# Patient Record
Sex: Female | Born: 1935
Health system: Southern US, Community
[De-identification: ages and names within clinical notes are randomized; demographics above are authoritative.]

## PROBLEM LIST (undated history)

## (undated) DIAGNOSIS — K219 Gastro-esophageal reflux disease without esophagitis: Secondary | ICD-10-CM

## (undated) DIAGNOSIS — E785 Hyperlipidemia, unspecified: Secondary | ICD-10-CM

## (undated) DIAGNOSIS — I10 Essential (primary) hypertension: Secondary | ICD-10-CM

## (undated) DIAGNOSIS — D649 Anemia, unspecified: Secondary | ICD-10-CM

## (undated) DIAGNOSIS — H35039 Hypertensive retinopathy, unspecified eye: Secondary | ICD-10-CM

## (undated) DIAGNOSIS — I2510 Atherosclerotic heart disease of native coronary artery without angina pectoris: Secondary | ICD-10-CM

## (undated) DIAGNOSIS — I739 Peripheral vascular disease, unspecified: Secondary | ICD-10-CM

## (undated) DIAGNOSIS — E119 Type 2 diabetes mellitus without complications: Secondary | ICD-10-CM

## (undated) DIAGNOSIS — H3530 Unspecified macular degeneration: Secondary | ICD-10-CM

## (undated) DIAGNOSIS — I779 Disorder of arteries and arterioles, unspecified: Secondary | ICD-10-CM

## (undated) HISTORY — DX: Essential (primary) hypertension: I10

## (undated) HISTORY — DX: Type 2 diabetes mellitus without complications: E11.9

## (undated) HISTORY — PX: ABDOMINAL HYSTERECTOMY: SHX81

## (undated) HISTORY — PX: EYE SURGERY: SHX253

## (undated) HISTORY — DX: Gastro-esophageal reflux disease without esophagitis: K21.9

## (undated) HISTORY — PX: OTHER SURGICAL HISTORY: SHX169

## (undated) HISTORY — PX: CATARACT EXTRACTION: SUR2

## (undated) HISTORY — DX: Anemia, unspecified: D64.9

## (undated) HISTORY — DX: Hyperlipidemia, unspecified: E78.5

## (undated) HISTORY — PX: APPENDECTOMY: SHX54

## (undated) HISTORY — DX: Unspecified macular degeneration: H35.30

## (undated) HISTORY — DX: Hypertensive retinopathy, unspecified eye: H35.039

---

## 1999-09-07 ENCOUNTER — Encounter: Payer: Self-pay | Admitting: Internal Medicine

## 1999-09-07 ENCOUNTER — Encounter: Admission: RE | Admit: 1999-09-07 | Discharge: 1999-09-07 | Payer: Self-pay | Admitting: Internal Medicine

## 2001-04-06 ENCOUNTER — Encounter: Admission: RE | Admit: 2001-04-06 | Discharge: 2001-04-06 | Payer: Self-pay | Admitting: Internal Medicine

## 2001-04-06 ENCOUNTER — Encounter: Payer: Self-pay | Admitting: Internal Medicine

## 2001-05-04 ENCOUNTER — Other Ambulatory Visit: Admission: RE | Admit: 2001-05-04 | Discharge: 2001-05-04 | Payer: Self-pay | Admitting: Internal Medicine

## 2002-03-28 ENCOUNTER — Encounter: Payer: Self-pay | Admitting: Internal Medicine

## 2002-03-28 ENCOUNTER — Encounter: Admission: RE | Admit: 2002-03-28 | Discharge: 2002-03-28 | Payer: Self-pay | Admitting: Family Medicine

## 2003-04-26 ENCOUNTER — Encounter: Admission: RE | Admit: 2003-04-26 | Discharge: 2003-04-26 | Payer: Self-pay | Admitting: Internal Medicine

## 2003-10-22 ENCOUNTER — Emergency Department (HOSPITAL_COMMUNITY): Admission: EM | Admit: 2003-10-22 | Discharge: 2003-10-22 | Payer: Self-pay | Admitting: Emergency Medicine

## 2004-05-21 ENCOUNTER — Encounter: Admission: RE | Admit: 2004-05-21 | Discharge: 2004-05-21 | Payer: Self-pay | Admitting: Internal Medicine

## 2005-05-24 ENCOUNTER — Encounter: Admission: RE | Admit: 2005-05-24 | Discharge: 2005-05-24 | Payer: Self-pay | Admitting: Internal Medicine

## 2006-05-25 ENCOUNTER — Encounter: Admission: RE | Admit: 2006-05-25 | Discharge: 2006-05-25 | Payer: Self-pay | Admitting: Internal Medicine

## 2007-05-29 ENCOUNTER — Encounter: Admission: RE | Admit: 2007-05-29 | Discharge: 2007-05-29 | Payer: Self-pay | Admitting: Internal Medicine

## 2007-11-15 ENCOUNTER — Ambulatory Visit: Payer: Self-pay | Admitting: Cardiology

## 2007-11-15 ENCOUNTER — Inpatient Hospital Stay (HOSPITAL_COMMUNITY): Admission: EM | Admit: 2007-11-15 | Discharge: 2007-11-17 | Payer: Self-pay | Admitting: Emergency Medicine

## 2007-11-22 ENCOUNTER — Ambulatory Visit: Payer: Self-pay

## 2007-11-29 ENCOUNTER — Ambulatory Visit: Payer: Self-pay | Admitting: Cardiology

## 2007-12-15 ENCOUNTER — Ambulatory Visit: Payer: Self-pay

## 2008-06-04 ENCOUNTER — Encounter: Admission: RE | Admit: 2008-06-04 | Discharge: 2008-06-04 | Payer: Self-pay | Admitting: Internal Medicine

## 2008-07-16 ENCOUNTER — Observation Stay (HOSPITAL_COMMUNITY): Admission: EM | Admit: 2008-07-16 | Discharge: 2008-07-17 | Payer: Self-pay | Admitting: Emergency Medicine

## 2008-07-16 ENCOUNTER — Ambulatory Visit: Payer: Self-pay | Admitting: Cardiology

## 2008-07-17 ENCOUNTER — Encounter (INDEPENDENT_AMBULATORY_CARE_PROVIDER_SITE_OTHER): Payer: Self-pay | Admitting: Internal Medicine

## 2008-07-23 DIAGNOSIS — I10 Essential (primary) hypertension: Secondary | ICD-10-CM | POA: Insufficient documentation

## 2008-07-23 DIAGNOSIS — K219 Gastro-esophageal reflux disease without esophagitis: Secondary | ICD-10-CM | POA: Insufficient documentation

## 2008-07-23 DIAGNOSIS — E119 Type 2 diabetes mellitus without complications: Secondary | ICD-10-CM | POA: Insufficient documentation

## 2008-07-23 DIAGNOSIS — M810 Age-related osteoporosis without current pathological fracture: Secondary | ICD-10-CM | POA: Insufficient documentation

## 2008-07-23 DIAGNOSIS — R0789 Other chest pain: Secondary | ICD-10-CM | POA: Insufficient documentation

## 2008-07-23 DIAGNOSIS — H3530 Unspecified macular degeneration: Secondary | ICD-10-CM | POA: Insufficient documentation

## 2008-07-23 DIAGNOSIS — E785 Hyperlipidemia, unspecified: Secondary | ICD-10-CM | POA: Insufficient documentation

## 2008-07-23 DIAGNOSIS — Z862 Personal history of diseases of the blood and blood-forming organs and certain disorders involving the immune mechanism: Secondary | ICD-10-CM | POA: Insufficient documentation

## 2008-07-23 DIAGNOSIS — Z8639 Personal history of other endocrine, nutritional and metabolic disease: Secondary | ICD-10-CM

## 2008-07-24 ENCOUNTER — Encounter: Payer: Self-pay | Admitting: Physician Assistant

## 2008-07-24 ENCOUNTER — Ambulatory Visit: Payer: Self-pay | Admitting: Cardiology

## 2008-11-11 ENCOUNTER — Encounter: Payer: Self-pay | Admitting: Cardiology

## 2008-12-12 ENCOUNTER — Encounter: Payer: Self-pay | Admitting: Cardiology

## 2008-12-18 ENCOUNTER — Ambulatory Visit: Payer: Self-pay

## 2008-12-18 ENCOUNTER — Encounter: Payer: Self-pay | Admitting: Cardiology

## 2008-12-20 ENCOUNTER — Ambulatory Visit: Payer: Self-pay | Admitting: Oncology

## 2008-12-25 ENCOUNTER — Ambulatory Visit: Payer: Self-pay | Admitting: Cardiology

## 2008-12-25 DIAGNOSIS — I080 Rheumatic disorders of both mitral and aortic valves: Secondary | ICD-10-CM | POA: Insufficient documentation

## 2008-12-25 DIAGNOSIS — I6529 Occlusion and stenosis of unspecified carotid artery: Secondary | ICD-10-CM | POA: Insufficient documentation

## 2009-01-06 LAB — CBC WITH DIFFERENTIAL/PLATELET
BASO%: 0.4 % (ref 0.0–2.0)
Eosinophils Absolute: 0.1 10*3/uL (ref 0.0–0.5)
LYMPH%: 16.4 % (ref 14.0–49.7)
MCHC: 34.2 g/dL (ref 31.5–36.0)
MONO#: 0.5 10*3/uL (ref 0.1–0.9)
NEUT#: 3.9 10*3/uL (ref 1.5–6.5)
Platelets: 139 10*3/uL — ABNORMAL LOW (ref 145–400)
RBC: 3.5 10*6/uL — ABNORMAL LOW (ref 3.70–5.45)
RDW: 14.6 % — ABNORMAL HIGH (ref 11.2–14.5)
WBC: 5.4 10*3/uL (ref 3.9–10.3)
lymph#: 0.9 10*3/uL (ref 0.9–3.3)

## 2009-01-06 LAB — CHCC SMEAR

## 2009-01-07 LAB — COMPREHENSIVE METABOLIC PANEL
ALT: 16 U/L (ref 0–35)
AST: 23 U/L (ref 0–37)
BUN: 37 mg/dL — ABNORMAL HIGH (ref 6–23)
CO2: 31 mEq/L (ref 19–32)
Calcium: 10.2 mg/dL (ref 8.4–10.5)
Chloride: 101 mEq/L (ref 96–112)
Creatinine, Ser: 1.41 mg/dL — ABNORMAL HIGH (ref 0.40–1.20)
Total Bilirubin: 0.5 mg/dL (ref 0.3–1.2)

## 2009-01-07 LAB — VITAMIN B12: Vitamin B-12: 523 pg/mL (ref 211–911)

## 2009-01-07 LAB — ANA: Anti Nuclear Antibody(ANA): NEGATIVE

## 2009-01-07 LAB — LACTATE DEHYDROGENASE: LDH: 225 U/L (ref 94–250)

## 2009-01-09 LAB — IRON AND TIBC
%SAT: 15 % — ABNORMAL LOW (ref 20–55)
Iron: 58 ug/dL (ref 42–145)
TIBC: 395 ug/dL (ref 250–470)

## 2009-01-09 LAB — FERRITIN: Ferritin: 49 ng/mL (ref 10–291)

## 2009-02-03 ENCOUNTER — Ambulatory Visit: Payer: Self-pay | Admitting: Oncology

## 2009-02-05 LAB — COMPREHENSIVE METABOLIC PANEL
AST: 18 U/L (ref 0–37)
BUN: 31 mg/dL — ABNORMAL HIGH (ref 6–23)
Calcium: 9.6 mg/dL (ref 8.4–10.5)
Chloride: 102 mEq/L (ref 96–112)
Creatinine, Ser: 1.43 mg/dL — ABNORMAL HIGH (ref 0.40–1.20)
Glucose, Bld: 80 mg/dL (ref 70–99)

## 2009-02-05 LAB — CBC & DIFF AND RETIC
BASO%: 0.6 % (ref 0.0–2.0)
EOS%: 3.8 % (ref 0.0–7.0)
MCH: 28.1 pg (ref 25.1–34.0)
MCHC: 31.7 g/dL (ref 31.5–36.0)
MONO%: 10.2 % (ref 0.0–14.0)
NEUT%: 46.9 % (ref 38.4–76.8)
RDW: 14.7 % — ABNORMAL HIGH (ref 11.2–14.5)
Retic %: 1.36 % (ref 0.50–1.50)
Retic Ct Abs: 43.52 10*3/uL (ref 18.30–72.70)
lymph#: 1.3 10*3/uL (ref 0.9–3.3)

## 2009-02-05 LAB — SEDIMENTATION RATE: Sed Rate: 32 mm/hr — ABNORMAL HIGH (ref 0–22)

## 2009-02-20 LAB — CBC WITH DIFFERENTIAL/PLATELET
Eosinophils Absolute: 0.1 10*3/uL (ref 0.0–0.5)
MONO#: 0.3 10*3/uL (ref 0.1–0.9)
NEUT#: 1.4 10*3/uL — ABNORMAL LOW (ref 1.5–6.5)
Platelets: 125 10*3/uL — ABNORMAL LOW (ref 145–400)
RBC: 3.22 10*6/uL — ABNORMAL LOW (ref 3.70–5.45)
RDW: 15.4 % — ABNORMAL HIGH (ref 11.2–14.5)
WBC: 3 10*3/uL — ABNORMAL LOW (ref 3.9–10.3)

## 2009-03-06 ENCOUNTER — Ambulatory Visit: Payer: Self-pay | Admitting: Oncology

## 2009-03-10 LAB — CBC & DIFF AND RETIC
BASO%: 0.9 % (ref 0.0–2.0)
EOS%: 3.2 % (ref 0.0–7.0)
LYMPH%: 39.8 % (ref 14.0–49.7)
MCH: 28.4 pg (ref 25.1–34.0)
MCHC: 31.5 g/dL (ref 31.5–36.0)
MCV: 90 fL (ref 79.5–101.0)
MONO%: 7.5 % (ref 0.0–14.0)
NEUT#: 1.7 10*3/uL (ref 1.5–6.5)
Platelets: 126 10*3/uL — ABNORMAL LOW (ref 145–400)
RBC: 3.49 10*6/uL — ABNORMAL LOW (ref 3.70–5.45)
RDW: 14.6 % — ABNORMAL HIGH (ref 11.2–14.5)
Retic %: 0.99 % (ref 0.50–1.50)
nRBC: 0 % (ref 0–0)

## 2009-03-11 LAB — COMPREHENSIVE METABOLIC PANEL
Alkaline Phosphatase: 56 U/L (ref 39–117)
BUN: 24 mg/dL — ABNORMAL HIGH (ref 6–23)
CO2: 31 mEq/L (ref 19–32)
Glucose, Bld: 86 mg/dL (ref 70–99)
Sodium: 143 mEq/L (ref 135–145)
Total Bilirubin: 0.5 mg/dL (ref 0.3–1.2)
Total Protein: 7 g/dL (ref 6.0–8.3)

## 2009-03-11 LAB — ERYTHROPOIETIN: Erythropoietin: 29.7 m[IU]/mL (ref 2.6–34.0)

## 2009-03-11 LAB — LACTATE DEHYDROGENASE: LDH: 198 U/L (ref 94–250)

## 2009-03-11 LAB — SEDIMENTATION RATE: Sed Rate: 27 mm/hr — ABNORMAL HIGH (ref 0–22)

## 2009-04-03 ENCOUNTER — Ambulatory Visit: Payer: Self-pay | Admitting: Oncology

## 2009-04-07 LAB — CBC WITH DIFFERENTIAL/PLATELET
EOS%: 3.9 % (ref 0.0–7.0)
Eosinophils Absolute: 0.1 10*3/uL (ref 0.0–0.5)
MCV: 88.5 fL (ref 79.5–101.0)
MONO%: 10 % (ref 0.0–14.0)
NEUT#: 1.7 10*3/uL (ref 1.5–6.5)
RBC: 3.42 10*6/uL — ABNORMAL LOW (ref 3.70–5.45)
RDW: 14.5 % (ref 11.2–14.5)
lymph#: 1.4 10*3/uL (ref 0.9–3.3)

## 2009-05-01 ENCOUNTER — Ambulatory Visit: Payer: Self-pay | Admitting: Oncology

## 2009-05-02 ENCOUNTER — Encounter (INDEPENDENT_AMBULATORY_CARE_PROVIDER_SITE_OTHER): Payer: Self-pay | Admitting: *Deleted

## 2009-05-05 LAB — COMPREHENSIVE METABOLIC PANEL
ALT: 16 U/L (ref 0–35)
AST: 21 U/L (ref 0–37)
Alkaline Phosphatase: 56 U/L (ref 39–117)
BUN: 33 mg/dL — ABNORMAL HIGH (ref 6–23)
Calcium: 10.6 mg/dL — ABNORMAL HIGH (ref 8.4–10.5)
Chloride: 98 mEq/L (ref 96–112)
Creatinine, Ser: 1.27 mg/dL — ABNORMAL HIGH (ref 0.40–1.20)
Total Bilirubin: 0.4 mg/dL (ref 0.3–1.2)

## 2009-05-05 LAB — CBC WITH DIFFERENTIAL/PLATELET
BASO%: 0.5 % (ref 0.0–2.0)
EOS%: 2.2 % (ref 0.0–7.0)
HCT: 32.8 % — ABNORMAL LOW (ref 34.8–46.6)
MCH: 27.5 pg (ref 25.1–34.0)
MCHC: 31.4 g/dL — ABNORMAL LOW (ref 31.5–36.0)
MCV: 87.5 fL (ref 79.5–101.0)
MONO%: 9 % (ref 0.0–14.0)
NEUT%: 58.2 % (ref 38.4–76.8)
lymph#: 1.2 10*3/uL (ref 0.9–3.3)

## 2009-05-05 LAB — SEDIMENTATION RATE: Sed Rate: 40 mm/hr — ABNORMAL HIGH (ref 0–22)

## 2009-06-05 ENCOUNTER — Encounter: Admission: RE | Admit: 2009-06-05 | Discharge: 2009-06-05 | Payer: Self-pay | Admitting: Internal Medicine

## 2009-06-19 ENCOUNTER — Ambulatory Visit: Payer: Self-pay | Admitting: Cardiology

## 2009-06-30 ENCOUNTER — Ambulatory Visit: Payer: Self-pay | Admitting: Oncology

## 2009-07-03 LAB — CBC WITH DIFFERENTIAL/PLATELET
Eosinophils Absolute: 0.1 10*3/uL (ref 0.0–0.5)
LYMPH%: 38.3 % (ref 14.0–49.7)
MONO#: 0.3 10*3/uL (ref 0.1–0.9)
NEUT#: 1.7 10*3/uL (ref 1.5–6.5)
Platelets: 127 10*3/uL — ABNORMAL LOW (ref 145–400)
RBC: 3.44 10*6/uL — ABNORMAL LOW (ref 3.70–5.45)
RDW: 15.1 % — ABNORMAL HIGH (ref 11.2–14.5)
WBC: 3.4 10*3/uL — ABNORMAL LOW (ref 3.9–10.3)
lymph#: 1.3 10*3/uL (ref 0.9–3.3)

## 2009-07-10 ENCOUNTER — Ambulatory Visit: Payer: Self-pay | Admitting: Internal Medicine

## 2009-07-10 ENCOUNTER — Telehealth (INDEPENDENT_AMBULATORY_CARE_PROVIDER_SITE_OTHER): Payer: Self-pay | Admitting: *Deleted

## 2009-07-10 ENCOUNTER — Observation Stay (HOSPITAL_COMMUNITY): Admission: EM | Admit: 2009-07-10 | Discharge: 2009-07-11 | Payer: Self-pay | Admitting: Emergency Medicine

## 2009-07-14 ENCOUNTER — Encounter (INDEPENDENT_AMBULATORY_CARE_PROVIDER_SITE_OTHER): Payer: Self-pay | Admitting: *Deleted

## 2009-07-14 ENCOUNTER — Ambulatory Visit: Payer: Self-pay

## 2009-07-14 ENCOUNTER — Ambulatory Visit: Payer: Self-pay | Admitting: Internal Medicine

## 2009-07-14 ENCOUNTER — Ambulatory Visit (HOSPITAL_COMMUNITY): Admission: RE | Admit: 2009-07-14 | Discharge: 2009-07-14 | Payer: Self-pay | Admitting: Cardiology

## 2009-07-14 ENCOUNTER — Encounter (HOSPITAL_COMMUNITY): Admission: RE | Admit: 2009-07-14 | Discharge: 2009-09-17 | Payer: Self-pay | Admitting: Cardiology

## 2009-07-14 ENCOUNTER — Encounter: Payer: Self-pay | Admitting: Cardiology

## 2009-07-22 ENCOUNTER — Telehealth: Payer: Self-pay | Admitting: Cardiology

## 2009-08-11 ENCOUNTER — Emergency Department (HOSPITAL_COMMUNITY): Admission: EM | Admit: 2009-08-11 | Discharge: 2009-08-11 | Payer: Self-pay | Admitting: Emergency Medicine

## 2009-08-12 ENCOUNTER — Ambulatory Visit: Payer: Self-pay | Admitting: Cardiology

## 2009-08-12 DIAGNOSIS — R55 Syncope and collapse: Secondary | ICD-10-CM | POA: Insufficient documentation

## 2009-09-01 ENCOUNTER — Ambulatory Visit: Payer: Self-pay | Admitting: Oncology

## 2009-09-02 LAB — COMPREHENSIVE METABOLIC PANEL
Albumin: 4.2 g/dL (ref 3.5–5.2)
Alkaline Phosphatase: 54 U/L (ref 39–117)
BUN: 42 mg/dL — ABNORMAL HIGH (ref 6–23)
Glucose, Bld: 93 mg/dL (ref 70–99)
Potassium: 4.7 mEq/L (ref 3.5–5.3)

## 2009-09-02 LAB — CBC WITH DIFFERENTIAL/PLATELET
Basophils Absolute: 0 10*3/uL (ref 0.0–0.1)
EOS%: 4.2 % (ref 0.0–7.0)
HCT: 30 % — ABNORMAL LOW (ref 34.8–46.6)
HGB: 10.1 g/dL — ABNORMAL LOW (ref 11.6–15.9)
MCH: 29.5 pg (ref 25.1–34.0)
MCV: 87.4 fL (ref 79.5–101.0)
MONO%: 10.6 % (ref 0.0–14.0)
NEUT%: 48.1 % (ref 38.4–76.8)
Platelets: 129 10*3/uL — ABNORMAL LOW (ref 145–400)
lymph#: 1.2 10*3/uL (ref 0.9–3.3)

## 2009-10-24 ENCOUNTER — Ambulatory Visit: Payer: Self-pay | Admitting: Cardiology

## 2009-12-02 ENCOUNTER — Ambulatory Visit: Payer: Self-pay | Admitting: Oncology

## 2009-12-04 LAB — CBC WITH DIFFERENTIAL/PLATELET
Basophils Absolute: 0 10*3/uL (ref 0.0–0.1)
HCT: 30 % — ABNORMAL LOW (ref 34.8–46.6)
HGB: 10.2 g/dL — ABNORMAL LOW (ref 11.6–15.9)
MCV: 87.4 fL (ref 79.5–101.0)
MONO#: 0.4 10*3/uL (ref 0.1–0.9)
NEUT%: 58 % (ref 38.4–76.8)
RDW: 15.1 % — ABNORMAL HIGH (ref 11.2–14.5)
WBC: 3.7 10*3/uL — ABNORMAL LOW (ref 3.9–10.3)
lymph#: 1.1 10*3/uL (ref 0.9–3.3)

## 2009-12-24 ENCOUNTER — Ambulatory Visit: Payer: Self-pay

## 2009-12-24 ENCOUNTER — Encounter: Payer: Self-pay | Admitting: Cardiology

## 2010-03-04 ENCOUNTER — Ambulatory Visit: Payer: Self-pay | Admitting: Oncology

## 2010-03-06 LAB — LACTATE DEHYDROGENASE: LDH: 188 U/L (ref 94–250)

## 2010-03-06 LAB — CBC WITH DIFFERENTIAL/PLATELET
Basophils Absolute: 0 10*3/uL (ref 0.0–0.1)
Eosinophils Absolute: 0.1 10*3/uL (ref 0.0–0.5)
HCT: 32.9 % — ABNORMAL LOW (ref 34.8–46.6)
HGB: 11.2 g/dL — ABNORMAL LOW (ref 11.6–15.9)
LYMPH%: 32 % (ref 14.0–49.7)
MONO#: 0.3 10*3/uL (ref 0.1–0.9)
NEUT#: 1.7 10*3/uL (ref 1.5–6.5)
NEUT%: 52.6 % (ref 38.4–76.8)
Platelets: 145 10*3/uL (ref 145–400)
WBC: 3.2 10*3/uL — ABNORMAL LOW (ref 3.9–10.3)

## 2010-03-06 LAB — SEDIMENTATION RATE: Sed Rate: 17 mm/hr (ref 0–22)

## 2010-03-06 LAB — COMPREHENSIVE METABOLIC PANEL
CO2: 33 mEq/L — ABNORMAL HIGH (ref 19–32)
Calcium: 10.3 mg/dL (ref 8.4–10.5)
Chloride: 98 mEq/L (ref 96–112)
Creatinine, Ser: 1.22 mg/dL — ABNORMAL HIGH (ref 0.40–1.20)
Glucose, Bld: 97 mg/dL (ref 70–99)
Total Bilirubin: 0.5 mg/dL (ref 0.3–1.2)

## 2010-03-08 ENCOUNTER — Inpatient Hospital Stay (HOSPITAL_COMMUNITY)
Admission: EM | Admit: 2010-03-08 | Discharge: 2010-03-10 | Payer: Self-pay | Source: Home / Self Care | Admitting: Emergency Medicine

## 2010-03-08 ENCOUNTER — Encounter: Payer: Self-pay | Admitting: Cardiology

## 2010-03-13 ENCOUNTER — Ambulatory Visit: Payer: Self-pay | Admitting: Cardiology

## 2010-04-10 ENCOUNTER — Encounter: Payer: Self-pay | Admitting: Cardiology

## 2010-04-15 ENCOUNTER — Ambulatory Visit: Payer: Self-pay | Admitting: Cardiology

## 2010-04-16 ENCOUNTER — Telehealth (INDEPENDENT_AMBULATORY_CARE_PROVIDER_SITE_OTHER): Payer: Self-pay | Admitting: *Deleted

## 2010-05-09 ENCOUNTER — Other Ambulatory Visit: Payer: Self-pay | Admitting: Internal Medicine

## 2010-05-09 DIAGNOSIS — Z1239 Encounter for other screening for malignant neoplasm of breast: Secondary | ICD-10-CM

## 2010-05-21 NOTE — Assessment & Plan Note (Signed)
Summary: eph/jml   History of Present Illness: Pleasant female that I have seen in the past for chest pain and near syncope. Previous cardiac catheterization in 1991 apparently okay per patient. Cardiolite in 2003 showed no scar or ischemia and ejection fraction of 73%.  Last carotid Dopplers performed in September of 2011 revealed 40-59% right and 0-39% left stenosis and followup was recommended in one year. Myoview performed in March of 2011. Ejection fraction was 70%. There was mild apical thinning but no ischemia. An echocardiogram was also performed in March of 2011. Ejection fraction was normal. There was mild aortic and mitral regurgitation. Patient admitted in November of 2011 following a near syncopal episode that was felt to be vagal in etiology. A CardioNet monitor was performed as an outpatient and revealed . The patient states that she was at church and suddenly felt dizzy followed by nausea and diaphoresis. She had near syncope but did not completely lose consciousness. There was no associated palpitations, chest pain or shortness of breath. She has had no problems since then and denies any dyspnea on exertion, orthopnea, PND, palpitations or chest pain.  Current Medications (verified): 1)  Lisinopril 5 Mg Tabs (Lisinopril) .... Take One Tablet By Mouth Daily 2)  Bisoprolol Fumarate 5 Mg Tabs (Bisoprolol Fumarate) .... 1/2 Tab Po Once Daily 3)  Simvastatin 20 Mg Tabs (Simvastatin) .... Take One Daily 4)  Aspirin 81 Mg  Tbec (Aspirin) .... One By Mouth Every Day 5)  Actos 15 Mg Tabs (Pioglitazone Hcl) .... Take One Daily 6)  Centrum Silver  Tabs (Multiple Vitamins-Minerals) .... Take One Daily 7)  Ocuvite Preservision  Tabs (Multiple Vitamins-Minerals) .Marland Kitchen.. 1 Tab By Mouth Once Daily 8)  Diazepam 5 Mg Tabs (Diazepam) .... Take As Needed 9)  Allopurinol 100 Mg Tabs (Allopurinol) .Marland Kitchen.. 1 Tab By Mouth Once Daily 10)  Mobic 7.5 Mg Tabs (Meloxicam) .... Take As Needed 11)  Avasta Eye Injection  .... Injection Every 8 Weeks 12)  Caltrate 600 1500 Mg Tabs (Calcium Carbonate) .Marland Kitchen.. 1 Tab By Mouth Once Daily 13)  Cinnamon 500 Mg Caps (Cinnamon) .Marland Kitchen.. 1 Tab By Mouth Two Times A Day 14)  Vigamox 0.5 % Soln (Moxifloxacin Hcl) .... As Directed 15)  Systane 0.4-0.3 % Soln (Polyethyl Glycol-Propyl Glycol) .... As Directed 16)  Geritol Complete  Tabs (Iron-Vitamins) .Marland Kitchen.. 1 Tab By Mouth Once Daily 17)  Meclizine Hcl 12.5 Mg Tabs (Meclizine Hcl) .... As Needed  Allergies: 1)  ! Sulfa 2)  ! Floxin 3)  ! * Tequin 4)  ! Hydrocodone  Past History:  Past Medical History: GASTROESOPHAGEAL REFLUX DISEASE (ICD-530.81) HYPERLIPIDEMIA (ICD-272.4) HYPERTENSION (ICD-401.9) DIABETES MELLITUS, TYPE II (ICD-250.00) MACULAR DEGENERATION (ICD-362.50) GOUT, HX OF (ICD-V12.2) OSTEOPOROSIS (ICD-733.00) Anemia  Past Surgical History: Reviewed history from 07/23/2008 and no changes required.  appendectomy, hysterectomy and abdominal   cyst removal.   Social History: Reviewed history from 07/23/2008 and no changes required.  Lives in [place] and lives alone, retired from   [employer] work, denies any alcohol, tobacco or drug use.   Review of Systems       no fevers or chills, productive cough, hemoptysis, dysphasia, odynophagia, melena, hematochezia, dysuria, hematuria, rash, seizure activity, orthopnea, PND, pedal edema, claudication. Remaining systems are negative.   Vital Signs:  Patient profile:   77 year old female Height:      63 inches Weight:      134 pounds BMI:     23.82 Pulse rate:   54 / minute Resp:  14 per minute BP sitting:   144 / 55  (left arm)  Vitals Entered By: Kem Parkinson (April 15, 2010 8:28 AM)  Physical Exam  General:  Well-developed well-nourished in no acute distress.  Skin is warm and dry.  HEENT is normal.  Neck is supple. No thyromegaly.  Chest is clear to auscultation with normal expansion.  Cardiovascular exam is regular rate and rhythm.    Abdominal exam nontender or distended. No masses palpated. Extremities show no edema. neuro grossly intact    EKG  Procedure date:  03/08/2010  Findings:      Sinus bradycardia at a rate of 56. No ST changes.  Impression & Recommendations:  Problem # 1:  CAROTID STENOSIS (ICD-433.10) Continued aspirin and statin. Followup carotid Dopplers September 2012. Her updated medication list for this problem includes:    Aspirin 81 Mg Tbec (Aspirin) ..... One by mouth every day  Problem # 2:  SYNCOPE (ICD-780.2) Episode in November sounds to be vagally mediated. No further episodes. We will follow expectantly. Her updated medication list for this problem includes:    Lisinopril 5 Mg Tabs (Lisinopril) .Marland Kitchen... Take one tablet by mouth daily    Bisoprolol Fumarate 5 Mg Tabs (Bisoprolol fumarate) .Marland Kitchen... 1/2 tab po once daily    Aspirin 81 Mg Tbec (Aspirin) ..... One by mouth every day  Problem # 3:  HYPERLIPIDEMIA (ICD-272.4) Continue statin. Lipids and liver monitored by primary care. Her updated medication list for this problem includes:    Simvastatin 20 Mg Tabs (Simvastatin) .Marland Kitchen... Take one daily  Problem # 4:  HYPERTENSION (ICD-401.9) Continue present medications. Blood pressure controlled. Her updated medication list for this problem includes:    Lisinopril 5 Mg Tabs (Lisinopril) .Marland Kitchen... Take one tablet by mouth daily    Bisoprolol Fumarate 5 Mg Tabs (Bisoprolol fumarate) .Marland Kitchen... 1/2 tab po once daily    Aspirin 81 Mg Tbec (Aspirin) ..... One by mouth every day  Problem # 5:  DIABETES MELLITUS, TYPE II (ICD-250.00)  Her updated medication list for this problem includes:    Lisinopril 5 Mg Tabs (Lisinopril) .Marland Kitchen... Take one tablet by mouth daily    Aspirin 81 Mg Tbec (Aspirin) ..... One by mouth every day    Actos 15 Mg Tabs (Pioglitazone hcl) .Marland Kitchen... Take one daily  Patient Instructions: 1)  Your physician wants you to follow-up in: 6 MONTHS  You will receive a reminder letter in the  mail two months in advance. If you don't receive a letter, please call our office to schedule the follow-up appointment. Prescriptions: BISOPROLOL FUMARATE 5 MG TABS (BISOPROLOL FUMARATE) 1/2 tab po once daily  #90 x 3   Entered by:   Deliah Goody, RN   Authorized by:   Ferman Hamming, MD, [facility]   Signed by:   Deliah Goody, RN on 04/15/2010   Method used:   Electronically to        [facility]  [address] [phone]* (retail)       [address]       [place], [place]  [address]       Ph: [phone] or [phone]       Fax: [phone]   RxID:   [id number] LISINOPRIL 5 MG TABS (LISINOPRIL) Take one tablet by mouth daily  #90 x 3   Entered by:   Deliah Goody, RN   Authorized by:   Ferman Hamming, MD, [facility]   Signed by:  Deliah Goody, RN on 04/15/2010   Method used:   Electronically to        [facility]  [address] [phone]* (retail)       [address]       [place], [place]  [address]       Ph: [phone] or [phone]       Fax: [phone]   RxID:   [id number]

## 2010-05-21 NOTE — Progress Notes (Signed)
  Phone Note Outgoing Call   Call placed by: Scherrie Bateman, LPN,  April 16, 2010 3:41 PM Call placed to: Patient Summary of Call: PT AWARE PER DR CRENSHAW MONITOR SHOWS SINUS/BRADY, SINUS ARRTHYMIA, AND PAC'S. Initial call taken by: Scherrie Bateman, LPN,  April 16, 2010 3:42 PM

## 2010-05-21 NOTE — Assessment & Plan Note (Signed)
Summary: per check out/sf   CC:  dizziness .  History of Present Illness: Pleasant female that I have seen in the past for chest pain. Previous cardiac catheterization in 1991 apparently okay per patient. Cardiolite in 2003 showed no scar or ischemia and ejection fraction of 73%.  Last carotid Dopplers performed in September of 2010 revealed 0-39% stenosis and followup was recommended in one year. She was recently admitted to [facility] with a syncopal episode. Her diuretics were discontinued and she had an outpatient Myoview performed in March of 2011. Ejection fraction was 70%. There was mild apical thinning but no ischemia. An echocardiogram was also performed in March of 2011. Ejection fraction was normal. There was mild aortic and mitral regurgitation. I last saw her in April of 2011. Since then her dizziness with standing has much improved. Note we did decrease her blood pressure medications previously. She has not had any further syncope. She had dyspnea with more extreme activities but not with routine activities. It is relieved with rest. There is no associated chest pain.  Current Medications (verified): 1)  Lisinopril 5 Mg Tabs (Lisinopril) .... Take One Tablet By Mouth Daily 2)  Bisoprolol Fumarate 5 Mg Tabs (Bisoprolol Fumarate) .... 1/2 Tab Po Once Daily 3)  Simvastatin 20 Mg Tabs (Simvastatin) .... Take One Daily 4)  Aspirin 81 Mg  Tbec (Aspirin) .... One By Mouth Every Day 5)  Actos 15 Mg Tabs (Pioglitazone Hcl) .... Take One Daily 6)  Centrum Silver  Tabs (Multiple Vitamins-Minerals) .... Take One Daily 7)  Ocuvite Preservision  Tabs (Multiple Vitamins-Minerals) .Marland Kitchen.. 1 Tab By Mouth Once Daily 8)  Diazepam 5 Mg Tabs (Diazepam) .... Take As Needed 9)  Allopurinol 100 Mg Tabs (Allopurinol) .Marland Kitchen.. 1 Tab By Mouth Once Daily 10)  Mobic 7.5 Mg Tabs (Meloxicam) .... Take As Needed 11)  Avasta Eye Injection .... Injection Every 8 Weeks 12)  Caltrate 600 1500 Mg Tabs (Calcium  Carbonate) .Marland Kitchen.. 1 Tab By Mouth Once Daily 13)  Cinnamon 500 Mg Caps (Cinnamon) .Marland Kitchen.. 1 Tab By Mouth Two Times A Day 14)  Vigamox 0.5 % Soln (Moxifloxacin Hcl) .... As Directed 15)  Systane 0.4-0.3 % Soln (Polyethyl Glycol-Propyl Glycol) .... As Directed 16)  Geritol Complete  Tabs (Iron-Vitamins) .Marland Kitchen.. 1 Tab By Mouth Once Daily 17)  Meclizine Hcl 12.5 Mg Tabs (Meclizine Hcl) .... As Needed  Allergies: 1)  ! Sulfa 2)  ! Floxin 3)  ! * Tequin 4)  ! Hydrocodone  Vital Signs:  Patient profile:   77 year old female Height:      63 inches Weight:      139 pounds BMI:     24.71 Pulse rate:   55 / minute Resp:     14 per minute BP sitting:   132 / 52  (left arm)  Vitals Entered By: Kem Parkinson (October 24, 2009 10:02 AM)   Impression & Recommendations:  Problem # 1:  SYNCOPE (ICD-780.2) Symptoms previously sounded to be orthostatic. They have improved with reducing blood pressure medications. No change at this point. If symptoms worsen we will decrease blood pressure medications further. Her updated medication list for this problem includes:    Lisinopril 5 Mg Tabs (Lisinopril) .Marland Kitchen... Take one tablet by mouth daily    Bisoprolol Fumarate 5 Mg Tabs (Bisoprolol fumarate) .Marland Kitchen... 1/2 tab po once daily    Aspirin 81 Mg Tbec (Aspirin) ..... One by mouth every day  Problem # 2:  CAROTID STENOSIS (ICD-433.10)  Continue aspirin and statin. Followup carotid Dopplers September 2011. Her updated medication list for this problem includes:    Aspirin 81 Mg Tbec (Aspirin) ..... One by mouth every day  Orders: Carotid Duplex (Carotid Duplex)  Problem # 3:  HYPERLIPIDEMIA (ICD-272.4) Continue statin. Lipids and liver monitored by primary care. Her updated medication list for this problem includes:    Simvastatin 20 Mg Tabs (Simvastatin) .Marland Kitchen... Take one daily  Problem # 4:  HYPERTENSION (ICD-401.9) Continue present blood pressure medications. Her updated medication list for this problem  includes:    Lisinopril 5 Mg Tabs (Lisinopril) .Marland Kitchen... Take one tablet by mouth daily    Bisoprolol Fumarate 5 Mg Tabs (Bisoprolol fumarate) .Marland Kitchen... 1/2 tab po once daily    Aspirin 81 Mg Tbec (Aspirin) ..... One by mouth every day  Problem # 5:  DIABETES MELLITUS, TYPE II (ICD-250.00)  Her updated medication list for this problem includes:    Lisinopril 5 Mg Tabs (Lisinopril) .Marland Kitchen... Take one tablet by mouth daily    Aspirin 81 Mg Tbec (Aspirin) ..... One by mouth every day    Actos 15 Mg Tabs (Pioglitazone hcl) .Marland Kitchen... Take one daily  Patient Instructions: 1)  Your physician recommends that you schedule a follow-up appointment in: ONE YEAR 2)  Your physician has requested that you have a carotid duplex. This test is an ultrasound of the carotid arteries in your neck. It looks at blood flow through these arteries that supply the brain with blood. Allow one hour for this exam. There are no restrictions or special instructions.SCHEDULE IN [place]

## 2010-05-21 NOTE — Assessment & Plan Note (Signed)
Summary: Cardiology Nuclear Study  Nuclear Med Background Indications for Stress Test: Evaluation for Ischemia, Post Hospital  Indications Comments: 3/24-3/25/11 Syncope, (-) enzymes  History: Echo, Heart Catheterization, Myocardial Perfusion Study  History Comments: '91 Cath:ok per patient; '09 ZOX:WRUEAV, EF 73%; 07/14/09  Echo:EF=55-60%, mild AR, MR  Symptoms: Chest Pain, Chest Pain with Exertion, Diaphoresis, Dizziness, DOE, Light-Headedness, Nausea, Syncope  Symptoms Comments: .   Nuclear Pre-Procedure Cardiac Risk Factors: Carotid Disease, Family History - CAD, Hypertension, Lipids, NIDDM, PVD Caffeine/Decaff Intake: None NPO After: 6:00 PM Lungs: Clear IV 0.9% NS with Angio Cath: 22g     IV Site: (R) AC IV Started by: Irean Hong RN Chest Size (in) 38     Cup Size C     Height (in): 63 Weight (lb): 139 BMI: 24.71 Tech Comments: Bisoprolol held x 24 hours  Nuclear Med Study 1 or 2 day study:  1 day     Stress Test Type:  Stress Reading MD:  Dietrich Pates, MD     Referring MD:  Olga Millers, MD Resting Radionuclide:  Technetium 65m Tetrofosmin     Resting Radionuclide Dose:  11 mCi  Stress Radionuclide:  Technetium 82m Tetrofosmin     Stress Radionuclide Dose:  33 mCi   Stress Protocol Exercise Time (min):  9:00 min     Max HR:  118 bpm     Predicted Max HR:  143 bpm  Max Systolic BP: 174 mm Hg     Percent Max HR:  82.52 %     METS: 10.4 Rate Pressure Product:  40981    Stress Test Technologist:  Rea [employer] CMA-N     Nuclear Technologist:  Domenic Polite CNMT  Rest Procedure  Myocardial perfusion imaging was performed at rest 45 minutes following the intravenous administration of Myoview Technetium 22m Tetrofosmin.  Stress Procedure  Baseline EKG showed sinus bradycardia with an isolated PJC.  The patient exercised for nine minutes.  The patient stopped due to fatigue and denied any chest pain.  There were no diagnostic ST-T wave changes, only rare PVC's.   Myoview was injected at peak exercise and myocardial perfusion imaging was performed after a brief delay.  QPS Raw Data Images:  Soft tissue (diaphrgm, breast) surround heart. Stress Images:  Small apical defect, otherwise normal perfusion. Rest Images:  No significant change from the stress images. Subtraction (SDS):  No evidence of ischemia. Transient Ischemic Dilatation:  1.01  (Normal <1.22)  Lung/Heart Ratio:  .33  (Normal <0.45)  Quantitative Gated Spect Images QGS EDV:  67 ml QGS ESV:  19 ml QGS EF:  72 %   Overall Impression  Exercise Capacity: Good exercise capacity. BP Response: Normal blood pressure response. Clinical Symptoms: No chest pain ECG Impression: No significant ST segment change suggestive of ischemia. Overall Impression: Normal perfusion and mild apical thinning.  No ischemia or signifcant scar.  LVEF greater than 70%  Appended Document: Cardiology Nuclear Study ok  Appended Document: Cardiology Nuclear Study pt aware of results

## 2010-05-21 NOTE — Letter (Signed)
Summary: Appointment - Reminder 2  [facility], Main Office  [address]   [place], [place] [address]   Phone: [phone]  Fax: [phone]     May 02, 2009 MRN: [id number]   TOIYA Eagleson [address] [place], [place]  [address]   Dear Ms. Sedalia Muta,  Our records indicate that it is time to schedule a follow-up appointment. Dr.Crenshaw recommended that you follow up with [place] in March,2011 . It is very important that we reach you to schedule this appointment. We look forward to participating in your health care needs. Please contact us at the number listed above at your earliest convenience to schedule your appointment.  If you are unable to make an appointment at this time, give [place] a call so we can update our records.     Sincerely,   Glass blower/designer

## 2010-05-21 NOTE — Progress Notes (Signed)
Summary: Nuclear Pre-Procedure  Phone Note Outgoing Call   Call placed by: Milana Na, EMT-P,  July 10, 2009 3:45 PM Summary of Call: Left message with information on Myoview Information Sheet (see scanned document for details).      Nuclear Med Background Indications for Stress Test: Evaluation for Ischemia   History: Echo, Heart Catheterization, Myocardial Perfusion Study  History Comments: '91 Heart Cath ok per pt. '09 MPS NL., EF 73% 03/10 ECHO EF 60% mod. MR  Symptoms: Chest Pain, Chest Pain with Exertion  Symptoms Comments: Chest Dullness   Nuclear Pre-Procedure Cardiac Risk Factors: Carotid Disease, Family History - CAD, Hypertension, Lipids, NIDDM Height (in): 62  Nuclear Med Study Referring MD:  B.Crenshaw

## 2010-05-21 NOTE — Miscellaneous (Signed)
Summary: Outpatient Coinsurance Notice  Outpatient Coinsurance Notice   Imported By: Marylou Mccoy 07/23/2009 14:48:15  _____________________________________________________________________  External Attachment:    Type:   Image     Comment:   External Document

## 2010-05-21 NOTE — Assessment & Plan Note (Signed)
Summary: 6 MO /CY   CC:  check up.  History of Present Illness: Pleasant female that I have seen in the past for chest pain. Previous cardiac catheterization in 1991 apparently okay per patient. Cardiolite in 2003 showed no scar or ischemia and ejection fraction of 73%. Last nuclear study performed on November 22, 2007. At that time the ejection fraction was 73% with no ischemia or infarction. Last echocardiogram performed on March 31 of 2010. LV function was normal and there was moderate mitral regurgitation with mild left atrial enlargement. Last carotid Dopplers performed in September of this year revealed 0-39% stenosis and followup was recommended in one year. I last saw her in September of 2010. Since then she occasionally feels a dull sensation in her chest when she exerts herself to a significant extent. It is relieved with rest. This is similar to the pain she has had intermittently for years. No associated symptoms and it does not radiate. She has dyspnea with more extreme activities but not with routine activities. There is no orthopnea, PND, pedal edema, palpitations or syncope.  Current Medications (verified): 1)  Lisinopril 10 Mg Tabs (Lisinopril) .... Take One Daily 2)  Bisoprolol-Hydrochlorothiazide 2.5-6.25 Mg Tabs (Bisoprolol-Hydrochlorothiazide) .... Take One Daily 3)  Simvastatin 20 Mg Tabs (Simvastatin) .... Take One Daily 4)  Aspirin 81 Mg  Tbec (Aspirin) .... One By Mouth Every Day 5)  Actos 15 Mg Tabs (Pioglitazone Hcl) .... Take One Daily 6)  Centrum Silver  Tabs (Multiple Vitamins-Minerals) .... Take One Daily 7)  Ocuvite Preservision  Tabs (Multiple Vitamins-Minerals) .Marland Kitchen.. 1 Tab By Mouth Once Daily 8)  Diazepam 5 Mg Tabs (Diazepam) .... Take As Needed 9)  Allopurinol 100 Mg Tabs (Allopurinol) .Marland Kitchen.. 1 Tab By Mouth Once Daily 10)  Mobic 7.5 Mg Tabs (Meloxicam) .... Take As Needed 11)  Avasta Eye Injection .... Injection Every 8 Weeks 12)  Caltrate 600 1500 Mg Tabs (Calcium  Carbonate) .Marland Kitchen.. 1 Tab By Mouth Once Daily 13)  Cinnamon 500 Mg Caps (Cinnamon) .Marland Kitchen.. 1 Tab By Mouth Two Times A Day 14)  Vigamox 0.5 % Soln (Moxifloxacin Hcl) .... As Directed 15)  Systane 0.4-0.3 % Soln (Polyethyl Glycol-Propyl Glycol) .... As Directed 16)  Geritol Complete  Tabs (Iron-Vitamins) .Marland Kitchen.. 1 Tab By Mouth Once Daily  Allergies: 1)  ! Sulfa 2)  ! Floxin 3)  ! * Tequin 4)  ! Hydrocodone  Past History:  Past Medical History: Reviewed history from 12/25/2008 and no changes required. GASTROESOPHAGEAL REFLUX DISEASE (ICD-530.81) HYPERLIPIDEMIA (ICD-272.4) HYPERTENSION (ICD-401.9) CHEST PAIN, ATYPICAL (ICD-786.59) DIABETES MELLITUS, TYPE II (ICD-250.00) MACULAR DEGENERATION (ICD-362.50) GOUT, HX OF (ICD-V12.2) OSTEOPOROSIS (ICD-733.00) mitral regurgitation  Past Surgical History: Reviewed history from 07/23/2008 and no changes required.  appendectomy, hysterectomy and abdominal   cyst removal.   Social History: Reviewed history from 07/23/2008 and no changes required.  Lives in [place] and lives alone, retired from   [employer] work, denies any alcohol, tobacco or drug use.   Review of Systems       no fevers or chills, productive cough, hemoptysis, dysphasia, odynophagia, melena, hematochezia, dysuria, hematuria, rash, seizure activity, orthopnea, PND, pedal edema, claudication. Remaining systems are negative.   Vital Signs:  Patient profile:   77 year old female Height:      62 inches Weight:      141 pounds BMI:     25.88 Pulse rate:   60 / minute Resp:     14 per minute BP sitting:   142 /  68  (left arm)  Vitals Entered By: Kem Parkinson (June 19, 2009 9:42 AM)  Physical Exam  General:  Well-developed well-nourished in no acute distress.  Skin is warm and dry.  HEENT is normal.  Neck is supple. No thyromegaly.  Chest is clear to auscultation with normal expansion.  Cardiovascular exam is regular rate and rhythm.  Abdominal exam nontender or  distended. No masses palpated. Extremities show no edema. neuro grossly intact    EKG  Procedure date:  06/19/2009  Findings:      Sinus rhythm at a rate of 60. No ST changes. Right axis deviation.  Impression & Recommendations:  Problem # 1:  CAROTID STENOSIS (ICD-433.10) Plan followup carotid Dopplers in September of 2011. Continue aspirin and statin. Her updated medication list for this problem includes:    Aspirin 81 Mg Tbec (Aspirin) ..... One by mouth every day  Problem # 2:  MITRAL REGURGITATION (ICD-396.3) Previous echo showed moderate mitral regurgitation. Will repeat. Orders: Echocardiogram (Echo)  Problem # 3:  HYPERLIPIDEMIA (ICD-272.4) Continue statin. Lipids and liver monitored by primary care. Her updated medication list for this problem includes:    Simvastatin 20 Mg Tabs (Simvastatin) .Marland Kitchen... Take one daily  Problem # 4:  HYPERTENSION (ICD-401.9) Blood pressure controlled on present medications. Will continue. Renal function monitored by primary care. Her updated medication list for this problem includes:    Lisinopril 10 Mg Tabs (Lisinopril) .Marland Kitchen... Take one daily    Bisoprolol-hydrochlorothiazide 2.5-6.25 Mg Tabs (Bisoprolol-hydrochlorothiazide) .Marland Kitchen... Take one daily    Aspirin 81 Mg Tbec (Aspirin) ..... One by mouth every day  Problem # 5:  CHEST PAIN, ATYPICAL (ICD-786.59) Symptoms have some typical features. Plan Myoview for risk stratification. Her updated medication list for this problem includes:    Lisinopril 10 Mg Tabs (Lisinopril) .Marland Kitchen... Take one daily    Bisoprolol-hydrochlorothiazide 2.5-6.25 Mg Tabs (Bisoprolol-hydrochlorothiazide) .Marland Kitchen... Take one daily    Aspirin 81 Mg Tbec (Aspirin) ..... One by mouth every day  Orders: Nuclear Stress Test (Nuc Stress Test)  Problem # 6:  DIABETES MELLITUS, TYPE II (ICD-250.00)  Her updated medication list for this problem includes:    Lisinopril 10 Mg Tabs (Lisinopril) .Marland Kitchen... Take one daily    Aspirin 81 Mg  Tbec (Aspirin) ..... One by mouth every day    Actos 15 Mg Tabs (Pioglitazone hcl) .Marland Kitchen... Take one daily  Patient Instructions: 1)  Your physician recommends that you schedule a follow-up appointment in:9 MONTHS 2)  Your physician has requested that you have an echocardiogram.  Echocardiography is a painless test that uses sound waves to create images of your heart. It provides your doctor with information about the size and shape of your heart and how well your heart's chambers and valves are working.  This procedure takes approximately one hour. There are no restrictions for this procedure. 3)  Your physician has requested that you have an exercise stress myoview.  For further information please visit [email or web address]  Please follow instruction sheet, as given.

## 2010-05-21 NOTE — Assessment & Plan Note (Signed)
Summary: APPT @ 10:45/EPH   CC:  dizziness and swellinmg in legs.  History of Present Illness: Pleasant female that I have seen in the past for chest pain. Previous cardiac catheterization in 1991 apparently okay per patient. Cardiolite in 2003 showed no scar or ischemia and ejection fraction of 73%.  Last carotid Dopplers performed in September of this year revealed 0-39% stenosis and followup was recommended in one year. She was recently admitted to [facility] with a syncopal episode. Her diuretics were discontinued and she had an outpatient Myoview performed in March of 2011. Ejection fraction was 70%. There was mild apical thinning but no ischemia. An echocardiogram was also performed in March of 2011. Ejection fraction was normal. There was mild aortic and mitral regurgitation. Since she was discharged she was doing well. However yesterday morning she stood to go to the bathroom in the early a.m. hours. She felt dizzy and sat back down. She had to crawl to the bathroom as she was very dizzy with standing. This persisted and she had an episode of nausea as well as vomiting. She denies any other neurological symptoms including no weakness or loss of sensation in her extremities. There is no associated chest pain, palpitations or shortness of breath. She went to the emergency room and a head CT was unremarkable. She was found to have a pancytopenia. Note she is followed by hematology oncology for her anemia by her report. She was asked to followup here. She feels better today. Note she does not have significant dyspnea on exertion, orthopnea, PND or chest pain.   Current Medications (verified): 1)  Lisinopril 10 Mg Tabs (Lisinopril) .... Take One Daily 2)  Bisoprolol Fumarate 5 Mg Tabs (Bisoprolol Fumarate) .... 1/2 Tab Po Once Daily 3)  Simvastatin 20 Mg Tabs (Simvastatin) .... Take One Daily 4)  Aspirin 81 Mg  Tbec (Aspirin) .... One By Mouth Every Day 5)  Actos 15 Mg Tabs (Pioglitazone  Hcl) .... Take One Daily 6)  Centrum Silver  Tabs (Multiple Vitamins-Minerals) .... Take One Daily 7)  Ocuvite Preservision  Tabs (Multiple Vitamins-Minerals) .Marland Kitchen.. 1 Tab By Mouth Once Daily 8)  Diazepam 5 Mg Tabs (Diazepam) .... Take As Needed 9)  Allopurinol 100 Mg Tabs (Allopurinol) .Marland Kitchen.. 1 Tab By Mouth Once Daily 10)  Mobic 7.5 Mg Tabs (Meloxicam) .... Take As Needed 11)  Avasta Eye Injection .... Injection Every 8 Weeks 12)  Caltrate 600 1500 Mg Tabs (Calcium Carbonate) .Marland Kitchen.. 1 Tab By Mouth Once Daily 13)  Cinnamon 500 Mg Caps (Cinnamon) .Marland Kitchen.. 1 Tab By Mouth Two Times A Day 14)  Vigamox 0.5 % Soln (Moxifloxacin Hcl) .... As Directed 15)  Systane 0.4-0.3 % Soln (Polyethyl Glycol-Propyl Glycol) .... As Directed 16)  Geritol Complete  Tabs (Iron-Vitamins) .Marland Kitchen.. 1 Tab By Mouth Once Daily  Allergies: 1)  ! Sulfa 2)  ! Floxin 3)  ! * Tequin 4)  ! Hydrocodone  Past History:  Past Medical History: GASTROESOPHAGEAL REFLUX DISEASE (ICD-530.81) HYPERLIPIDEMIA (ICD-272.4) HYPERTENSION (ICD-401.9) CHEST PAIN, ATYPICAL (ICD-786.59) DIABETES MELLITUS, TYPE II (ICD-250.00) MACULAR DEGENERATION (ICD-362.50) GOUT, HX OF (ICD-V12.2) OSTEOPOROSIS (ICD-733.00) mitral regurgitation Anemia  Past Surgical History: Reviewed history from 07/23/2008 and no changes required.  appendectomy, hysterectomy and abdominal   cyst removal.   Social History: Reviewed history from 07/23/2008 and no changes required.  Lives in [place] and lives alone, retired from   [employer] work, denies any alcohol, tobacco or drug use.   Review of Systems  no fevers or chills, productive cough, hemoptysis, dysphasia, odynophagia, melena, hematochezia, dysuria, hematuria, rash, seizure activity, orthopnea, PND,  claudication. Remaining systems are negative.   Vital Signs:  Patient profile:   77 year old female Height:      63 inches Weight:      144 pounds Pulse rate:   59 / minute Pulse (ortho):   58 /  minute Resp:     14 per minute BP sitting:   137 / 55  (left arm) BP standing:   145 / 62  Vitals Entered By: Kem Parkinson (August 12, 2009 10:44 AM)  Serial Vital Signs/Assessments:  Time      Position  BP       Pulse  Resp  Temp     By           Lying RA  147/64   54                    Kimalexis Barnes           Sitting   151/55   55                    Kimalexis Barnes           Standing  145/62   58                    Kimalexis Barnes   Physical Exam  General:  Well-developed well-nourished in no acute distress.  Skin is warm and dry.  HEENT is normal.  Neck is supple. No thyromegaly. right carotid bruit Chest is clear to auscultation with normal expansion.  Cardiovascular exam is regular rate and rhythm.  Abdominal exam nontender or distended. No masses palpated. Extremities show trace edema. neuro grossly intact    EKG  Procedure date:  08/12/2009  Findings:      Sinus rhythm at a rate of 56. Right axis deviation. Nonspecific T-wave changes.  Impression & Recommendations:  Problem # 1:  SYNCOPE (ICD-780.2) Patient is having orthostatic type. I wonder whether she has developed autonomic dysfunction. I will decrease her lisinopril to 5 mg p.o. daily. We will follow her blood pressure and she may need further reductions if her symptoms persist. Note her LV function is normal and there is no ischemia on a recent Myoview. Note orthostatic blood pressures were checked in the office today. Position she was 147/64. In the standing position she was 151/60. She is therefore not orthostatic at this point. Her updated medication list for this problem includes:    Lisinopril 5 Mg Tabs (Lisinopril) .Marland Kitchen... Take one tablet by mouth daily    Bisoprolol Fumarate 5 Mg Tabs (Bisoprolol fumarate) .Marland Kitchen... 1/2 tab po once daily    Aspirin 81 Mg Tbec (Aspirin) ..... One by mouth every day  Problem # 2:  CAROTID STENOSIS (ICD-433.10) Continue aspirin and statin. Followup carotid Dopplers  September 2012. Her updated medication list for this problem includes:    Aspirin 81 Mg Tbec (Aspirin) ..... One by mouth every day  Her updated medication list for this problem includes:    Aspirin 81 Mg Tbec (Aspirin) ..... One by mouth every day  Problem # 3:  HYPERLIPIDEMIA (ICD-272.4)  Continue statin. Lipids and liver monitored by primary care. Her updated medication list for this problem includes:    Simvastatin 20 Mg Tabs (Simvastatin) .Marland Kitchen... Take one daily  Her updated medication list for this problem includes:    Simvastatin  20 Mg Tabs (Simvastatin) .Marland Kitchen... Take one daily  Problem # 4:  HYPERTENSION (ICD-401.9)  Decreased blood pressure medicines as described above. Her updated medication list for this problem includes:    Lisinopril 5 Mg Tabs (Lisinopril) .Marland Kitchen... Take one tablet by mouth daily    Bisoprolol Fumarate 5 Mg Tabs (Bisoprolol fumarate) .Marland Kitchen... 1/2 tab po once daily    Aspirin 81 Mg Tbec (Aspirin) ..... One by mouth every day  Her updated medication list for this problem includes:    Lisinopril 10 Mg Tabs (Lisinopril) .Marland Kitchen... Take one daily    Bisoprolol Fumarate 5 Mg Tabs (Bisoprolol fumarate) .Marland Kitchen... 1/2 tab po once daily    Aspirin 81 Mg Tbec (Aspirin) ..... One by mouth every day  Problem # 5:  DIABETES MELLITUS, TYPE II (ICD-250.00)  Management per primary care. Her updated medication list for this problem includes:    Lisinopril 5 Mg Tabs (Lisinopril) .Marland Kitchen... Take one tablet by mouth daily    Aspirin 81 Mg Tbec (Aspirin) ..... One by mouth every day    Actos 15 Mg Tabs (Pioglitazone hcl) .Marland Kitchen... Take one daily  Her updated medication list for this problem includes:    Lisinopril 10 Mg Tabs (Lisinopril) .Marland Kitchen... Take one daily    Aspirin 81 Mg Tbec (Aspirin) ..... One by mouth every day    Actos 15 Mg Tabs (Pioglitazone hcl) .Marland Kitchen... Take one daily  Problem # 6:  CHEST PAIN, ATYPICAL (ICD-786.59)  Recent Myoview negative. Her updated medication list for this problem  includes:    Lisinopril 5 Mg Tabs (Lisinopril) .Marland Kitchen... Take one tablet by mouth daily    Bisoprolol Fumarate 5 Mg Tabs (Bisoprolol fumarate) .Marland Kitchen... 1/2 tab po once daily    Aspirin 81 Mg Tbec (Aspirin) ..... One by mouth every day  Orders: EKG w/ Interpretation (93000)  Her updated medication list for this problem includes:    Lisinopril 10 Mg Tabs (Lisinopril) .Marland Kitchen... Take one daily    Bisoprolol Fumarate 5 Mg Tabs (Bisoprolol fumarate) .Marland Kitchen... 1/2 tab po once daily    Aspirin 81 Mg Tbec (Aspirin) ..... One by mouth every day  Problem # 7:  GOUT, HX OF (ICD-V12.2)  Patient Instructions: 1)  Your physician recommends that you schedule a follow-up appointment in: 3 months 2)  Your physician has recommended you make the following change in your medication:Lisinopril decreased to 5 mg once a day. Prescriptions: LISINOPRIL 5 MG TABS (LISINOPRIL) Take one tablet by mouth daily  #30 x 6   Entered by:   Ollen Gross, RN, BSN   Authorized by:   Ferman Hamming, MD, [facility]   Signed by:   Ollen Gross, RN, BSN on 08/12/2009   Method used:   Electronically to        [facility]  [id number] (ret[facility])       [address]       [place], [place]  [address]       Ph: [phone] or [phone]       Fax: [phone]   RxID:   [id number]

## 2010-05-21 NOTE — Progress Notes (Signed)
Summary: dose pt need to keep appt  Phone Note Call from Patient Call back at Home Phone [phone]   Caller: Patient Reason for Call: Talk to Nurse Summary of Call: pt has EPH on 4/26 dose she need to keep it since she has a myoview on 3/28 and the results have been called to her already. Please call tommorrow because her answering machine is not working due to her power being out and you wont be able to leave a message Initial call taken by: Omer Jack,  July 22, 2009 11:13 AM  Follow-up for Phone Call        left message for pt to come to appt Deliah Goody, RN  July 24, 2009 4:27 PM

## 2010-05-21 NOTE — Miscellaneous (Signed)
  Clinical Lists Changes  Observations: Added new observation of US CAROTID: Progression of disease on the right Stable, mild carotid artery disease on the left 40-59% RICA stenosis 0-39% LICA stenosis   f/u 1 year (12/24/2009 11:57)      Carotid Doppler  Procedure date:  12/24/2009  Findings:      Progression of disease on the right Stable, mild carotid artery disease on the left 40-59% RICA stenosis 0-39% LICA stenosis   f/u 1 year

## 2010-06-09 ENCOUNTER — Ambulatory Visit
Admission: RE | Admit: 2010-06-09 | Discharge: 2010-06-09 | Disposition: A | Discharge status: Home / Self Care | Payer: Medicare HMO | Source: Ambulatory Visit | Attending: Internal Medicine | Admitting: Internal Medicine

## 2010-06-09 DIAGNOSIS — Z1239 Encounter for other screening for malignant neoplasm of breast: Secondary | ICD-10-CM

## 2010-06-30 LAB — POCT I-STAT, CHEM 8
BUN: 32 mg/dL — ABNORMAL HIGH (ref 6–23)
Creatinine, Ser: 1.6 mg/dL — ABNORMAL HIGH (ref 0.4–1.2)
Glucose, Bld: 127 mg/dL — ABNORMAL HIGH (ref 70–99)
Potassium: 3.7 mEq/L (ref 3.5–5.1)
Sodium: 138 mEq/L (ref 135–145)

## 2010-06-30 LAB — BASIC METABOLIC PANEL
BUN: 23 mg/dL (ref 6–23)
Calcium: 9 mg/dL (ref 8.4–10.5)
Chloride: 107 mEq/L (ref 96–112)
GFR calc Af Amer: 48 mL/min — ABNORMAL LOW (ref >60)
GFR calc non Af Amer: 40 mL/min — ABNORMAL LOW (ref >60)
Glucose, Bld: 80 mg/dL (ref 70–99)
Glucose, Bld: 80 mg/dL (ref 70–99)
Potassium: 4 mEq/L (ref 3.5–5.1)
Potassium: 4.2 mEq/L (ref 3.5–5.1)
Sodium: 141 mEq/L (ref 135–145)

## 2010-06-30 LAB — URINALYSIS, ROUTINE W REFLEX MICROSCOPIC
Bilirubin Urine: NEGATIVE
Glucose, UA: NEGATIVE mg/dL
Hgb urine dipstick: NEGATIVE
Specific Gravity, Urine: 1.014 (ref 1.005–1.030)
pH: 6.5 (ref 5.0–8.0)

## 2010-06-30 LAB — POCT CARDIAC MARKERS
CKMB, poc: 2.2 ng/mL (ref 1.0–8.0)
Myoglobin, poc: 174 ng/mL (ref 12–200)
Troponin i, poc: <0.05 ng/mL (ref 0.00–0.09)

## 2010-06-30 LAB — GLUCOSE, CAPILLARY
Glucose-Capillary: 71 mg/dL (ref 70–99)
Glucose-Capillary: 91 mg/dL (ref 70–99)

## 2010-06-30 LAB — CBC
HCT: 31.9 % — ABNORMAL LOW (ref 36.0–46.0)
Hemoglobin: 10.3 g/dL — ABNORMAL LOW (ref 12.0–15.0)
Hemoglobin: 9.6 g/dL — ABNORMAL LOW (ref 12.0–15.0)
MCH: 28.7 pg (ref 26.0–34.0)
MCH: 28.7 pg (ref 26.0–34.0)
MCHC: 32.3 g/dL (ref 30.0–36.0)
MCHC: 32.3 g/dL (ref 30.0–36.0)
RBC: 3.59 MIL/uL — ABNORMAL LOW (ref 3.87–5.11)
RDW: 14.3 % (ref 11.5–15.5)

## 2010-06-30 LAB — CARDIAC PANEL(CRET KIN+CKTOT+MB+TROPI)
CK, MB: 2.3 ng/mL (ref 0.3–4.0)
Relative Index: 1.7 (ref 0.0–2.5)
Total CK: 135 U/L (ref 7–177)
Troponin I: 0.01 ng/mL (ref 0.00–0.06)
Troponin I: 0.03 ng/mL (ref 0.00–0.06)

## 2010-06-30 LAB — DIFFERENTIAL
Basophils Relative: 1 % (ref 0–1)
Lymphs Abs: 1 10*3/uL (ref 0.7–4.0)
Monocytes Absolute: 0.3 10*3/uL (ref 0.1–1.0)
Monocytes Relative: 11 % (ref 3–12)
Neutro Abs: 1.5 10*3/uL — ABNORMAL LOW (ref 1.7–7.7)
Neutrophils Relative %: 49 % (ref 43–77)

## 2010-06-30 LAB — URINE CULTURE: Culture: NO GROWTH

## 2010-06-30 LAB — URINE MICROSCOPIC-ADD ON

## 2010-06-30 LAB — CK TOTAL AND CKMB (NOT AT ARMC)
CK, MB: 3.5 ng/mL (ref 0.3–4.0)
Relative Index: 2.1 (ref 0.0–2.5)
Total CK: 170 U/L (ref 7–177)

## 2010-06-30 LAB — LIPID PANEL
Cholesterol: 126 mg/dL (ref 0–200)
HDL: 58 mg/dL (ref >39)
Total CHOL/HDL Ratio: 2.3 RATIO
Total CHOL/HDL Ratio: 2.3 RATIO
VLDL: 10 mg/dL (ref 0–40)

## 2010-06-30 LAB — HEMOGLOBIN A1C: Hgb A1c MFr Bld: 5.7 % — ABNORMAL HIGH (ref <5.7)

## 2010-07-07 LAB — CBC
HCT: 26.6 % — ABNORMAL LOW (ref 36.0–46.0)
Hemoglobin: 9 g/dL — ABNORMAL LOW (ref 12.0–15.0)
MCHC: 34 g/dL (ref 30.0–36.0)
MCV: 89 fL (ref 78.0–100.0)
RBC: 2.99 MIL/uL — ABNORMAL LOW (ref 3.87–5.11)

## 2010-07-07 LAB — BASIC METABOLIC PANEL
CO2: 29 mEq/L (ref 19–32)
Chloride: 104 mEq/L (ref 96–112)
GFR calc Af Amer: >60 mL/min (ref >60)
Potassium: 3.8 mEq/L (ref 3.5–5.1)
Sodium: 140 mEq/L (ref 135–145)

## 2010-07-07 LAB — URINALYSIS, ROUTINE W REFLEX MICROSCOPIC
Glucose, UA: NEGATIVE mg/dL
Protein, ur: NEGATIVE mg/dL
Specific Gravity, Urine: 1.004 — ABNORMAL LOW (ref 1.005–1.030)

## 2010-07-07 LAB — DIFFERENTIAL
Basophils Relative: 1 % (ref 0–1)
Eosinophils Absolute: 0.1 10*3/uL (ref 0.0–0.7)
Eosinophils Relative: 3 % (ref 0–5)
Monocytes Absolute: 0.3 10*3/uL (ref 0.1–1.0)
Monocytes Relative: 9 % (ref 3–12)

## 2010-07-07 LAB — POCT CARDIAC MARKERS: Myoglobin, poc: 198 ng/mL (ref 12–200)

## 2010-07-07 LAB — URINE CULTURE: Culture: NO GROWTH

## 2010-07-12 LAB — BASIC METABOLIC PANEL
BUN: 34 mg/dL — ABNORMAL HIGH (ref 6–23)
Calcium: 9 mg/dL (ref 8.4–10.5)
Chloride: 100 mEq/L (ref 96–112)
Creatinine, Ser: 1.16 mg/dL (ref 0.4–1.2)
GFR calc Af Amer: 55 mL/min — ABNORMAL LOW (ref >60)
GFR calc non Af Amer: 39 mL/min — ABNORMAL LOW (ref >60)
GFR calc non Af Amer: 45 mL/min — ABNORMAL LOW (ref >60)
Glucose, Bld: 103 mg/dL — ABNORMAL HIGH (ref 70–99)
Glucose, Bld: 104 mg/dL — ABNORMAL HIGH (ref 70–99)
Potassium: 4 mEq/L (ref 3.5–5.1)
Sodium: 136 mEq/L (ref 135–145)

## 2010-07-12 LAB — DIFFERENTIAL
Basophils Absolute: 0 10*3/uL (ref 0.0–0.1)
Eosinophils Absolute: 0 10*3/uL (ref 0.0–0.7)
Eosinophils Relative: 1 % (ref 0–5)

## 2010-07-12 LAB — CBC
HCT: 27.8 % — ABNORMAL LOW (ref 36.0–46.0)
HCT: 31.5 % — ABNORMAL LOW (ref 36.0–46.0)
Hemoglobin: 9.3 g/dL — ABNORMAL LOW (ref 12.0–15.0)
MCHC: 33.6 g/dL (ref 30.0–36.0)
MCV: 87.2 fL (ref 78.0–100.0)
MCV: 87.3 fL (ref 78.0–100.0)
Platelets: 107 10*3/uL — ABNORMAL LOW (ref 150–400)
RDW: 15.3 % (ref 11.5–15.5)
RDW: 15.4 % (ref 11.5–15.5)

## 2010-07-12 LAB — URINALYSIS, ROUTINE W REFLEX MICROSCOPIC
Ketones, ur: NEGATIVE mg/dL
Nitrite: NEGATIVE
Protein, ur: NEGATIVE mg/dL

## 2010-07-12 LAB — LIPID PANEL
LDL Cholesterol: 74 mg/dL (ref 0–99)
VLDL: 15 mg/dL (ref 0–40)

## 2010-07-12 LAB — GLUCOSE, CAPILLARY
Glucose-Capillary: 143 mg/dL — ABNORMAL HIGH (ref 70–99)
Glucose-Capillary: 95 mg/dL (ref 70–99)

## 2010-07-12 LAB — POCT CARDIAC MARKERS
CKMB, poc: <1 ng/mL — ABNORMAL LOW (ref 1.0–8.0)
Troponin i, poc: <0.05 ng/mL (ref 0.00–0.09)

## 2010-07-12 LAB — CARDIAC PANEL(CRET KIN+CKTOT+MB+TROPI)
CK, MB: 1.1 ng/mL (ref 0.3–4.0)
Total CK: 64 U/L (ref 7–177)
Total CK: 69 U/L (ref 7–177)

## 2010-07-12 LAB — PROTIME-INR
INR: 1 (ref 0.00–1.49)
Prothrombin Time: 13.1 seconds (ref 11.6–15.2)

## 2010-07-30 LAB — URINALYSIS, ROUTINE W REFLEX MICROSCOPIC
Glucose, UA: NEGATIVE mg/dL
Ketones, ur: NEGATIVE mg/dL
Protein, ur: NEGATIVE mg/dL

## 2010-07-30 LAB — CBC
HCT: 29.7 % — ABNORMAL LOW (ref 36.0–46.0)
MCV: 85.3 fL (ref 78.0–100.0)
Platelets: 104 10*3/uL — ABNORMAL LOW (ref 150–400)
Platelets: 124 10*3/uL — ABNORMAL LOW (ref 150–400)
RDW: 14.3 % (ref 11.5–15.5)
WBC: 3.4 10*3/uL — ABNORMAL LOW (ref 4.0–10.5)

## 2010-07-30 LAB — CARDIAC PANEL(CRET KIN+CKTOT+MB+TROPI)
CK, MB: 1.7 ng/mL (ref 0.3–4.0)
Relative Index: INVALID (ref 0.0–2.5)
Total CK: 87 U/L (ref 7–177)
Total CK: 97 U/L (ref 7–177)
Troponin I: <0.01 ng/mL (ref 0.00–0.06)

## 2010-07-30 LAB — BASIC METABOLIC PANEL
BUN: 22 mg/dL (ref 6–23)
Calcium: 9.3 mg/dL (ref 8.4–10.5)
Creatinine, Ser: 1.17 mg/dL (ref 0.4–1.2)
GFR calc Af Amer: 54 mL/min — ABNORMAL LOW (ref >60)
GFR calc Af Amer: >60 mL/min (ref >60)
GFR calc non Af Amer: 45 mL/min — ABNORMAL LOW (ref >60)
GFR calc non Af Amer: 55 mL/min — ABNORMAL LOW (ref >60)
Potassium: 3.9 mEq/L (ref 3.5–5.1)
Sodium: 140 mEq/L (ref 135–145)

## 2010-07-30 LAB — D-DIMER, QUANTITATIVE: D-Dimer, Quant: 0.42 ug/mL-FEU (ref 0.00–0.48)

## 2010-07-30 LAB — GLUCOSE, CAPILLARY: Glucose-Capillary: 99 mg/dL (ref 70–99)

## 2010-07-30 LAB — DIFFERENTIAL
Eosinophils Absolute: 0.1 10*3/uL (ref 0.0–0.7)
Lymphocytes Relative: 35 % (ref 12–46)
Lymphs Abs: 1.2 10*3/uL (ref 0.7–4.0)
Neutro Abs: 1.8 10*3/uL (ref 1.7–7.7)
Neutrophils Relative %: 52 % (ref 43–77)

## 2010-07-30 LAB — POCT CARDIAC MARKERS
CKMB, poc: 1.7 ng/mL (ref 1.0–8.0)
Myoglobin, poc: 149 ng/mL (ref 12–200)

## 2010-07-30 LAB — LIPID PANEL
Total CHOL/HDL Ratio: 3.6 RATIO
VLDL: 24 mg/dL (ref 0–40)

## 2010-07-30 LAB — CK TOTAL AND CKMB (NOT AT ARMC)
Relative Index: 2.3 (ref 0.0–2.5)
Total CK: 119 U/L (ref 7–177)

## 2010-07-30 LAB — TROPONIN I: Troponin I: <0.01 ng/mL (ref 0.00–0.06)

## 2010-07-30 LAB — URINE MICROSCOPIC-ADD ON

## 2010-09-01 NOTE — H&P (Signed)
NAME:  Patricia Moran, Patricia Moran                   ACCOUNT NO.:  [id number]   MEDICAL RECORD NO.:  [id number]          PATIENT TYPE:  INP   LOCATION:  1832                         FACILITY:  [facility]   PHYSICIAN:  Mobolaji B. Bakare, M.D.DATE OF BIRTH:  Jun 09, 1929   DATE OF ADMISSION:  11/15/2007  DATE OF DISCHARGE:                              HISTORY & PHYSICAL   PRIMARY CARE PHYSICIAN:  Massie Maroon, MD   CHIEF COMPLAINTS:  Chest pain.   HISTORY OF PRESENTING COMPLAINT:  Patricia Moran is a 77 year old Caucasian  female with multiple cardiovascular risk factors including diabetes  mellitus, hyperlipidemia, hypertension.  She was in her usual state of  health until 3 days ago after helping out transporting friends to  doctor's appointment.  She developed chest pain on returning home, left-  sided chest pain, which was radiating to her left hand.  It did not last  long; however, it was relieved from 7 to her about 2 after using  aspirin.  He felt well for the rest of the day and subsequent day.  Again, today, he had some chest pain; but could not tell if it was  related to exertion or not.  He went to see his primary care physician,  today, and he had an EKG which did not show any acute ST changes.  The  patient was referred to the emergency room for further evaluation.   The patient denies associated shortness of breath, nausea, vomiting,  diaphoresis or palpitation accompanying these episodes of chest pain.  Currently the chest pain has gone.  The patient wants to go home, but  she has been convinced to stay for further evaluation.   She had a Cardiolite stress test by Dr. Jacinto Halim in October 2003 for  exertional dyspnea and chest pain.  This no evidence of myocardial  ischemia.   The patient's initial EKG remains unchanged from old, and she has no  acute ST changes.  First set of cardiac markers at the point of care was  normal.   REVIEW OF SYSTEMS:  She denies fever or chills.  She endorses dysuria  about a week ago, and no fever or increased frequency of micturition.  She denies diarrhea, constipation, abdominal pain.  There is no cough or  shortness of breath.  The patient has no headaches or changes in her  vision.  No skin lesions.   PAST MEDICAL HISTORY:  1. Diabetes mellitus.  2. Gout.  3. Hyperlipidemia.  4. Hypertension.  5. Macular degeneration, she receives monthly injection.  6. Osteoporosis.   CURRENT MEDICATIONS INCLUDE:  1. Bisoprolol hydrochlorothiazide 12.5/6.25 mg daily.  2. Allopurinol 100 mg daily.  3. Multivitamin 1 daily.  4. Lisinopril 10 mg daily.  5. ECASA 81 mg daily.  6. Zocor  20 mg q.h.s.  7. Cinnamon daily.  8. Diazepam 5 mg p.r.n.   ALLERGIES:  SULFA causes abdominal cramps.  FLOXIN causes palpitations.  TEQUIN gives her a yeast infection.  HYDROCODONE causes nausea and  vomiting.   FAMILY HISTORY:  Significant for myocardial infarction in her dad who  passed away at the age of 62.  Mom had a fall and died from head trauma.   SOCIAL HISTORY:  She does not smoke cigarettes nor drink alcohol.  She  is very active at home.  She lives alone and independent of activities  of daily living.  She still drives her car, mows her lawn, and she does  not describe any exertional chest pain.   PHYSICAL EXAMINATION:  INITIAL VITALS:  Temperature 98.1, blood pressure  152/61, pulse of 55, respiratory rate of 16, O2 saturation of 100%.  GENERAL:  On examination the patient is awake, alert, oriented in time,  place and person.  HEENT AND NECK:  Normocephalic atraumatic.  Pupils are equal, round and  reactive to light.  Extraocular muscle movement intact.  No elevated  JVD.  Mucous membranes moist.  No oral thrush.  LUNGS:  Clear clinically to auscultation.  CARDIOVASCULAR SYSTEM:  S1-S2 regular.  No murmur or gallop.  ABDOMEN:  Not distended, soft, nontender.  Bowel sounds present.  No  palpable organomegaly.  EXTREMITIES:  No pedal edema or calf  tenderness.  Dorsalis pedis pulses  palpable bilaterally.  CENTRAL NERVOUS SYSTEM: No focal neurological deficit.  SKIN:  No rash or petechiae.   INITIAL LABORATORY DATA:  Sodium 136, potassium 4.2, chloride 98, CO2  28, glucose 93, BUN 26, creatinine 1.15, bilirubin 1.1, AST 24, ALT 17.  Troponin 0.01, CK-MB 3.9, relative index 2.7, creatinine kinase 142.  PTT 13.3,  PT/INR 13.2/1.0.  White blood cells 4.2, hemoglobin 11.5,  hematocrit 34.2, platelets 152.  Urine microscopy shows white cells of  21-50, many bacteria, positive for large leukocyte esterase.  Chest x-  ray shows mild cardiomegaly, no acute finding, degenerative spondylosis  lower thoracic spine.   ASSESSMENT AND PLAN:  1. Patricia Moran is a 77 year old Caucasian female with multiple      cardiovascular risk factors presenting with intermittent chest      pain.  She is currently chest pain free and has no acute changes on      EKG.  Initial set of cardiac markers were normal.  She will be      admitted for chest pain, query unstable angina.  She will be      admitted to the cardiac unit.  Aspirin 325 mg daily.  Will start      nitroglycerin infusion if chest pain recurs.  Heparin infusion will      be continued as ordered by the emergency room physician.  Will      cycle cardiac enzymes.  Check fasting lipid profile, hemoglobin      A1c, also check D-dimer.  Will continue bisoprolol      hydrochlorothiazide and lisinopril as before.  Cardiology will be      consulted.  2. Urinary tract infection.  Will start Rocephin 1 gram IV daily.      Urine culture will be sent.  3. Diabetes mellitus.  Blood glucose appears to be fairly controlled      on CMET.  Will check hemoglobin A1c and continue Actos as before.  4. Hypertension.  This is fairly controlled.  Will resume home      medications.  5. Mild anemia.  Will monitor.  6. Hyperlipidemia.  We will resume Zocor and check fasting lipid      profile.      Mobolaji B. Corky Downs,  M.D.  Electronically Signed     MBB/MEDQ  D:  11/15/2007  T:  11/15/2007  Job:  [id number]   cc:   Cristy Hilts. Jacinto Halim, MD  Massie Maroon, MD

## 2010-09-01 NOTE — Discharge Summary (Signed)
NAME:  Patricia Moran, Patricia Moran                   ACCOUNT NO.:  [id number]   MEDICAL RECORD NO.:  [id number]          PATIENT TYPE:  INP   LOCATION:  4737                         FACILITY:  [facility]   PHYSICIAN:  Eduard Clos, MDDATE OF BIRTH:  05/26/1929   DATE OF ADMISSION:  07/16/2008  DATE OF DISCHARGE:  07/17/2008                               DISCHARGE SUMMARY   COURSE IN THE HOSPITAL:  A 77 year old female with known history of  diabetes mellitus type 2, hypertension, hyperlipidemia, negative  Cardiolite test in August 2009.  The patient complains of chest pain.  The patient admitted to telemetry floor, serial cardiac enzymes were  followed.  EKG was negative.  Cardiac enzymes also negative.  The  patient had atypical features of a chest pain.  At this time, 2-D echo  is ordered and is about to be done.  Since the patient is largely  asymptomatic at this time and is hemodynamically stable, we will  ambulate the patient and get a 2-D echo and advised to follow up with  outpatient [facility] for which an appointment has been made for  July 24, 2008.  I discussed this plan with the patient and the patient's  daughter, both of them are agreeable to the plan.  At the time of the  dictation, the patient was hemodynamically stable.   PROCEDURES DONE DURING THE STAY:  Chest x-ray on July 16, 2008, shows  no acute cardiopulmonary abnormality.   PERTINENT LABORATORIES:  Troponin was less than 0.01 x3 and LDL is 74.  The patient's hemoglobin was 10.1 and hematocrit 29.7, stable.  We will  advise to have further workup on it as outpatient.   FINAL DIAGNOSES:  1. Atypical chest pain, acute coronary syndromes ruled out.  2. Diabetes mellitus type 2.  3. Hypertension.  4. History of gastroesophageal reflux disease.   MEDICATION AT DISCHARGE:  1. Lisinopril 10 mg p.o. daily.  2. Bisoprolol/hydrochlorothiazide 2.5/6.25 mg p.o. daily.  3. Simvastatin 20 mg p.o. daily.  4. Aspirin 81 mg  p.o. daily.  5. Actos 15 mg p.o. daily.  6. Cinnamon 2000 mg p.o. daily.  7. Centrum Silver 1 tablet p.o. daily.  8. Vitamin C p.o. daily.  9. PreserVision 1 tablet p.o. daily.  10.Allopurinol as taken earlier.  The patient at this moment is not      sure of her dose.  11.Meloxicam 7.5 mg as needed for all body aches.  12.Diazepam 5 mg at bedtime p.r.n.   PLAN:  The patient advised to follow up with primary care physician, Dr.  Selena Batten within a-week time.  Recheck her CBC and BMET and advised further  workup of anemia through her primary care physician.  To follow up with  [facility] as scheduled on July 24, 2008, at 10:30 a.m. and  further workup through cardiologist and to  follow up with a 2-D echo through her cardiologist.  To be on cardiac-  healthy, carb-modified diet and check her blood sugar a.c. and at  bedtime.  In the event of any recurrence of symptoms, to go to  the  nearest ER.  Plan discussed with the patient and the patient's daughter.      Eduard Clos, MD  Electronically Signed     ANK/MEDQ  D:  07/17/2008  T:  07/18/2008  Job:  [id number]

## 2010-09-01 NOTE — Assessment & Plan Note (Signed)
[facility]                            CARDIOLOGY OFFICE NOTE   NAME:Kerwin, MEE MACDONNELL                          MRN:          [id number]  DATE:11/29/2007                            DOB:          1930-02-05    PRIMARY CARDIOLOGIST:  Madolyn Frieze. Jens Som, MD, [facility]   PRIMARY CARE PHYSICIAN:  Massie Maroon, MD   Ms. Dellis is a 77 year old white female patient who was recently admitted  to the hospital with chest pain.  She ruled out for an MI.  She had a  UTI that was treated and she was discharged home for followup stress  Myoview, this was performed on November 22, 2007.  She had normal stress  nuclear study.  EF was 73%.  The patient did have 1 episode of chest  pain, which she describes as a little ache in the center of her chest  that eases with moving around.  She does have a prior history of reflux  disease, but does not think that this is related.   CURRENT MEDICATIONS:  1. Bisoprolol and hydrochlorothiazide 2.5/6.25 mg daily.  2. Allopurinol 100 mg daily.  3. Multivitamin daily.  4. Lisinopril 10 mg daily.  5. Aspirin 81 mg daily.  6. Zocor 20 mg daily.  7. Actos 7.5 mg daily.  8. Cinnamon daily.   PHYSICAL EXAMINATION:  GENERAL:  This is a pleasant 77 year old white  female in no acute distress.  VITAL SIGNS:  Blood pressure 133/60, pulse 58, and weight 151.  NECK:  Without JVD or HJR.  She does have her right carotid bruit.  LUNGS:  Clear in anterior, posterior, and lateral.  HEART:  Regular rate and rhythm at 60 beats per minute.  Normal S1 and  S2.  No murmur, rub, bruit, thrill, or heave noted.  ABDOMEN:  Soft  without organomegaly, masses, lesions, or abnormal tenderness.  EXTREMITIES:  Without cyanosis, clubbing, or edema.  She has good distal  pulses.   IMPRESSION:  1. Chest pain, question etiology.  Negative stress Myoview on November 22, 2007.  2. Right carotid bruit.  We will schedule carotid Doppler.  3. Hypertension.  4.  Non-insulin-dependent diabetes mellitus.  5. Hyperlipidemia.  6. Family history of coronary artery disease.  7. Recent urinary tract infection.  8. Gout.  9. Macular degeneration.  10.Osteoporosis.   PLAN:  We will schedule the patient for carotid Doppler.  She can follow  up with Dr. Jens Som p.r.n. and follow up with Dr. Selena Batten.       Jacolyn Reedy, PA-C  Electronically Signed      Luis Abed, MD, [facility]  Electronically Signed   ML/MedQ  DD: 11/29/2007  DT: 11/30/2007  Job #: [id number]

## 2010-09-01 NOTE — H&P (Signed)
NAME:  Patricia Moran, Patricia Moran                   ACCOUNT NO.:  [id number]   MEDICAL RECORD NO.:  [id number]          PATIENT TYPE:  INP   LOCATION:  4737                         FACILITY:  [facility]   PHYSICIAN:  Richarda Overlie, MD       DATE OF BIRTH:  1929/06/24   DATE OF ADMISSION:  07/16/2008  DATE OF DISCHARGE:                              HISTORY & PHYSICAL   PRIMARY CARE PHYSICIAN:  Massie Maroon, MD   CARDIOLOGY:  Madolyn Frieze. Jens Som, MD, [facility]   CHIEF COMPLAINT:  Chest pain.   SUBJECTIVE:  This is a 77 year old female with a history of diabetes and  hypertension who presents to the ER with a chief complaint of substernal  chest pain which started on Saturday.  Described as pain in her chest  elicited  with palpation of her chest wall not particularly worse with  exertion, not particularly made worse with deep inspiration.  The pain  is described as dull with radiation of the pain into her neck and her  left arm and her left shoulder.  The patient denies any associated  nausea, vomiting, shortness of breath, diaphoresis, palpitations,  syncope, or presyncope.  The patient is not quite sure if the pain is  made worse with any exertion.  There are no relieving or aggravating  factors that she can pin point.  The patient was seen by Dr. Jens Som at  [facility] and had a negative Myoview study in August 2009 that  showed an ejection fraction of 73%.   PAST MEDICAL HISTORY:  1. Status post cardiac catheterization in 1991 okay per the patient.  2. Hypertension.  3. Dyslipidemia.  4. Non-insulin dependent diabetes.  5. Most recent Cardiolite stress test in August 2009.  6. Gastroesophageal reflux disease.  7. History of osteoporosis.  8. Macular degeneration  9. Anemia.   ALLERGIES:  Intolerant to SULFA, FLOXIN, TEQUIN and HYDROCODONE.   SURGICAL HISTORY:  Status post appendectomy, hysterectomy and abdominal  cyst removal.   SOCIAL HISTORY:  Lives in [place] and lives alone, retired  from  [employer] work, denies any alcohol, tobacco or drug use.   FAMILY HISTORY:  Mother died at the age of 74 after a fall and  subsequent head trauma, father died at the age of 57 of an MI.  She is  the oldest and none of her younger siblings have coronary artery  disease.   REVIEW OF SYSTEMS:  Ten-point review of systems was done and was found  to be positive as in HPI, otherwise grossly negative.   HOME MEDICATIONS:  Bisoprolol, hydrochlorothiazide, lisinopril, Zocor,  aspirin, allopurinol, Actos, meloxicam, Centrum, calcium and diazepam.   PHYSICAL EXAMINATION:  VITAL SIGNS:  Blood pressure 145/54, pulse is 64,  respirations 18.  GENERAL:  The patient appears to be comfortable in no acute distress.  HEENT:  Pupils equal and reactive.  Extraocular movements intact.  Sclerae anicteric.  NECK:  Supple without any JVD, thyromegaly and no palpable  lymphadenopathy.  CARDIOVASCULAR:  Regular rate and rhythm.  No appreciable murmurs, rubs  or gallops.  LUNGS:  Clear to auscultation bilaterally.  No wheezes or crackles,  rhonchi.  ABDOMEN:  Soft, nontender, nondistended, no hepatosplenomegaly.  EXTREMITIES:  Without cyanosis, clubbing or edema and no joint  deformity, effusions noted.  NEUROLOGIC:  Cranial nerves II-XII appear to be grossly intact.   Chest x-ray shows no acute cardiopulmonary disease.   EKG shows normal sinus rhythm with a right axis deviation at 61 beats  per minute without any acute ST-T segment changes.   ASSESSMENT:  1. Atypical chest pain.  2. Non-insulin-dependent diabetes.  3. Hypertension.  4. Dyslipidemia.   PLAN:  The patient has multiple cardiovascular risk factors but did have  a recent evaluation done in August 2009 and is a patient of Dr. Jens Som  and is closely followed by him on an outpatient basis.  The patient does  not describe any typical anginal symptoms which are ongoing and there is  no crescendo pattern to it.  In fact her chest  pain can be elicited on  palpation of her chest wall.  The patient appears to be hemodynamically  stable without any EKG changes.  Her troponin is negative less than 0.05  and her CK-MB is 1.7.  The patient will be admitted to a tele bed will  be ruled out for an acute coronary event.  She will be continued on  aspirin, beta blocker and all other cardiotropic medications.  If the  patient has any evolving EKG changes or if she develops an elevated  troponin, then [facility] will be notified.  If the patient is  ruled out for an acute coronary syndrome then her chest pain is probably  costochondritis and she can be discharged home in the morning.      Richarda Overlie, MD  Electronically Signed     NA/MEDQ  D:  07/16/2008  T:  07/17/2008  Job:  [id number]

## 2010-09-01 NOTE — Discharge Summary (Signed)
NAME:  Patricia Moran, Patricia Moran                   ACCOUNT NO.:  [id number]   MEDICAL RECORD NO.:  [id number]          PATIENT TYPE:  INP   LOCATION:  2905                         FACILITY:  [facility]   PHYSICIAN:  Ladell Pier, M.D.   DATE OF BIRTH:  Aug 26, 1929   DATE OF ADMISSION:  11/15/2007  DATE OF DISCHARGE:  11/17/2007                               DISCHARGE SUMMARY   DISCHARGE DIAGNOSES:  1. Chest pain rule out.  2. Urinary tract infection.  3. Diabetes.  4. Gout.  5. Hyperlipidemia.  6. Hypertension.  7. Macular degeneration.  8. Osteoporosis  9. Normocytic anemia.   DISCHARGE MEDICATIONS:  1. Cipro 250 mg b.i.d. x3 days.  2. Bisoprolol and hydrochlorothiazide 2.5/6.25 mg daily.  3. Allopurinol 100 mg daily.  4. Multivitamin daily.  5. Lisinopril 10 mg daily.  6. Aspirin 81 mg daily.  7. Zocor 20 mg at bedtime.  8. Cinnamon daily.  9. Diazepam 5 mg p.r.n.   FOLLOWUP APPOINTMENTS:  The patient scheduled for outpatient stress test  with [facility] on November 29, 2007, at 1:15.   PROCEDURES INPATIENT:  None.   CONSULTANTS:   Cardiology.   HISTORY OF PRESENT ILLNESS:  The patient is a 77 year old female with  multiple cardiovascular risk factors, diabetes, hyperlipidemia, and  hypertension.  She came in with chest pain.  Please see Dr. Albertina Senegal  note for full details.   Past medical history, family history, social history, meds, allergies,  review of systems per admission H&P.   PHYSICAL EXAMINATION:  VITAL SIGNS:  Temperature 97.8, pulse of 70,  blood pressure 124/43, respirations 14, pulse ox 99% on room air.  HEENT:  Head is normocephalic and atraumatic.  Pupils reactive to light  without erythema.  CARDIOVASCULAR: Regular rate and rhythm.  LUNGS:  Clear bilaterally.  ABDOMEN:  Positive bowel sounds.  EXTREMITIES:  Without edema.   HOSPITAL COURSE:  1. Chest pain:  The patient was admitted to the step-down unit and      started on heparin.  Cardiology  was consulted.  The patient is      scheduled per Cardiology for outpatient stress test.  The patient      cleared for discharge.  Her enzymes are normal.  The patient was      discharged home to follow up for her outpatient stress test.  2. UTI:  The patient was treated with IV Rocephin and discharged on      Cipro.  3. Diabetes has been stable throughout her hospitalization.  4. Hyperlipidemia.  She was continued on her Zocor.  5. Hypertension.  Blood pressure is well controlled while she was      inpatient.   DISCHARGE LABORATORY DATA:  WBC 4.5, hemoglobin 11.1, platelet 148, MCV  85.8 CK-MB 35, myoglobin 151, troponin less than 0.05, hemoglobin A1c  6.1.  Lipid profile, total cholesterol 144, triglycerides of 81, HDL 37,  LDL 91, D-dimer of 0.48.      Ladell Pier, M.D.  Electronically Signed     NJ/MEDQ  D:  11/17/2007  T:  11/17/2007  Job:  [id number]   cc:   Massie Maroon, MD  [facility]

## 2010-09-01 NOTE — Consult Note (Signed)
NAME:  Patricia, Moran                   ACCOUNT NO.:  [id number]   MEDICAL RECORD NO.:  [id number]          PATIENT TYPE:  INP   LOCATION:  2905                         FACILITY:  [facility]   PHYSICIAN:  Madolyn Frieze. Jens Som, MD, FACCDATE OF BIRTH:  Feb 03, 1930   DATE OF CONSULTATION:  11/16/2007  DATE OF DISCHARGE:                                 CONSULTATION   PRIMARY CARE PHYSICIAN:  Massie Maroon, MD   PRIMARY CARDIOLOGIST:  Jeannie Done. Jens Som, MD, [facility]   CHIEF COMPLAINT:  Chest pain.   HISTORY OF PRESENT ILLNESS:  Patricia Moran is a 77 year old female with no  previous history of coronary artery disease.  She has occasional chest  pain and takes aspirin which relieves it.  This week, she has had some  fatigue and general malaise.  She also had chest pain 4 days ago, which  was relieved by aspirin.  She saw her primary care physician in a  routine followup visit and mentioned the chest pain to him.  He was  concerned about unstable anginal pain and she was admitted to the  hospital.   Last p.m., she had a repeat episode of chest pain that lasted  approximately 20 minutes.  It started at rest.  She did not tell any of  the nursing staff about it.  It resolved without intervention.  Her  symptoms at worse are 5/10.  Not all episodes are that intense.  Her  symptoms do not change with deep inspiration and are not exertional.  Recently, she has noted that she has to rest more than previously when  she works in the yard or does housework, but she does not consistently  get chest pain with these activities.  Currently, she is resting  comfortably.   PAST MEDICAL HISTORY:  1. Status post cardiac catheterization in approximately 1991, okay per      the patient.  2. Hypertension.  3. Hyperlipidemia.  4. Recent diagnosis of diabetes.  5. Status post exercise Cardiolite in 2003.  There was no scar or      ischemia and an EF of 73%.  6. Gastroesophageal reflux disease.  7. History of  osteoporosis.  8. History of macular degeneration.  9. Anemia.   SURGICAL HISTORY:  She is status post appendectomy, hysterectomy, and  abdominal cyst removal.   ALLERGIES:  She is allergic or intolerant to SULFA, FLOXIN, TEQUIN, and  HYDROCODONE.   CURRENT MEDICATIONS:  1. Zyloprim 100 mg a day.  2. Aspirin 325 mg a day.  3. Bisoprolol/hydrochlorothiazide 2.5/6.25 mg daily.  4. Os-Cal daily.  5. Rocephin 1 g daily.  6. IV heparin.  7. Sliding scale insulin.  8. Lisinopril 10 mg a day.  9. Protonix 40 mg a day.  10.Multivitamin daily.  11.Actos 7.5 mg daily.  12.Zocor 20 mg a day.   SOCIAL HISTORY:  She lives in [place] and lives alone.  She is retired  from [employer] work.  She denies alcohol, tobacco, or drug abuse.   FAMILY HISTORY:  Her mother died at age 83 after a fall  and subsequent  head trauma.  Her father died at age 64 of an MI.  She is the oldest,  and none of her younger siblings have coronary artery disease, although  one brother has heart murmur.   REVIEW OF SYSTEMS:  She has had no fevers, chills, or sweats.  She has  had some dysuria.  She has had increased fatigue and some dyspnea on  exertion, but she does not feel this has been a sudden change.  The  chest pain is described above.  She has had no edema or palpitations or  presyncope.  She does not cough or wheeze.  Her reflux symptoms are well  controlled by the medication, and she has had no hematemesis,  hemoptysis, or melena.  Full 14-point review of systems is otherwise  negative.   PHYSICAL EXAMINATION:  VITAL SIGNS:  Temperature is 97.8, blood pressure  108/40, pulse 57, respiratory rate 15, and O2 saturation 99% on room  air.  GENERAL:  She is a well-developed and well-nourished white female, in no  acute distress.  HEENT:  Normal.  NECK:  There is no lymphadenopathy, thyromegaly, or JVD noted, but she  does have carotid bruits, right greater than left.  CV:  Heart is regular in rate  and rhythm with an S1 and S2 and no  significant murmur, rub, or gallop is noted.  Distal pulses are intact  in all four extremities and no femoral bruits are noted.  LUNGS:  Essentially clear to auscultation bilaterally.  SKIN:  No rashes or lesions are noted.  ABDOMEN:  Soft and nontender with active bowel sounds.  No  hepatosplenomegaly.  EXTREMITIES:  There is no cyanosis, clubbing, or edema noted.  MUSCULOSKELETAL:  There is no joint deformity or effusions and no spine  or CVA tenderness.  NEURO:  She is alert and oriented with cranial nerves II through XII  grossly intact.   Chest x-ray, no acute disease.   EKG, sinus bradycardia, no acute changes.   LABORATORY VALUES:  CK-MB and troponin are negative x3.  Total  cholesterol 144, triglycerides 81, HDL 37, and LDL 91.  Hemoglobin 11.0,  hematocrit 32.6, WBC 5.2, and platelets 147.  Sodium 136, potassium 3.8,  chloride 97, CO2 of 29, BUN 26, creatinine 1.16, and glucose 87.   IMPRESSION:  Patricia Moran was seen today by Dr. Jens Som.  She has a past  medical history of diabetes for approximately a year, hypertension,  hyperlipidemia, and was admitted with chest pain.  She has had  intermittent chest pain since her cath in approximately 1991.  She had a  Myoview in 2003, that showed no ischemia.  She recently had her 69-month  physical by her primary care physician and described chest pain, that  was substernal.  She also has some shortness of breath.  Her enzymes are  negative, and her EKG does not have acute changes.  She is  appropriately on aspirin, statin, and a beta-blocker as well as an ACE  inhibitor.  We will arrange an outpatient Myoview and followup.  Given  the chronicity of her symptoms, it is doubtful this is ischemic, but she  has multiple cardiac risk factors and further evaluation is warranted.       [name], PA-C      Madolyn Frieze. Jens Som, MD, [facility]  Electronically Signed    RB/MEDQ  D:  11/16/2007  T:   11/16/2007  Job:  [id number]   cc:   Massie Maroon, MD

## 2010-09-03 ENCOUNTER — Other Ambulatory Visit (HOSPITAL_COMMUNITY): Payer: Self-pay | Admitting: Oncology

## 2010-09-03 ENCOUNTER — Encounter (HOSPITAL_BASED_OUTPATIENT_CLINIC_OR_DEPARTMENT_OTHER): Payer: Medicare HMO | Admitting: Oncology

## 2010-09-03 DIAGNOSIS — D649 Anemia, unspecified: Secondary | ICD-10-CM

## 2010-09-03 DIAGNOSIS — D696 Thrombocytopenia, unspecified: Secondary | ICD-10-CM

## 2010-09-03 DIAGNOSIS — D631 Anemia in chronic kidney disease: Secondary | ICD-10-CM

## 2010-09-03 DIAGNOSIS — N039 Chronic nephritic syndrome with unspecified morphologic changes: Secondary | ICD-10-CM

## 2010-09-03 DIAGNOSIS — N189 Chronic kidney disease, unspecified: Secondary | ICD-10-CM

## 2010-09-03 LAB — CBC WITH DIFFERENTIAL/PLATELET
BASO%: 0.7 % (ref 0.0–2.0)
EOS%: 3.5 % (ref 0.0–7.0)
LYMPH%: 37.2 % (ref 14.0–49.7)
MCHC: 34.4 g/dL (ref 31.5–36.0)
MONO#: 0.3 10*3/uL (ref 0.1–0.9)
Platelets: 122 10*3/uL — ABNORMAL LOW (ref 145–400)
RBC: 3.44 10*6/uL — ABNORMAL LOW (ref 3.70–5.45)
WBC: 2.7 10*3/uL — ABNORMAL LOW (ref 3.9–10.3)

## 2010-09-03 LAB — COMPREHENSIVE METABOLIC PANEL
ALT: 10 U/L (ref 0–35)
AST: 22 U/L (ref 0–37)
Alkaline Phosphatase: 48 U/L (ref 39–117)
Calcium: 9.9 mg/dL (ref 8.4–10.5)
Chloride: 99 mEq/L (ref 96–112)
Creatinine, Ser: 1.29 mg/dL — ABNORMAL HIGH (ref 0.40–1.20)
Total Bilirubin: 0.4 mg/dL (ref 0.3–1.2)

## 2010-09-03 LAB — LACTATE DEHYDROGENASE: LDH: 154 U/L (ref 94–250)

## 2010-09-21 ENCOUNTER — Encounter: Payer: Self-pay | Admitting: Cardiology

## 2010-10-01 ENCOUNTER — Emergency Department (HOSPITAL_COMMUNITY)
Admission: EM | Admit: 2010-10-01 | Discharge: 2010-10-01 | Disposition: A | Discharge status: Home / Self Care | Payer: Medicare HMO | Attending: Emergency Medicine | Admitting: Emergency Medicine

## 2010-10-01 DIAGNOSIS — R112 Nausea with vomiting, unspecified: Secondary | ICD-10-CM | POA: Insufficient documentation

## 2010-10-01 DIAGNOSIS — I10 Essential (primary) hypertension: Secondary | ICD-10-CM | POA: Insufficient documentation

## 2010-10-01 DIAGNOSIS — R197 Diarrhea, unspecified: Secondary | ICD-10-CM | POA: Insufficient documentation

## 2010-10-01 DIAGNOSIS — R109 Unspecified abdominal pain: Secondary | ICD-10-CM | POA: Insufficient documentation

## 2010-10-01 DIAGNOSIS — E119 Type 2 diabetes mellitus without complications: Secondary | ICD-10-CM | POA: Insufficient documentation

## 2010-10-01 DIAGNOSIS — E785 Hyperlipidemia, unspecified: Secondary | ICD-10-CM | POA: Insufficient documentation

## 2010-10-01 DIAGNOSIS — Z79899 Other long term (current) drug therapy: Secondary | ICD-10-CM | POA: Insufficient documentation

## 2010-10-01 DIAGNOSIS — Z8639 Personal history of other endocrine, nutritional and metabolic disease: Secondary | ICD-10-CM | POA: Insufficient documentation

## 2010-10-01 DIAGNOSIS — Z862 Personal history of diseases of the blood and blood-forming organs and certain disorders involving the immune mechanism: Secondary | ICD-10-CM | POA: Insufficient documentation

## 2010-10-01 DIAGNOSIS — T374X4A Poisoning by anthelminthics, undetermined, initial encounter: Secondary | ICD-10-CM | POA: Insufficient documentation

## 2010-10-01 DIAGNOSIS — Z7982 Long term (current) use of aspirin: Secondary | ICD-10-CM | POA: Insufficient documentation

## 2010-10-12 ENCOUNTER — Encounter: Payer: Self-pay | Admitting: Cardiology

## 2010-10-12 ENCOUNTER — Ambulatory Visit (INDEPENDENT_AMBULATORY_CARE_PROVIDER_SITE_OTHER): Payer: Medicare HMO | Admitting: Cardiology

## 2010-10-12 DIAGNOSIS — I10 Essential (primary) hypertension: Secondary | ICD-10-CM

## 2010-10-12 DIAGNOSIS — I6529 Occlusion and stenosis of unspecified carotid artery: Secondary | ICD-10-CM

## 2010-10-12 DIAGNOSIS — E785 Hyperlipidemia, unspecified: Secondary | ICD-10-CM

## 2010-10-12 DIAGNOSIS — R55 Syncope and collapse: Secondary | ICD-10-CM

## 2010-10-12 NOTE — Assessment & Plan Note (Signed)
Blood pressure mildly elevated. We will follow and increase medications as needed. 

## 2010-10-12 NOTE — Assessment & Plan Note (Signed)
Continue statin. Lipids and liver monitored by primary care. 

## 2010-10-12 NOTE — Patient Instructions (Signed)
Your physician wants you to follow-up in: ONE YEAR WITH DR Shelda Pal will receive a reminder letter in the mail two months in advance. If you don't receive a letter, please call our office to schedule the follow-up appointment.  Your physician has requested that you have a carotid duplex. This test is an ultrasound of the carotid arteries in your neck. It looks at blood flow through these arteries that supply the brain with blood. Allow one hour for this exam. There are no restrictions or special instructions. SCHEDULE IN [place]

## 2010-10-12 NOTE — Assessment & Plan Note (Signed)
Continue aspirin and statin. Followup carotid Dopplers September 2012.

## 2010-10-12 NOTE — Assessment & Plan Note (Signed)
No further episodes. Workup unrevealing. Possible vagal episode. We will follow expectantly.

## 2010-10-12 NOTE — Progress Notes (Signed)
HPI: Pleasant female that I have seen in the past for chest pain and near syncope. Previous cardiac catheterization in 1991 apparently okay per patient. Last carotid Dopplers performed in September of 2011 revealed 40-59% right and 0-39% left stenosis and followup was recommended in one year. Myoview performed in March of 2011. Ejection fraction was 70%. There was mild apical thinning but no ischemia. An echocardiogram was also performed in March of 2011. Ejection fraction was normal. There was mild aortic and mitral regurgitation. Patient admitted in November of 2011 following a near syncopal episode that was felt to be vagal in etiology. A CardioNet monitor was performed as an outpatient and revealed sinus with pacs. I last saw her in Dec 2012. Since then, the patient denies any dyspnea on exertion, orthopnea, PND, pedal edema, palpitations, or chest pain. No further presyncopal or syncopal episodes.    Current Outpatient Prescriptions  Medication Sig Dispense Refill  . allopurinol (ZYLOPRIM) 100 MG tablet Take 100 mg by mouth daily.        Marland Kitchen aspirin 81 MG tablet Take 81 mg by mouth daily.        . Bevacizumab (AVASTIN IV) Inject into the vein. Every 7 days        . bisoprolol (ZEBETA) 5 MG tablet Take 2.5 mg by mouth daily.       . Cinnamon 500 MG capsule Take 500 mg by mouth daily.        . diazepam (VALIUM) 5 MG tablet Take 5 mg by mouth as needed.        . Iron-Vitamins (GERITOL COMPLETE) TABS Take by mouth daily.        Marland Kitchen lisinopril (PRINIVIL,ZESTRIL) 5 MG tablet Take 5 mg by mouth daily.        . meloxicam (MOBIC) 7.5 MG tablet Take 7.5 mg by mouth daily.        Marland Kitchen moxifloxacin (VIGAMOX) 0.5 % ophthalmic solution 1 drop as directed.        . Multiple Vitamins-Minerals (CENTRUM SILVER ULTRA MENS) TABS Take by mouth daily.        . Multiple Vitamins-Minerals (OCUVITE PRESERVISION) TABS Take by mouth daily.        . pioglitazone (ACTOS) 15 MG tablet Take 15 mg by mouth daily.        Bertram Gala Glycol-Propyl Glycol (SYSTANE) 0.4-0.3 % SOLN Apply to eye as directed.        . simvastatin (ZOCOR) 20 MG tablet Take 20 mg by mouth at bedtime.        Marland Kitchen DISCONTD: meclizine (ANTIVERT) 12.5 MG tablet Take 12.5 mg by mouth as needed.           Past Medical History  Diagnosis Date  . Esophageal reflux   . Other and unspecified hyperlipidemia   . Unspecified essential hypertension   . Type II or unspecified type diabetes mellitus without mention of complication, not stated as uncontrolled   . Macular degeneration (senile) of retina, unspecified   . Gout   . Osteoporosis   . Anemia     Past Surgical History  Procedure Date  . Appendectomy   . Abdominal hysterectomy     cyst removal    History   Social History  . Marital Status: Widowed    Spouse Name: N/A    Number of Children: N/A  . Years of Education: N/A   Occupational History  . Not on file.   Social History Main Topics  . Smoking status: Never  Smoker   . Smokeless tobacco: Never Used  . Alcohol Use: No  . Drug Use: No  . Sexually Active: Not on file   Other Topics Concern  . Not on file   Social History Narrative   Lives in [place] and lives alone, 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     ROS: no fevers or chills, productive cough, hemoptysis, dysphasia, odynophagia, melena, hematochezia, dysuria, hematuria, rash, seizure activity, orthopnea, PND, pedal edema, claudication. Remaining systems are negative.  Physical Exam: Well-developed well-nourished in no acute distress.  Skin is warm and dry.  HEENT is normal.  Neck is supple. No thyromegaly.  Chest is clear to auscultation with normal expansion.  Cardiovascular exam is regular rate and rhythm.  Abdominal exam nontender or distended. No masses palpated. Extremities show no edema. neuro grossly intact  ECG Sinus rhythm at a rate of 66. Occasional PACs. No significant ST changes.

## 2010-11-30 ENCOUNTER — Encounter (INDEPENDENT_AMBULATORY_CARE_PROVIDER_SITE_OTHER): Payer: Medicare HMO | Admitting: Ophthalmology

## 2010-11-30 DIAGNOSIS — H353290 Exudative age-related macular degeneration, unspecified eye, stage unspecified: Secondary | ICD-10-CM

## 2010-11-30 DIAGNOSIS — H3530 Unspecified macular degeneration: Secondary | ICD-10-CM

## 2010-11-30 DIAGNOSIS — E11319 Type 2 diabetes mellitus with unspecified diabetic retinopathy without macular edema: Secondary | ICD-10-CM

## 2010-11-30 DIAGNOSIS — H43819 Vitreous degeneration, unspecified eye: Secondary | ICD-10-CM

## 2010-12-09 ENCOUNTER — Other Ambulatory Visit (INDEPENDENT_AMBULATORY_CARE_PROVIDER_SITE_OTHER): Payer: Medicare HMO | Admitting: Ophthalmology

## 2010-12-09 DIAGNOSIS — H26499 Other secondary cataract, unspecified eye: Secondary | ICD-10-CM

## 2010-12-29 ENCOUNTER — Encounter (INDEPENDENT_AMBULATORY_CARE_PROVIDER_SITE_OTHER): Payer: Medicare HMO | Admitting: *Deleted

## 2010-12-29 DIAGNOSIS — I6529 Occlusion and stenosis of unspecified carotid artery: Secondary | ICD-10-CM

## 2011-01-14 ENCOUNTER — Encounter (INDEPENDENT_AMBULATORY_CARE_PROVIDER_SITE_OTHER): Payer: Medicare HMO | Admitting: Ophthalmology

## 2011-01-14 DIAGNOSIS — H3530 Unspecified macular degeneration: Secondary | ICD-10-CM

## 2011-01-14 DIAGNOSIS — H353290 Exudative age-related macular degeneration, unspecified eye, stage unspecified: Secondary | ICD-10-CM

## 2011-01-14 DIAGNOSIS — H2510 Age-related nuclear cataract, unspecified eye: Secondary | ICD-10-CM

## 2011-01-14 DIAGNOSIS — H43819 Vitreous degeneration, unspecified eye: Secondary | ICD-10-CM

## 2011-01-15 LAB — CBC
HCT: 32.7 — ABNORMAL LOW
HCT: 34 — ABNORMAL LOW
HCT: 34.2 — ABNORMAL LOW
Hemoglobin: 11.4 — ABNORMAL LOW
Hemoglobin: 11.5 — ABNORMAL LOW
MCHC: 33.7
MCHC: 33.8
MCV: 84.9
MCV: 85.8
Platelets: 147 — ABNORMAL LOW
Platelets: 154
RBC: 3.82 — ABNORMAL LOW
RBC: 4
RBC: 4
RDW: 13.7
RDW: 13.9
WBC: 4.3

## 2011-01-15 LAB — D-DIMER, QUANTITATIVE: D-Dimer, Quant: 0.48

## 2011-01-15 LAB — COMPREHENSIVE METABOLIC PANEL WITH GFR
Albumin: 4.1
Alkaline Phosphatase: 63
BUN: 27 — ABNORMAL HIGH
Chloride: 100
Creatinine, Ser: 1.2
Glucose, Bld: 89
Potassium: 4.4
Total Bilirubin: 1

## 2011-01-15 LAB — BASIC METABOLIC PANEL WITH GFR
BUN: 26 — ABNORMAL HIGH
Chloride: 97
GFR calc Af Amer: 55 — ABNORMAL LOW
GFR calc non Af Amer: 46 — ABNORMAL LOW
Potassium: 3.8
Sodium: 136

## 2011-01-15 LAB — PROTIME-INR
INR: 1
INR: 1
Prothrombin Time: 13.6

## 2011-01-15 LAB — BASIC METABOLIC PANEL
CO2: 29
Calcium: 9.7
Creatinine, Ser: 1.16
Glucose, Bld: 87

## 2011-01-15 LAB — HEPARIN LEVEL (UNFRACTIONATED): Heparin Unfractionated: 0.59

## 2011-01-15 LAB — COMPREHENSIVE METABOLIC PANEL
ALT: 17
ALT: 18
AST: 24
AST: 24
Albumin: 4.2
Alkaline Phosphatase: 58
CO2: 29
Calcium: 9.8
GFR calc Af Amer: 53 — ABNORMAL LOW
GFR calc Af Amer: 56 — ABNORMAL LOW
GFR calc non Af Amer: 44 — ABNORMAL LOW
Potassium: 4.2
Sodium: 136
Sodium: 137
Total Protein: 7.5
Total Protein: 7.7

## 2011-01-15 LAB — URINE CULTURE: Colony Count: >=100000

## 2011-01-15 LAB — LIPID PANEL
Cholesterol: 144
HDL: 37 — ABNORMAL LOW
Triglycerides: 81

## 2011-01-15 LAB — CK TOTAL AND CKMB (NOT AT ARMC)
CK, MB: 3.4
CK, MB: 3.9
Relative Index: 2.6 — ABNORMAL HIGH
Relative Index: 2.7 — ABNORMAL HIGH
Total CK: 130

## 2011-01-15 LAB — POCT CARDIAC MARKERS
CKMB, poc: 2.7
CKMB, poc: 3.5
Myoglobin, poc: 151
Myoglobin, poc: 166
Troponin i, poc: <0.05
Troponin i, poc: <0.05

## 2011-01-15 LAB — APTT: aPTT: 33

## 2011-01-15 LAB — URINALYSIS, ROUTINE W REFLEX MICROSCOPIC
Bilirubin Urine: NEGATIVE
Glucose, UA: NEGATIVE
Ketones, ur: NEGATIVE
Protein, ur: NEGATIVE
pH: 6.5

## 2011-01-15 LAB — CARDIAC PANEL(CRET KIN+CKTOT+MB+TROPI)
CK, MB: 2.8
CK, MB: 2.8
Relative Index: 2.5
Total CK: 110
Troponin I: 0.01

## 2011-01-15 LAB — DIFFERENTIAL
Basophils Absolute: 0
Lymphocytes Relative: 33
Neutro Abs: 2.8
Neutrophils Relative %: 54

## 2011-01-15 LAB — URINE MICROSCOPIC-ADD ON

## 2011-01-15 LAB — HEMOGLOBIN A1C
Hgb A1c MFr Bld: 6.1
Mean Plasma Glucose: 140

## 2011-01-15 LAB — TROPONIN I: Troponin I: 0.01

## 2011-02-24 NOTE — Progress Notes (Signed)
This encounter was created in error - please disregard.  This encounter was created in error - please disregard.

## 2011-03-08 ENCOUNTER — Encounter (INDEPENDENT_AMBULATORY_CARE_PROVIDER_SITE_OTHER): Payer: Medicare HMO | Admitting: Ophthalmology

## 2011-03-08 DIAGNOSIS — E1139 Type 2 diabetes mellitus with other diabetic ophthalmic complication: Secondary | ICD-10-CM

## 2011-03-08 DIAGNOSIS — H353290 Exudative age-related macular degeneration, unspecified eye, stage unspecified: Secondary | ICD-10-CM

## 2011-03-08 DIAGNOSIS — H3530 Unspecified macular degeneration: Secondary | ICD-10-CM

## 2011-03-08 DIAGNOSIS — E11319 Type 2 diabetes mellitus with unspecified diabetic retinopathy without macular edema: Secondary | ICD-10-CM

## 2011-03-26 ENCOUNTER — Telehealth: Payer: Self-pay | Admitting: Oncology

## 2011-03-26 NOTE — Telephone Encounter (Signed)
Called pt to give appt 05/14/11 @ 1.30pm r/s'd from 11/15 appt cx'd due to Epic, pt's phn rang busy. Unable to reach pt by phn, I have mailed pt an appt calendar for appt 05/14/11.

## 2011-04-19 ENCOUNTER — Other Ambulatory Visit: Payer: Self-pay | Admitting: Cardiology

## 2011-04-22 ENCOUNTER — Encounter (INDEPENDENT_AMBULATORY_CARE_PROVIDER_SITE_OTHER): Payer: Medicare HMO | Admitting: Ophthalmology

## 2011-04-22 DIAGNOSIS — H43819 Vitreous degeneration, unspecified eye: Secondary | ICD-10-CM

## 2011-04-22 DIAGNOSIS — H3530 Unspecified macular degeneration: Secondary | ICD-10-CM

## 2011-04-22 DIAGNOSIS — H2510 Age-related nuclear cataract, unspecified eye: Secondary | ICD-10-CM

## 2011-04-22 DIAGNOSIS — E1165 Type 2 diabetes mellitus with hyperglycemia: Secondary | ICD-10-CM

## 2011-04-22 DIAGNOSIS — E11319 Type 2 diabetes mellitus with unspecified diabetic retinopathy without macular edema: Secondary | ICD-10-CM

## 2011-04-22 DIAGNOSIS — E1139 Type 2 diabetes mellitus with other diabetic ophthalmic complication: Secondary | ICD-10-CM

## 2011-04-22 DIAGNOSIS — H353290 Exudative age-related macular degeneration, unspecified eye, stage unspecified: Secondary | ICD-10-CM

## 2011-04-29 ENCOUNTER — Other Ambulatory Visit: Payer: Self-pay | Admitting: Cardiology

## 2011-05-05 ENCOUNTER — Other Ambulatory Visit: Payer: Self-pay | Admitting: Internal Medicine

## 2011-05-05 DIAGNOSIS — Z1231 Encounter for screening mammogram for malignant neoplasm of breast: Secondary | ICD-10-CM

## 2011-05-12 ENCOUNTER — Encounter: Payer: Self-pay | Admitting: Medical Oncology

## 2011-05-13 ENCOUNTER — Telehealth: Payer: Self-pay | Admitting: Oncology

## 2011-05-13 NOTE — Telephone Encounter (Signed)
Pt called to cancel her appts on 05/14/2011

## 2011-05-14 ENCOUNTER — Ambulatory Visit: Payer: Medicare HMO | Admitting: Oncology

## 2011-05-14 ENCOUNTER — Other Ambulatory Visit: Payer: Medicare HMO | Admitting: Lab

## 2011-06-11 ENCOUNTER — Ambulatory Visit: Payer: Medicare HMO

## 2011-06-15 ENCOUNTER — Other Ambulatory Visit: Payer: Self-pay

## 2011-06-15 ENCOUNTER — Other Ambulatory Visit: Payer: Medicare HMO | Admitting: Lab

## 2011-06-15 ENCOUNTER — Encounter: Payer: Self-pay | Admitting: Oncology

## 2011-06-15 ENCOUNTER — Ambulatory Visit (HOSPITAL_BASED_OUTPATIENT_CLINIC_OR_DEPARTMENT_OTHER): Payer: Medicare HMO | Admitting: Oncology

## 2011-06-15 VITALS — BP 148/63 | HR 65 | Temp 98.4°F | Ht 61.0 in | Wt 133.9 lb

## 2011-06-15 DIAGNOSIS — N189 Chronic kidney disease, unspecified: Secondary | ICD-10-CM

## 2011-06-15 DIAGNOSIS — D631 Anemia in chronic kidney disease: Secondary | ICD-10-CM

## 2011-06-15 DIAGNOSIS — N039 Chronic nephritic syndrome with unspecified morphologic changes: Secondary | ICD-10-CM

## 2011-06-15 DIAGNOSIS — D61818 Other pancytopenia: Secondary | ICD-10-CM | POA: Insufficient documentation

## 2011-06-15 LAB — COMPREHENSIVE METABOLIC PANEL
ALT: 11 U/L (ref 0–35)
AST: 24 U/L (ref 0–37)
Alkaline Phosphatase: 61 U/L (ref 39–117)
BUN: 22 mg/dL (ref 6–23)
Creatinine, Ser: 1.23 mg/dL — ABNORMAL HIGH (ref 0.50–1.10)
Total Bilirubin: 0.4 mg/dL (ref 0.3–1.2)

## 2011-06-15 LAB — CBC WITH DIFFERENTIAL/PLATELET
BASO%: 0.4 % (ref 0.0–2.0)
EOS%: 2.6 % (ref 0.0–7.0)
HCT: 30.9 % — ABNORMAL LOW (ref 34.8–46.6)
LYMPH%: 29.4 % (ref 14.0–49.7)
MCH: 28.7 pg (ref 25.1–34.0)
MCHC: 33.2 g/dL (ref 31.5–36.0)
MCV: 86.6 fL (ref 79.5–101.0)
MONO%: 8.3 % (ref 0.0–14.0)
NEUT%: 59.3 % (ref 38.4–76.8)
Platelets: 154 10*3/uL (ref 145–400)
lymph#: 1.1 10*3/uL (ref 0.9–3.3)

## 2011-06-15 NOTE — Progress Notes (Signed)
This office note has been dictated.  #[id number]

## 2011-06-15 NOTE — Progress Notes (Signed)
CC:   Patricia Maroon, MD  PROBLEM LIST: 1. Anemia felt to be most likely due to renal insufficiency, first     detected in 2009.  Hemoglobin and hematocrit have been stable.  The     patient denies ever having had a blood transfusion.  She did not     have a bone marrow exam.  Workup in the past has included normal     iron, folate, B12, serum and urine protein electrophoresis as well     as an ANA.  Peripheral blood smear had been normal. 2. Intermittent mild leukopenia and thrombocytopenia felt to be most     likely due to immune dysregulation. 3. Renal insufficiency. 4. Diabetes mellitus. 5. Hypertension. 6. Dyslipidemia. 7. History of gout. 8. Macular degeneration. 9. Osteoporosis. 10.Systolic ejection murmur. 11.Arrhythmia.  MEDICATIONS:  Are reviewed and recorded.  HISTORY:  Patricia Moran is a delightful 78 year old white female who is here today by herself.  She was last seen by [place] on 09/03/2010.  Her first visit here was on 01/06/2009.  Her hemoglobin and hematocrit have been stable.  She has had a mildly fluctuating white count, ANC and platelet count.  The patient lives between [place] and [place] in her own home.  She is quite independent, drives.  She mows her own lawn.  She is really without any specific symptomatology related to her mild anemia. There have been no major changes in her health.  She is without complaints today.  PHYSICAL EXAMINATION:  The patient is quite spry, with stable weight of 133 pounds 14.4 ounces.  Height 5 feet 1 inch, body surface area 1.62 meter square.  Blood pressure 148/63.  Pulse is irregular irregular, suggestive of atrial fibrillation or a lot of atrial ectopy.  There is no scleral icterus.  Mouth and pharynx are benign.  No peripheral adenopathy palpable.  Lungs are clear to percussion and auscultation. Cardiac exam irregular rhythm as noted above, possible atrial fibrillation with soft systolic ejection murmur.  Breasts were  not examined.  The patient is going to have a mammogram tomorrow.  Abdomen is benign with no organomegaly, masses palpable.  Extremities, no peripheral edema or clubbing.  Neurologic exam is grossly normal.  The patient has no difficulty getting up and down from the examining table on her own.  LABORATORY DATA:  Today, white count 3.8, ANC 2.2, hemoglobin 10.2, hematocrit 30.9, platelets 154,000.  Blood counts have been fairly stable with some minor fluctuations dating back to her first visit with me on 01/06/2009.  Chemistries today are pending.  Chemistries from 09/03/2010 were notable for a BUN of 25, creatinine 1.29.  Rest of the chemistries are normal.  IMAGING STUDIES:  Digital screening mammogram with CAD on 06/09/2010 showed no evidence for malignancy.  IMPRESSION AND PLAN:  Patricia Moran continues to do well under observation. She remains with mild stable anemia with hemoglobin today of 10.2, hematocrit 30.9.  The patient's hemoglobin and hematocrit have been stable now since 2009.  I suspect the most likely explanation for her mild anemia is her renal insufficiency.  She is asymptomatic, really does not need any erythropoietin stimulating agents.  At this point I do not think Patricia Moran needs to be followed by [place] on a regular basis.  I would certainly be happy to see her again should any changes occur in her condition or should any questions or concerns arise in the future.  The patient was given a copy of her recent CBCs  dating back to 03/06/2010.    ______________________________ Samul Dada, M.D. DSM/MEDQ  D:  06/15/2011  T:  06/15/2011  Job:  [id number]

## 2011-06-16 ENCOUNTER — Ambulatory Visit
Admission: RE | Admit: 2011-06-16 | Discharge: 2011-06-16 | Disposition: A | Discharge status: Home / Self Care | Payer: Medicare HMO | Source: Ambulatory Visit | Attending: Internal Medicine | Admitting: Internal Medicine

## 2011-06-16 DIAGNOSIS — Z1231 Encounter for screening mammogram for malignant neoplasm of breast: Secondary | ICD-10-CM

## 2011-06-29 ENCOUNTER — Inpatient Hospital Stay (HOSPITAL_COMMUNITY)
Admission: EM | Admit: 2011-06-29 | Discharge: 2011-07-01 | DRG: 287 | Disposition: A | Discharge status: Home / Self Care | Payer: Medicare HMO | Attending: Cardiology | Admitting: Cardiology

## 2011-06-29 ENCOUNTER — Emergency Department (HOSPITAL_COMMUNITY): Payer: Medicare HMO

## 2011-06-29 ENCOUNTER — Encounter (HOSPITAL_COMMUNITY): Payer: Self-pay

## 2011-06-29 ENCOUNTER — Other Ambulatory Visit: Payer: Self-pay

## 2011-06-29 DIAGNOSIS — N039 Chronic nephritic syndrome with unspecified morphologic changes: Secondary | ICD-10-CM | POA: Diagnosis present

## 2011-06-29 DIAGNOSIS — D649 Anemia, unspecified: Secondary | ICD-10-CM | POA: Diagnosis present

## 2011-06-29 DIAGNOSIS — Z79899 Other long term (current) drug therapy: Secondary | ICD-10-CM

## 2011-06-29 DIAGNOSIS — I658 Occlusion and stenosis of other precerebral arteries: Secondary | ICD-10-CM | POA: Diagnosis present

## 2011-06-29 DIAGNOSIS — M79606 Pain in leg, unspecified: Secondary | ICD-10-CM

## 2011-06-29 DIAGNOSIS — E119 Type 2 diabetes mellitus without complications: Secondary | ICD-10-CM | POA: Diagnosis present

## 2011-06-29 DIAGNOSIS — R079 Chest pain, unspecified: Secondary | ICD-10-CM

## 2011-06-29 DIAGNOSIS — M109 Gout, unspecified: Secondary | ICD-10-CM | POA: Diagnosis present

## 2011-06-29 DIAGNOSIS — I080 Rheumatic disorders of both mitral and aortic valves: Secondary | ICD-10-CM | POA: Insufficient documentation

## 2011-06-29 DIAGNOSIS — K219 Gastro-esophageal reflux disease without esophagitis: Secondary | ICD-10-CM | POA: Diagnosis present

## 2011-06-29 DIAGNOSIS — N289 Disorder of kidney and ureter, unspecified: Secondary | ICD-10-CM | POA: Diagnosis present

## 2011-06-29 DIAGNOSIS — I200 Unstable angina: Secondary | ICD-10-CM | POA: Diagnosis present

## 2011-06-29 DIAGNOSIS — I6529 Occlusion and stenosis of unspecified carotid artery: Secondary | ICD-10-CM | POA: Diagnosis present

## 2011-06-29 DIAGNOSIS — H3530 Unspecified macular degeneration: Secondary | ICD-10-CM | POA: Diagnosis present

## 2011-06-29 DIAGNOSIS — I10 Essential (primary) hypertension: Secondary | ICD-10-CM | POA: Diagnosis present

## 2011-06-29 DIAGNOSIS — M810 Age-related osteoporosis without current pathological fracture: Secondary | ICD-10-CM | POA: Diagnosis present

## 2011-06-29 DIAGNOSIS — I2510 Atherosclerotic heart disease of native coronary artery without angina pectoris: Principal | ICD-10-CM | POA: Diagnosis present

## 2011-06-29 DIAGNOSIS — E785 Hyperlipidemia, unspecified: Secondary | ICD-10-CM | POA: Diagnosis present

## 2011-06-29 DIAGNOSIS — M79609 Pain in unspecified limb: Secondary | ICD-10-CM

## 2011-06-29 DIAGNOSIS — D631 Anemia in chronic kidney disease: Secondary | ICD-10-CM | POA: Diagnosis present

## 2011-06-29 HISTORY — DX: Peripheral vascular disease, unspecified: I73.9

## 2011-06-29 HISTORY — DX: Atherosclerotic heart disease of native coronary artery without angina pectoris: I25.10

## 2011-06-29 HISTORY — DX: Disorder of arteries and arterioles, unspecified: I77.9

## 2011-06-29 LAB — URINALYSIS, ROUTINE W REFLEX MICROSCOPIC
Glucose, UA: NEGATIVE mg/dL
Hgb urine dipstick: NEGATIVE
Ketones, ur: NEGATIVE mg/dL
Protein, ur: NEGATIVE mg/dL
Urobilinogen, UA: 0.2 mg/dL (ref 0.0–1.0)

## 2011-06-29 LAB — URINE MICROSCOPIC-ADD ON

## 2011-06-29 LAB — CBC
HCT: 29.4 % — ABNORMAL LOW (ref 36.0–46.0)
MCHC: 32.7 g/dL (ref 30.0–36.0)
MCV: 87.8 fL (ref 78.0–100.0)
Platelets: 182 10*3/uL (ref 150–400)
RDW: 14.9 % (ref 11.5–15.5)
WBC: 5.4 10*3/uL (ref 4.0–10.5)

## 2011-06-29 LAB — COMPREHENSIVE METABOLIC PANEL
Alkaline Phosphatase: 69 U/L (ref 39–117)
BUN: 30 mg/dL — ABNORMAL HIGH (ref 6–23)
CO2: 29 mEq/L (ref 19–32)
Chloride: 101 mEq/L (ref 96–112)
Creatinine, Ser: 1.06 mg/dL (ref 0.50–1.10)
GFR calc non Af Amer: 49 mL/min — ABNORMAL LOW (ref >90)
Potassium: 4.2 mEq/L (ref 3.5–5.1)
Total Bilirubin: 0.3 mg/dL (ref 0.3–1.2)

## 2011-06-29 LAB — CARDIAC PANEL(CRET KIN+CKTOT+MB+TROPI)
CK, MB: 2.4 ng/mL (ref 0.3–4.0)
Relative Index: INVALID (ref 0.0–2.5)
Total CK: 88 U/L (ref 7–177)

## 2011-06-29 LAB — DIFFERENTIAL
Basophils Absolute: 0 10*3/uL (ref 0.0–0.1)
Eosinophils Absolute: 0.1 10*3/uL (ref 0.0–0.7)
Lymphs Abs: 1.1 10*3/uL (ref 0.7–4.0)
Monocytes Absolute: 0.3 10*3/uL (ref 0.1–1.0)
Monocytes Relative: 6 % (ref 3–12)
Neutro Abs: 3.9 10*3/uL (ref 1.7–7.7)

## 2011-06-29 LAB — D-DIMER, QUANTITATIVE: D-Dimer, Quant: 0.81 ug/mL-FEU — ABNORMAL HIGH (ref 0.00–0.48)

## 2011-06-29 MED ORDER — INSULIN ASPART 100 UNIT/ML ~~LOC~~ SOLN: 0.0000 [IU] | Freq: Three times a day (TID) | SUBCUTANEOUS | Status: DC

## 2011-06-29 MED ORDER — SODIUM CHLORIDE 0.9 % IV SOLN
INTRAVENOUS | Status: DC
  Administered 2011-06-30: 04:00:00 via INTRAVENOUS

## 2011-06-29 MED ORDER — SODIUM CHLORIDE 0.9 % IJ SOLN
3.0000 mL | Freq: Two times a day (BID) | INTRAMUSCULAR | Status: DC
  Administered 2011-06-29 – 2011-07-01 (×4): 3 mL via INTRAVENOUS

## 2011-06-29 MED ORDER — POLYVINYL ALCOHOL 1.4 % OP SOLN
1.0000 [drp] | Freq: Four times a day (QID) | OPHTHALMIC | Status: DC | PRN
  Administered 2011-06-30: 1 [drp] via OPHTHALMIC
  Filled 2011-06-29: qty 15

## 2011-06-29 MED ORDER — NITROGLYCERIN 0.4 MG SL SUBL: 0.4000 mg | SUBLINGUAL_TABLET | SUBLINGUAL | Status: DC | PRN

## 2011-06-29 MED ORDER — SODIUM CHLORIDE 0.9 % IJ SOLN: 3.0000 mL | INTRAMUSCULAR | Status: DC | PRN

## 2011-06-29 MED ORDER — ADULT MULTIVITAMIN W/MINERALS CH
1.0000 | ORAL_TABLET | Freq: Every day | ORAL | Status: DC
Start: 2011-06-30 — End: 2011-07-01
  Administered 2011-06-30 – 2011-07-01 (×2): 1 via ORAL
  Filled 2011-06-29 (×2): qty 1

## 2011-06-29 MED ORDER — DIAZEPAM 5 MG PO TABS
5.0000 mg | ORAL_TABLET | Freq: Every day | ORAL | Status: DC | PRN
  Administered 2011-06-29 – 2011-06-30 (×2): 5 mg via ORAL
  Filled 2011-06-29 (×2): qty 1

## 2011-06-29 MED ORDER — BISOPROLOL FUMARATE 5 MG PO TABS
2.5000 mg | ORAL_TABLET | Freq: Two times a day (BID) | ORAL | Status: DC
  Administered 2011-06-30 – 2011-07-01 (×3): 2.5 mg via ORAL
  Filled 2011-06-29 (×5): qty 0.5

## 2011-06-29 MED ORDER — POLYETHYL GLYCOL-PROPYL GLYCOL 0.4-0.3 % OP SOLN: 1.0000 [drp] | Freq: Four times a day (QID) | OPHTHALMIC | Status: DC | PRN

## 2011-06-29 MED ORDER — IOHEXOL 300 MG/ML  SOLN
80.0000 mL | Freq: Once | INTRAMUSCULAR | Status: AC | PRN
  Administered 2011-06-29: 80 mL via INTRAVENOUS

## 2011-06-29 MED ORDER — GERITOL COMPLETE PO TABS: 1.0000 | ORAL_TABLET | Freq: Every day | ORAL | Status: DC

## 2011-06-29 MED ORDER — ACETAMINOPHEN 325 MG PO TABS: 650.0000 mg | ORAL_TABLET | ORAL | Status: DC | PRN

## 2011-06-29 MED ORDER — HEPARIN BOLUS VIA INFUSION
3000.0000 [IU] | Freq: Once | INTRAVENOUS | Status: AC
  Administered 2011-06-30: 3000 [IU] via INTRAVENOUS

## 2011-06-29 MED ORDER — SODIUM CHLORIDE 0.9 % IV SOLN: 250.0000 mL | INTRAVENOUS | Status: DC | PRN

## 2011-06-29 MED ORDER — SIMVASTATIN 20 MG PO TABS
20.0000 mg | ORAL_TABLET | Freq: Every day | ORAL | Status: DC
  Administered 2011-06-30 (×2): 20 mg via ORAL
  Filled 2011-06-29 (×3): qty 1

## 2011-06-29 MED ORDER — LISINOPRIL 10 MG PO TABS: 10.0000 mg | ORAL_TABLET | Freq: Every day | ORAL | Status: DC

## 2011-06-29 MED ORDER — ONDANSETRON HCL 4 MG/2ML IJ SOLN: 4.0000 mg | Freq: Four times a day (QID) | INTRAMUSCULAR | Status: DC | PRN

## 2011-06-29 MED ORDER — ASPIRIN 81 MG PO CHEW
324.0000 mg | CHEWABLE_TABLET | ORAL | Status: AC
  Administered 2011-06-30: 324 mg via ORAL
  Filled 2011-06-29: qty 1

## 2011-06-29 MED ORDER — ASPIRIN EC 81 MG PO TBEC
81.0000 mg | DELAYED_RELEASE_TABLET | Freq: Every day | ORAL | Status: DC
  Administered 2011-06-30 – 2011-07-01 (×2): 81 mg via ORAL
  Filled 2011-06-29 (×2): qty 1

## 2011-06-29 MED ORDER — HEPARIN (PORCINE) IN NACL 100-0.45 UNIT/ML-% IJ SOLN
700.0000 [IU]/h | INTRAMUSCULAR | Status: DC
  Administered 2011-06-30: 700 [IU]/h via INTRAVENOUS
  Filled 2011-06-29 (×2): qty 250

## 2011-06-29 MED ORDER — ALLOPURINOL 100 MG PO TABS
100.0000 mg | ORAL_TABLET | Freq: Every day | ORAL | Status: DC
  Administered 2011-06-30 – 2011-07-01 (×2): 100 mg via ORAL
  Filled 2011-06-29 (×2): qty 1

## 2011-06-29 NOTE — ED Notes (Signed)
2003-01 Ready 

## 2011-06-29 NOTE — ED Notes (Signed)
Vascular tech, Dois Davenport, her to do testing on pt.

## 2011-06-29 NOTE — ED Provider Notes (Addendum)
History     CSN: [id number]  Arrival date & time 06/29/11  1605   First MD Initiated Contact with Patient 06/29/11 1610      Chief Complaint  Patient presents with  . Chest Pain    Rt side CP radiating to rt shoulder beginning just over one hour ago.  Pt states it started as 10/10, then 6/10, now 3/10.  Pt was given ASA 324.  Pt refused nitro because she has passed out with it before.  CBG 95, 100% on 2L.      (Consider location/radiation/quality/duration/timing/severity/associated sxs/prior treatment) HPI Comments: Pt w a hx of HLD, HTN, T2DM, osteoperosis No cardiac history, Dr. Jens Som, irregular heart beat  Pt hx says CA, but she denies a hx of CA. Dr. Arline Asp (heme-onc) has been following pts blood work for 2 1/2 years for anemia/pancytopenia to evaluate for possible leukemia. Blood work is stable and chronic, but with out evidence of cancer source. She denies radiation or chemotherapy.    Patient is a 78 y.o. female presenting with chest pain. The history is provided by the patient.  Chest Pain The chest pain began 1 - 2 hours ago. Chest pain occurs constantly. The chest pain is improving. Associated with: no exaserbating s/s. At its most intense, the pain is at 10/10. The pain is currently at 3/10. The severity of the pain is mild. The quality of the pain is described as dull and aching. The pain radiates to the epigastrium. Exacerbated by: none  Primary symptoms include shortness of breath and palpitations. Pertinent negatives for primary symptoms include no fever, no fatigue, no syncope, no cough, no wheezing, no abdominal pain, no nausea, no vomiting, no dizziness and no altered mental status.  The palpitations also occurred with shortness of breath. The palpitations did not occur with dizziness.   Pertinent negatives for associated symptoms include no claudication, no diaphoresis, no lower extremity edema, no near-syncope, no numbness, no orthopnea, no paroxysmal nocturnal  dyspnea and no weakness. Associated symptoms comments: Left leg behind knee started hurting wed, about a week. Using a cane bc of pain hurting so bad. . She tried aspirin (melican x 4 days, 1 time a day for knee pain) for the symptoms. Risk factors include being elderly and lack of exercise.  Her past medical history is significant for arrhythmia, diabetes, DVT, hyperlipidemia and hypertension.  Pertinent negatives for past medical history include no aneurysm, no anxiety/panic attacks, no aortic aneurysm, no aortic dissection, no bicuspid aortic valve, no CAD, no cancer, no congenital heart disease, no connective tissue disease, no COPD, no CHF, no MI, no mitral valve prolapse, no pacemaker, no PE, no PVD, no recent injury, no seizures, no strokes, Turner syndrome and no valve disorder.  Her family medical history is significant for CAD in family, diabetes in family, heart disease in family, hyperlipidemia in family and hypertension in family.  Pertinent negatives for family medical history include: no early MI in family, no PE in family, no PVD in family, no stroke in family and no sudden death in family.  Procedure history is positive for cardiac catheterization (Dr. Jens Som, 1991, no CAD), stress echo and exercise treadmill test [facility], no abdnormal findings, done 2 years ago ). Procedure history comments: Carotids checked 6 mo ago .     Past Medical History  Diagnosis Date  . Esophageal reflux   . Other and unspecified hyperlipidemia   . Unspecified essential hypertension   . Type II or unspecified type diabetes  mellitus without mention of complication, not stated as uncontrolled   . Macular degeneration (senile) of retina, unspecified   . Osteoporosis   . Anemia   . Pancytopenia 2010  . Dyslipidemia   . Gout   . Anemia, unspecified   . Cancer     Past Surgical History  Procedure Date  . Appendectomy     age 26  . Abdominal hysterectomy     cyst removal    Family  History  Problem Relation Age of Onset  . Heart attack Father   . Coronary artery disease Other     younger siblings  . Cancer Maternal Aunt     GYN    History  Substance Use Topics  . Smoking status: Never Smoker   . Smokeless tobacco: Never Used  . Alcohol Use: No    OB History    Grav Para Term Preterm Abortions TAB SAB Ect Mult Living                  Review of Systems  Constitutional: Negative for fever, diaphoresis and fatigue.  Respiratory: Positive for shortness of breath. Negative for cough and wheezing.   Cardiovascular: Positive for chest pain and palpitations. Negative for orthopnea, claudication, syncope and near-syncope.  Gastrointestinal: Negative for nausea, vomiting and abdominal pain.  Neurological: Negative for dizziness, seizures, weakness and numbness.  Psychiatric/Behavioral: Negative for altered mental status.    Allergies  Hydrocodone; Nitrofuran derivatives; Ofloxacin; and Sulfonamide derivatives  Home Medications   Current Outpatient Rx  Name Route Sig Dispense Refill  . ALLOPURINOL 100 MG PO TABS Oral Take 100 mg by mouth daily.      . ASPIRIN 81 MG PO TABS Oral Take 81 mg by mouth daily.      Marland Kitchen BISOPROLOL FUMARATE 5 MG PO TABS  TAKE 1/2 TABLET BY MOUTH ONCE DAILY 90 tablet 2  . CALTRATE 600 PO Oral Take 1 tablet by mouth daily.    Marland Kitchen CINNAMON 500 MG PO CAPS Oral Take 1,000 mg by mouth 2 (two) times daily.     Marland Kitchen GERITOL COMPLETE PO TABS Oral Take by mouth daily.      Marland Kitchen LISINOPRIL 10 MG PO TABS Oral Take 10 mg by mouth daily.    . MELOXICAM 7.5 MG PO TABS Oral Take 7.5 mg by mouth daily as needed.     Marland Kitchen MOXIFLOXACIN HCL 0.5 % OP SOLN  1 drop as directed. Takes with injection in the eye    . CENTRUM SILVER ULTRA MENS PO TABS Oral Take by mouth daily.      Idolina Primer PRESERVISION PO TABS Oral Take by mouth daily.      Marland Kitchen VITAMIN D MAINTENANCE PO Oral Take 1 tablet by mouth daily.    Marland Kitchen PIOGLITAZONE HCL 15 MG PO TABS Oral Take 30 mg by mouth daily.      Marland Kitchen POLYETHYL GLYCOL-PROPYL GLYCOL 0.4-0.3 % OP SOLN Ophthalmic Apply to eye as directed.      Marland Kitchen SIMVASTATIN 20 MG PO TABS Oral Take 20 mg by mouth at bedtime.      . AVASTIN IV Intravenous Inject into the vein. Injection in left eye. Every 14 weeks.     Marland Kitchen DIAZEPAM 5 MG PO TABS Oral Take 5 mg by mouth as needed.        BP 164/43  Pulse 72  Temp(Src) 97.8 F (36.6 C) (Oral)  Resp 16  SpO2 100%  Physical Exam  Nursing note and vitals reviewed. Constitutional:  She appears well-developed and well-nourished. No distress.  HENT:  Head: Normocephalic and atraumatic.  Eyes: Conjunctivae and EOM are normal. Pupils are equal, round, and reactive to light.  Neck: Normal range of motion. Neck supple. Normal carotid pulses and no JVD present. Carotid bruit is not present. No rigidity. Normal range of motion present.  Cardiovascular: Normal rate, regular rhythm, S1 normal, S2 normal, normal heart sounds, intact distal pulses and normal pulses.  Exam reveals no gallop and no friction rub.   No murmur heard.      No pitting edema bilaterally, RRR, no aberrant sounds on auscultations, distal pulses intact, no carotid bruit or JVD.   Pulmonary/Chest: Effort normal and breath sounds normal. No accessory muscle usage or stridor. No respiratory distress. She exhibits no tenderness and no bony tenderness.  Abdominal: Bowel sounds are normal.       Soft non tender. Non pulsatile aorta.   Skin: Skin is warm, dry and intact. No rash noted. She is not diaphoretic. No cyanosis. Nails show no clubbing.    ED Course  Procedures (including critical care time)  Labs Reviewed  COMPREHENSIVE METABOLIC PANEL - Abnormal; Notable for the following:    BUN 30 (*)    Albumin 3.4 (*)    GFR calc non Af Amer 49 (*)    GFR calc Af Amer 56 (*)    All other components within normal limits  CBC - Abnormal; Notable for the following:    RBC 3.35 (*)    Hemoglobin 9.6 (*)    HCT 29.4 (*)    All other components  within normal limits  D-DIMER, QUANTITATIVE - Abnormal; Notable for the following:    D-Dimer, Quant 0.81 (*)    All other components within normal limits  DIFFERENTIAL  POCT I-STAT TROPONIN I  URINALYSIS, ROUTINE W REFLEX MICROSCOPIC  TROPONIN I   Dg Chest Portable 1 View  06/29/2011  *RADIOLOGY REPORT*  Clinical Data: Chest pain, shortness of breath  PORTABLE CHEST - 1 VIEW  Comparison: 03/08/2010  Findings: Mild cardiomegaly again noted.  No acute infiltrate or pleural effusion.  No pulmonary edema.  Bony thorax is stable.  IMPRESSION: Mild cardiomegaly is stable.  No acute infiltrate or pleural effusion.  Original Report Authenticated By: Natasha Mead, M.D.     No diagnosis found.   Date: 06/29/2011  Rate: 90  Rhythm: normal sinus rhythm  QRS Axis: right  Intervals: borderline prolonged QT (QT 404 ms; QTc )  ST/T Wave abnormalities: nonspecific T wave changes, ant leads  Conduction Disutrbances:none  Narrative Interpretation:   Old EKG Reviewed: changes noted    MDM  Chest pain, Leg pain (left)   Spoke with Dr. Daleen Squibb of [facility] who will see pt in the ED and admit for Dr. Jens Som. Pt stable with no current CP or complaints.         Jaci Carrel, PA-C 06/29/11 1839  Jaci Carrel, PA-C 07/01/11 0058  Jaci Carrel, PA-C 07/23/11 1413

## 2011-06-29 NOTE — H&P (Signed)
History and Physical  Patient ID: Patricia Moran MRN: [id number], SOB: Jul 19, 1929 78 y.o. Date of Encounter: 06/29/2011, 7:32 PM  Primary Physician: Pearson Grippe, MD, MD Primary Cardiologist: Dr. Jens Som  Chief Complaint: chest pain  HPI: 78 y/o F with hx of reflux, HTN, HL, carotid disease presented to [facility] with complaints of chest pain. While paying bills today at rest she developed substernal chest pain that radiated to her right shoulder and through her back  She tried laying back in a recliner without any relief. 324mg  of ASA also did not help so she called her daughter, and they ultimately decided to call 911. She denies any CP or increased pain on inspiration. She was given something in the ambulance that helped with the pain but she is not sure what it was called. Her LLE has also been giving her pain since 06/23/11 - LE dopplers in the ER were negative for DVT. No hx of cancer or recent trauma. CT angio showed no PE but did show coronary atherosclerosis with probable three-vessel distribution of calcified plaque, most prominent by CT at the level of the LAD. CXR showed stable mild cardiomegaly. POC troponin negative x 2 but EKG with anterior TWI. She is completely pain free and symptom free at present - she feels "fit as a fiddle."  Past Medical History  Diagnosis Date  . Esophageal reflux   . Other and unspecified hyperlipidemia   . Unspecified essential hypertension   . Type II or unspecified type diabetes mellitus without mention of complication, not stated as uncontrolled   . Macular degeneration (senile) of retina, unspecified   . Osteoporosis   . Anemia     Felt secondary renal insuffiency, seen by Dr. Arline Asp (not clear etiology)  . Pancytopenia 2010  . Dyslipidemia   . Gout   . Carotid arterial disease     Last dopplers 12/2010 - RICA 40-59%, LICA 0-39% for recheck in 1 year    Prior heart studies per Dr. Ludwig Clarks note:  - remote cath in 1990s okay per  patient - Myoview performed in March of 2011. Ejection fraction was 70%. There was mild apical thinning but no ischemia.  -  - 2D echo 07/14/09 Study Conclusions Left ventricle: The cavity size was normal. Trevante Tennell thickness was normal. Systolic function was normal. The estimated ejection fraction was in the range of 55% to 60%. Doppler parameters are consistent with abnormal left ventricular relaxation (grade 1 diastolic dysfunction). Aortic valve: Mild regurgitation. Mitral valve: Mild regurgitation. - Syncope 02/2010 following near syncopal episode felt vagal in etiology with OP CardioNet monitor was performed as an outpatient and revealed sinus with pacs.   Surgical History:  Past Surgical History  Procedure Date  . Appendectomy     age 76  . Abdominal hysterectomy     cyst removal     Home Meds: Prior to Admission medications   Medication Sig Start Date End Date Taking? Authorizing Provider  allopurinol (ZYLOPRIM) 100 MG tablet Take 100 mg by mouth daily.     Yes Historical Provider, MD  aspirin 81 MG tablet Take 81 mg by mouth daily.     Yes Historical Provider, MD  bisoprolol (ZEBETA) 5 MG tablet TAKE 1/2 TABLET BY MOUTH ONCE DAILY 04/29/11  Yes Lewayne Bunting, MD  Calcium Carbonate (CALTRATE 600 PO) Take 1 tablet by mouth daily.   Yes Historical Provider, MD  Cinnamon 500 MG capsule Take 1,000 mg by mouth 2 (two) times daily.  Yes Historical Provider, MD  Iron-Vitamins (GERITOL COMPLETE) TABS Take by mouth daily.     Yes Historical Provider, MD  lisinopril (PRINIVIL,ZESTRIL) 10 MG tablet Take 10 mg by mouth daily.   Yes Historical Provider, MD  meloxicam (MOBIC) 7.5 MG tablet Take 7.5 mg by mouth daily as needed.    Yes Historical Provider, MD  moxifloxacin (VIGAMOX) 0.5 % ophthalmic solution 1 drop as directed. Takes with injection in the eye   Yes Historical Provider, MD  Multiple Vitamins-Minerals (CENTRUM SILVER ULTRA MENS) TABS Take by mouth daily.     Yes Historical Provider, MD   Multiple Vitamins-Minerals (OCUVITE PRESERVISION) TABS Take by mouth daily.     Yes Historical Provider, MD  Nutritional Supplements (VITAMIN D MAINTENANCE PO) Take 1 tablet by mouth daily.   Yes Historical Provider, MD  pioglitazone (ACTOS) 15 MG tablet Take 30 mg by mouth daily.    Yes Historical Provider, MD  Polyethyl Glycol-Propyl Glycol (SYSTANE) 0.4-0.3 % SOLN Apply to eye as directed.     Yes Historical Provider, MD  simvastatin (ZOCOR) 20 MG tablet Take 20 mg by mouth at bedtime.     Yes Historical Provider, MD  Bevacizumab (AVASTIN IV) Inject into the vein. Injection in left eye. Every 14 weeks.     Historical Provider, MD  diazepam (VALIUM) 5 MG tablet Take 5 mg by mouth as needed.      Historical Provider, MD    Allergies:  Allergies  Allergen Reactions  . Hydrocodone   . Nitrofuran Derivatives     Pt states she can't take nitro because it makes her pass out every time.  . Ofloxacin   . Sulfonamide Derivatives     History   Social History  . Marital Status: Widowed    Spouse Name: N/A    Number of Children: N/A  . Years of Education: N/A   Occupational History  . Not on file.   Social History Main Topics  . Smoking status: Never Smoker   . Smokeless tobacco: Never Used  . Alcohol Use: No  . Drug Use: No  . Sexually Active: Not on file   Other Topics Concern  . 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      Family History  Problem Relation Age of Onset  . Heart attack Father   . Coronary artery disease Other     younger siblings  . Cancer Maternal Aunt     GYN   Review of Systems: General: negative for chills, fever, night sweats or weight changes.  Cardiovascular: negative for edema, orthopnea, palpitations, paroxysmal nocturnal dyspnea, shortness of breath Dermatological: negative for rash Respiratory: negative for cough or wheezing Urologic: negative for hematuria Abdominal:  negative for nausea, vomiting, diarrhea, bright red blood per rectum, melena, or hematemesis Neurologic: negative for visual changes, syncope, or dizziness All other systems reviewed and are otherwise negative except as noted above.  Labs:   Lab Results  Component Value Date   WBC 5.4 06/29/2011   HGB 9.6* 06/29/2011   HCT 29.4* 06/29/2011   MCV 87.8 06/29/2011   PLT 182 06/29/2011     Lab 06/29/11 1702  NA 139  K 4.2  CL 101  CO2 29  BUN 30*  CREATININE 1.06  CALCIUM 9.8  PROT 7.1  BILITOT 0.3  ALKPHOS 69  ALT 11  AST 28  GLUCOSE 89   POC troponin negative x 2  Lab Results  Component Value Date   DDIMER 0.81* 06/29/2011    Radiology/Studies:  1. Ct Angio Chest W/cm &/or Wo Cm 06/29/2011  *RADIOLOGY REPORT*  Clinical Data: Chest pain, shortness of breath and palpitations.  CT ANGIOGRAPHY CHEST  Technique:  Multidetector CT imaging of the chest using the standard protocol during bolus administration of intravenous contrast. Multiplanar reconstructed images including MIPs were obtained and reviewed to evaluate the vascular anatomy.  Contrast: 80mL OMNIPAQUE IOHEXOL 300 MG/ML IJ SOLN  Comparison: None.  Findings: The pulmonary arteries are adequately opacified.  There is no evidence of pulmonary embolism.  Heavily calcified atherosclerotic plaque is noted in the distribution of the LAD and also potentially in the left circumflex coronary artery.  The right coronary artery is not well visualized due to motion.  However, there does appear to be potentially calcified plaque in the RCA. Atherosclerosis also noted at the level of the aortic arch and origin of the left subclavian artery.  The thoracic aorta is under opacified but does not appear aneurysmal.  Lungs show no evidence of infiltrate or edema.  There is atelectasis and scarring in both lung bases.  No pleural or pericardial fluid identified.  The heart is felt to be mildly enlarged.  No nodules or enlarged lymph nodes.  Upper  abdominal aorta demonstrates atherosclerotic disease with calcified plaque noted at the origins of both the celiac axis and superior mesenteric artery. Degenerative changes are present in the spine.  IMPRESSION:  1.  No evidence of acute pulmonary embolism. 2.  Coronary atherosclerosis with probable three-vessel distribution of calcified plaque.  This is most prominent by CT at the level of the LAD.  Original Report Authenticated By: Reola Calkins, M.D.   2. Chest Portable 1 View 06/29/2011  *RADIOLOGY REPORT*  Clinical Data: Chest pain, shortness of breath  PORTABLE CHEST - 1 VIEW  Comparison: 03/08/2010  Findings: Mild cardiomegaly again noted.  No acute infiltrate or pleural effusion.  No pulmonary edema.  Bony thorax is stable.  IMPRESSION: Mild cardiomegaly is stable.  No acute infiltrate or pleural effusion.  Original Report Authenticated By: Natasha Mead, M.D.   EKG: NSR 90bpm TWI anteriorly V1-V3  Physical Exam: Blood pressure 143/43, pulse 86, temperature 98 F (36.7 C), temperature source Oral, resp. rate 18, SpO2 100.00%. General: Well developed, well nourished, in no acute distress. Head: Normocephalic, atraumatic, sclera non-icteric, no xanthomas, nares are without discharge.  Neck: + R carotid bruits. JVD not elevated. Lungs: Decreased BS R base. + adventitious sounds heard at bases, no overt rales or rhonchi. Breathing is unlabored. Heart: RRR with S1 S2. No murmurs, rubs, or gallops appreciated. Abdomen: Soft, non-tender, non-distended with normoactive bowel sounds. No hepatomegaly. No rebound/guarding. No obvious abdominal masses. Msk:  Strength and tone appear normal for age. Extremities: No clubbing or cyanosis. No edema.  Distal pedal pulses are 2+ and equal bilaterally. Mild tenderness lateral left knee/shin/calf without swelling or erythema. Neuro: Alert and oriented X 3. Moves all extremities spontaneously. Psych:  Responds to questions appropriately with a normal affect.     ASSESSMENT AND PLAN:   1. Chest pain worrisome for unstable angina - presence of CAD noted on CT angio, episode today worrisome for angina. Troponins negative thus far. Admit, cycle enzymes, continue ASA/BB/statin and initiate IV heparin per pharmacy. Keep NPO after midnight for consideration for cath tomorrow, sooner if pain recurs.  2. Dyslipidemia - continue statin, check FLP  3. Known carotid disease - stable 12/2010 for dopplers  in 1 year  4. Chronic anemia with unclear etiology but no s/sx of active bleeding - mildly decreased Hgb. Per pt, was recently told she no longer needed to follow-up with Dr. Arline Asp. Follow.  Signed, Dayna Dunn PA-C 06/29/2011, 7:32 PM  I have taken a history, reviewed medications, allergies, PMH, SH, FH, and reviewed ROS and examined the patient.  I agree with the assessment and plan. I am concerned this was an ACS in the absence of pulmonary embolus on CT. Discussed indication, potential benefit, risks with patient and family and they agree to proceed.  Jakya Dovidio C. Daleen Squibb, MD, [facility] Pager:  [phone]

## 2011-06-29 NOTE — ED Notes (Signed)
Pt ambulated to and from RR with steady gait for CCUS.

## 2011-06-29 NOTE — ED Notes (Signed)
Pt returned from CT °

## 2011-06-29 NOTE — Progress Notes (Signed)
ANTICOAGULATION CONSULT NOTE - Initial Consult  Pharmacy Consult for Heparin Indication: chest pain/ACS  Patient Measurements:  132 lbs and 81ft 1.5 in   Heparin Dosing Weight: 62 kg  Vital Signs: Temp: 98 F (36.7 C) (03/12 1905) Temp src: Oral (03/12 1614) BP: 164/62 mmHg (03/12 1930) Pulse Rate: 77  (03/12 2000)  Labs:  Basename 06/29/11 1833 06/29/11 1702  HGB -- 9.6*  HCT -- 29.4*  PLT -- 182  APTT -- --  LABPROT -- --  INR -- --  HEPARINUNFRC -- --  CREATININE -- 1.06  CKTOTAL -- --  CKMB -- --  TROPONINI <0.30 --   The CrCl is unknown because both a height and weight (above a minimum accepted value) are required for this calculation.  Medical History: Past Medical History  Diagnosis Date  . Esophageal reflux   . Other and unspecified hyperlipidemia   . Unspecified essential hypertension   . Type II or unspecified type diabetes mellitus without mention of complication, not stated as uncontrolled   . Macular degeneration (senile) of retina, unspecified   . Osteoporosis   . Anemia     Felt secondary renal insuffiency, seen by Dr. Arline Asp - Workup in the past has included normal iron, folate, B12, serum and urine protein electrophoresis as well as an ANA. Peripheral blood smear had been normal  . Pancytopenia 2010    Intermittent mild leukopenia and thrombocytopenia felt to be most likely due to immune dysregulation per Dr. Arline Asp  . Dyslipidemia   . Gout   . Carotid arterial disease     Last dopplers 12/2010 - RICA 40-59%, LICA 0-39% for recheck in 1 year    Medications:   (Not in a hospital admission)  Admit Complaint 78 yo F admitted with chest pain.  Pharmacy consulted to start dose heparin  Assessment: Anticoagulation: ACS: heparin initiated Cardiovascular: HTN, hyperlipidemia, carotid artery disease,  Endocrine: Diabetes  Goal of Therapy:  Heparin level 0.3-0.7 units/ml   Plan:  1. Heparin 3000 units x 1 then 700 units/hr 2. Heparin level  8h after ggt starts 3. Daily heparin level and CBC  Gabriele Loveland Merrily Brittle 06/29/2011,8:08 PM

## 2011-06-29 NOTE — ED Notes (Signed)
Doppler tech at bedside.  Pt denies any needs at this time.

## 2011-06-29 NOTE — ED Notes (Signed)
Meal tray given 

## 2011-06-29 NOTE — ED Notes (Signed)
Vascular tech paged and was made aware of request

## 2011-06-29 NOTE — ED Notes (Signed)
Daughter in [place] 

## 2011-06-29 NOTE — ED Notes (Signed)
MD at bedside. 

## 2011-06-29 NOTE — Progress Notes (Signed)
Left lower extremity venous duplex completed.  Preliminary report is negative for DVT, SVT, or a Baker's cyst in the left leg.  Negative for DVT in the right common femoral vein. 

## 2011-06-30 ENCOUNTER — Encounter (HOSPITAL_COMMUNITY)
Admission: EM | Disposition: A | Discharge status: Home / Self Care | Payer: Self-pay | Source: Home / Self Care | Attending: Cardiology

## 2011-06-30 ENCOUNTER — Other Ambulatory Visit: Payer: Self-pay

## 2011-06-30 DIAGNOSIS — I2510 Atherosclerotic heart disease of native coronary artery without angina pectoris: Secondary | ICD-10-CM

## 2011-06-30 DIAGNOSIS — I200 Unstable angina: Secondary | ICD-10-CM | POA: Diagnosis present

## 2011-06-30 DIAGNOSIS — R079 Chest pain, unspecified: Secondary | ICD-10-CM

## 2011-06-30 DIAGNOSIS — D649 Anemia, unspecified: Secondary | ICD-10-CM | POA: Diagnosis present

## 2011-06-30 HISTORY — PX: LEFT HEART CATHETERIZATION WITH CORONARY ANGIOGRAM: SHX5451

## 2011-06-30 LAB — HEPATIC FUNCTION PANEL
ALT: 12 U/L (ref 0–35)
AST: 27 U/L (ref 0–37)
Albumin: 3.1 g/dL — ABNORMAL LOW (ref 3.5–5.2)
Total Protein: 6.3 g/dL (ref 6.0–8.3)

## 2011-06-30 LAB — LIPID PANEL
Cholesterol: 118 mg/dL (ref 0–200)
HDL: 45 mg/dL (ref >39)
Triglycerides: 71 mg/dL (ref <150)

## 2011-06-30 LAB — HEPARIN LEVEL (UNFRACTIONATED): Heparin Unfractionated: 0.52 IU/mL (ref 0.30–0.70)

## 2011-06-30 LAB — PROTIME-INR: INR: 1.13 (ref 0.00–1.49)

## 2011-06-30 LAB — CBC
MCH: 27.9 pg (ref 26.0–34.0)
Platelets: 161 10*3/uL (ref 150–400)
RBC: 3.08 MIL/uL — ABNORMAL LOW (ref 3.87–5.11)

## 2011-06-30 LAB — CARDIAC PANEL(CRET KIN+CKTOT+MB+TROPI): Total CK: 92 U/L (ref 7–177)

## 2011-06-30 LAB — BASIC METABOLIC PANEL
CO2: 29 mEq/L (ref 19–32)
Calcium: 9.1 mg/dL (ref 8.4–10.5)
GFR calc non Af Amer: 44 mL/min — ABNORMAL LOW (ref >90)
Potassium: 4 mEq/L (ref 3.5–5.1)
Sodium: 143 mEq/L (ref 135–145)

## 2011-06-30 LAB — GLUCOSE, CAPILLARY
Glucose-Capillary: 71 mg/dL (ref 70–99)
Glucose-Capillary: 78 mg/dL (ref 70–99)

## 2011-06-30 SURGERY — LEFT HEART CATHETERIZATION WITH CORONARY ANGIOGRAM
Anesthesia: LOCAL

## 2011-06-30 SURGERY — LEFT HEART CATHETERIZATION WITH CORONARY ANGIOGRAM
Anesthesia: Moderate Sedation

## 2011-06-30 MED ORDER — ISOSORBIDE MONONITRATE ER 30 MG PO TB24
30.0000 mg | ORAL_TABLET | Freq: Every day | ORAL | Status: DC
  Administered 2011-06-30: 30 mg via ORAL
  Filled 2011-06-30 (×2): qty 1

## 2011-06-30 MED ORDER — MIDAZOLAM HCL 2 MG/2ML IJ SOLN
INTRAMUSCULAR | Status: AC
  Filled 2011-06-30: qty 2

## 2011-06-30 MED ORDER — HEPARIN (PORCINE) IN NACL 2-0.9 UNIT/ML-% IJ SOLN
INTRAMUSCULAR | Status: AC
  Filled 2011-06-30: qty 2000

## 2011-06-30 MED ORDER — SODIUM CHLORIDE 0.9 % IJ SOLN: 3.0000 mL | Freq: Two times a day (BID) | INTRAMUSCULAR | Status: DC

## 2011-06-30 MED ORDER — DIAZEPAM 5 MG PO TABS
5.0000 mg | ORAL_TABLET | ORAL | Status: DC
  Filled 2011-06-30: qty 1

## 2011-06-30 MED ORDER — SODIUM CHLORIDE 0.9 % IV SOLN: 250.0000 mL | INTRAVENOUS | Status: DC | PRN

## 2011-06-30 MED ORDER — LIDOCAINE HCL (PF) 1 % IJ SOLN
INTRAMUSCULAR | Status: AC
  Filled 2011-06-30: qty 30

## 2011-06-30 MED ORDER — NITROGLYCERIN 0.2 MG/ML ON CALL CATH LAB
INTRAVENOUS | Status: AC
  Filled 2011-06-30: qty 1

## 2011-06-30 MED ORDER — SODIUM CHLORIDE 0.9 % IV SOLN: 1.0000 mL/kg/h | INTRAVENOUS | Status: DC

## 2011-06-30 MED ORDER — HEPARIN SODIUM (PORCINE) 1000 UNIT/ML IJ SOLN
INTRAMUSCULAR | Status: AC
  Filled 2011-06-30: qty 1

## 2011-06-30 MED ORDER — SODIUM CHLORIDE 0.9 % IJ SOLN: 3.0000 mL | INTRAMUSCULAR | Status: DC | PRN

## 2011-06-30 MED ORDER — VERAPAMIL HCL 2.5 MG/ML IV SOLN
INTRAVENOUS | Status: AC
  Filled 2011-06-30: qty 2

## 2011-06-30 MED ORDER — SODIUM CHLORIDE 0.9 % IV SOLN
1.0000 mL/kg/h | INTRAVENOUS | Status: AC
  Administered 2011-06-30: 1 mL/kg/h via INTRAVENOUS

## 2011-06-30 NOTE — Interval H&P Note (Signed)
History and Physical Interval Note:  06/30/2011 11:42 AM  Patricia Moran  has presented today for surgery, with the diagnosis of unstable angina  The various methods of treatment have been discussed with the patient and family. After consideration of risks, benefits and other options for treatment, the patient has consented to  Procedure(s) (LRB): LEFT HEART CATHETERIZATION WITH CORONARY ANGIOGRAM (N/A) as a surgical intervention .  The patients' history has been reviewed, patient examined, no change in status, stable for surgery.  I have reviewed the patients' chart and labs.  Questions were answered to the patient's satisfaction.     Theron Arista [facility]

## 2011-06-30 NOTE — H&P (View-Only) (Signed)
 TELEMETRY: Reviewed telemetry pt in NSR: Filed Vitals:   06/29/11 2015 06/29/11 2038 06/29/11 2130 06/30/11 0410  BP:  148/58 171/52 137/57  Pulse: 74 77 58 77  Temp:  97.8 F (36.6 C) 97.5 F (36.4 C) 97.9 F (36.6 C)  TempSrc:  Axillary Oral Oral  Resp: 16 15 14 16  Height:   5' 1.5" (1.562 m)   Weight:   61.4 kg (135 lb 5.8 oz)   SpO2: 100% 98% 97% 99%    Intake/Output Summary (Last 24 hours) at 06/30/11 0756 Last data filed at 06/30/11 0636  Gross per 24 hour  Intake    279 ml  Output      0 ml  Net    279 ml    SUBJECTIVE Denies any chest pain or SOB. States she feels like a new woman compared to yesterday. No change in stools. No bleeding.  LABS: Basic Metabolic Panel:  Basename 06/29/11 1702  NA 139  K 4.2  CL 101  CO2 29  GLUCOSE 89  BUN 30*  CREATININE 1.06  CALCIUM 9.8  MG --  PHOS --   Liver Function Tests:  Basename 06/29/11 2328 06/29/11 1702  AST 27 28  ALT 12 11  ALKPHOS 60 69  BILITOT 0.2* 0.3  PROT 6.3 7.1  ALBUMIN 3.1* 3.4*   No results found for this basename: LIPASE:2,AMYLASE:2 in the last 72 hours CBC:  Basename 06/30/11 0702 06/29/11 1702  WBC 4.3 5.4  NEUTROABS -- 3.9  HGB 8.6* 9.6*  HCT 26.7* 29.4*  MCV 86.7 87.8  PLT 161 182   Cardiac Enzymes:  Basename 06/30/11 0146 06/29/11 2022 06/29/11 1833  CKTOTAL 92 88 --  CKMB 2.3 2.4 --  CKMBINDEX -- -- --  TROPONINI <0.30 <0.30 <0.30   BNP: No components found with this basename: POCBNP:3 D-Dimer:  Basename 06/29/11 1701  DDIMER 0.81*   Hemoglobin A1C: No results found for this basename: HGBA1C in the last 72 hours Fasting Lipid Panel: No results found for this basename: CHOL,HDL,LDLCALC,TRIG,CHOLHDL,LDLDIRECT in the last 72 hours Thyroid Function Tests: No results found for this basename: TSH,T4TOTAL,FREET3,T3FREE,THYROIDAB in the last 72 hours Anemia Panel: No results found for this basename: VITAMINB12,FOLATE,FERRITIN,TIBC,IRON,RETICCTPCT in the last 72  hours  Radiology/Studies:  Ct Angio Chest W/cm &/or Wo Cm  06/29/2011  *RADIOLOGY REPORT*  Clinical Data: Chest pain, shortness of breath and palpitations.  CT ANGIOGRAPHY CHEST  Technique:  Multidetector CT imaging of the chest using the standard protocol during bolus administration of intravenous contrast. Multiplanar reconstructed images including MIPs were obtained and reviewed to evaluate the vascular anatomy.  Contrast: 80mL OMNIPAQUE IOHEXOL 300 MG/ML IJ SOLN  Comparison: None.  Findings: The pulmonary arteries are adequately opacified.  There is no evidence of pulmonary embolism.  Heavily calcified atherosclerotic plaque is noted in the distribution of the LAD and also potentially in the left circumflex coronary artery.  The right coronary artery is not well visualized due to motion.  However, there does appear to be potentially calcified plaque in the RCA. Atherosclerosis also noted at the level of the aortic arch and origin of the left subclavian artery.  The thoracic aorta is under opacified but does not appear aneurysmal.  Lungs show no evidence of infiltrate or edema.  There is atelectasis and scarring in both lung bases.  No pleural or pericardial fluid identified.  The heart is felt to be mildly enlarged.  No nodules or enlarged lymph nodes.  Upper abdominal aorta demonstrates atherosclerotic disease   with calcified plaque noted at the origins of both the celiac axis and superior mesenteric artery. Degenerative changes are present in the spine.  IMPRESSION:  1.  No evidence of acute pulmonary embolism. 2.  Coronary atherosclerosis with probable three-vessel distribution of calcified plaque.  This is most prominent by CT at the level of the LAD.  Original Report Authenticated By: GLENN T. YAMAGATA, M.D.   Dg Chest Portable 1 View  06/29/2011  *RADIOLOGY REPORT*  Clinical Data: Chest pain, shortness of breath  PORTABLE CHEST - 1 VIEW  Comparison: 03/08/2010  Findings: Mild cardiomegaly again  noted.  No acute infiltrate or pleural effusion.  No pulmonary edema.  Bony thorax is stable.  IMPRESSION: Mild cardiomegaly is stable.  No acute infiltrate or pleural effusion.  Original Report Authenticated By: LIVIU POP, M.D.   Mm Digital Screening  ECG shows T wave flattening in anterior leads.   PHYSICAL EXAM General: Eldeerly female, well nourished, in no acute distress. Head: Normocephalic, atraumatic, sclera non-icteric, no xanthomas, nares are without discharge. Neck: Negative for carotid bruits. JVD not elevated. Lungs: Clear bilaterally to auscultation without wheezes, rales, or rhonchi. Breathing is unlabored. Heart: RRR S1 S2 with soft systolic murmur LSB.  Abdomen: Soft, non-tender, non-distended with normoactive bowel sounds. No hepatomegaly. No rebound/guarding. No obvious abdominal masses. Msk:  Strength and tone appears normal for age. Extremities: No clubbing, cyanosis or edema.  Distal pedal pulses are 2+ and equal bilaterally. Neuro: Alert and oriented X 3. Moves all extremities spontaneously. Psych:  Responds to questions appropriately with a normal affect.  ASSESSMENT AND PLAN: 1. Chest pain consistent with unstable angina. She has ruled out for MI.  2. Anemia. Followed by Dr. Murinson. Baseline Hgb 10.2. Has been stable since 2009. Felt to be related to chronic renal disease. Now Hgb 8.6. Possible hemodiution. No clear bleeding. 3. Dyslipidemia 4. GERD 5. Carotid arterial disease 6. HTN  Plan: will proceed with cardiac cath today. Procedure and risks reviewed in detail with patient and she is agreeable to proceed. Will monitor Hgb closely. Heme check stools.   Principal Problem:  *Unstable angina Active Problems:  DIABETES MELLITUS, TYPE II  HYPERLIPIDEMIA  MITRAL REGURGITATION  HYPERTENSION  CAROTID STENOSIS  Anemia    Signed, Millee Denise MD, FACC 06/30/2011 7:56 AM    

## 2011-06-30 NOTE — Progress Notes (Signed)
ANTICOAGULATION CONSULT NOTE - follow up  Pharmacy Consult for Heparin Indication: chest pain/ACS  Patient Measurements:  132 lbs and 61ft 1.5 in Height: 5' 1.5" (156.2 cm) Weight: 135 lb 5.8 oz (61.4 kg) IBW/kg (Calculated) : 48.95  Heparin Dosing Weight: 62 kg  Vital Signs: Temp: 97.9 F (36.6 C) (03/13 0410) Temp src: Oral (03/13 0410) BP: 137/57 mmHg (03/13 0410) Pulse Rate: 77  (03/13 0410)  Labs:  Patricia Moran 06/30/11 0702 06/30/11 0146 06/29/11 2328 06/29/11 2022 06/29/11 1833 06/29/11 1702  HGB 8.6* -- -- -- -- 9.6*  HCT 26.7* -- -- -- -- 29.4*  PLT 161 -- -- -- -- 182  APTT -- -- -- 33 -- --  LABPROT -- -- 14.7 -- -- --  INR -- -- 1.13 -- -- --  HEPARINUNFRC 0.52 -- -- -- -- --  CREATININE 1.15* -- -- -- -- 1.06  CKTOTAL -- 92 -- 88 -- --  CKMB -- 2.3 -- 2.4 -- --  TROPONINI -- <0.30 -- <0.30 <0.30 --   Estimated Creatinine Clearance: 33.8 ml/min (by C-G formula based on Cr of 1.15).    Admit Complaint 78 yo F admitted with chest pain.  MI ruled out. To have cardiac cath today. Hg 8.6 from baseline 10.2.  Heparin level = 0.52 after 3000 unit bolus and 700 units/hr = therapeutic. No bleeding reported.  Goal of Therapy:  Heparin level 0.3-0.7 units/ml   Plan:  1  Continue heparin at 700 units/hr 2. F/u after cath for plans 3. Daily heparin level and CBC while on heparin  [name] T 06/30/2011,9:55 AM

## 2011-06-30 NOTE — Progress Notes (Signed)
UR Completed. Simmons, Zephaniah Enyeart F [phone]  

## 2011-06-30 NOTE — CV Procedure (Signed)
   Cardiac Catheterization Procedure Note  Name: Patricia Moran MRN: [id number] DOB: 09-05-1929  Procedure: Left Heart Cath, Selective Coronary Angiography, LV angiography  Indication: 78 year old white female who presents with symptoms of increasing chest pain on exertion and at rest. Prior CT of the chest showed diffuse coronary calcification.   Procedural Details: The right wrist was prepped, draped, and anesthetized with 1% lidocaine. Using the modified Seldinger technique, a 5 French sheath was introduced into the right radial artery. 3 mg of verapamil was administered through the sheath, weight-based unfractionated heparin was administered intravenously. Standard Judkins catheters were used for selective coronary angiography and left ventriculography. Catheter exchanges were performed over an exchange length guidewire. There were no immediate procedural complications. A TR band was used for radial hemostasis at the completion of the procedure.  The patient was transferred to the post catheterization recovery area for further monitoring.  Procedural Findings: Hemodynamics: AO 118/43 with a mean of 70 mm of mercury LV 120/14 mmHg  Coronary angiography: Coronary dominance: right  Left mainstem: Normal.  Left anterior descending (LAD): The LAD is moderately calcified in the proximal mid vessel. There is diffuse atherosclerosis involving the proximal and mid LAD in segmental fashion. There is diffuse 40-50% disease.  Left circumflex (LCx): The left circumflex demonstrates only mild wall irregularities less than 10%.  Right coronary artery (RCA): The right coronary is a dominant vessel. In the mid vessel there is a focal 50-60% stenosis. The right coronary is also moderately calcified.  Left ventriculography: Left ventricular systolic function is normal, LVEF is estimated at 55-65%, there is no significant mitral regurgitation   Final Conclusions:   1. Moderate, diffuse, nonobstructive  atherosclerotic coronary disease. 2. Normal left ventricular function.  Recommendations: Recommend medical therapy with risk factor modification.  [name] 06/30/2011, 12:30 PM

## 2011-06-30 NOTE — Progress Notes (Signed)
Hypoglycemic note:  Pt's CBG was 68 this evening.  She drank some OJ and came up to 78.  Also, gave her a bedtime snack.  Will continue to monitor. Arva Chafe

## 2011-06-30 NOTE — Progress Notes (Signed)
TELEMETRY: Reviewed telemetry pt in NSR: Filed Vitals:   06/29/11 2015 06/29/11 2038 06/29/11 2130 06/30/11 0410  BP:  148/58 171/52 137/57  Pulse: 74 77 58 77  Temp:  97.8 F (36.6 C) 97.5 F (36.4 C) 97.9 F (36.6 C)  TempSrc:  Axillary Oral Oral  Resp: 16 15 14 16   Height:   5' 1.5" (1.562 m)   Weight:   61.4 kg (135 lb 5.8 oz)   SpO2: 100% 98% 97% 99%    Intake/Output Summary (Last 24 hours) at 06/30/11 0756 Last data filed at 06/30/11 0636  Gross per 24 hour  Intake    279 ml  Output      0 ml  Net    279 ml    SUBJECTIVE Denies any chest pain or SOB. States she feels like a new woman compared to yesterday. No change in stools. No bleeding.  LABS: Basic Metabolic Panel:  Basename 06/29/11 1702  NA 139  K 4.2  CL 101  CO2 29  GLUCOSE 89  BUN 30*  CREATININE 1.06  CALCIUM 9.8  MG --  PHOS --   Liver Function Tests:  [facility] 06/29/11 2328 06/29/11 1702  AST 27 28  ALT 12 11  ALKPHOS 60 69  BILITOT 0.2* 0.3  PROT 6.3 7.1  ALBUMIN 3.1* 3.4*   No results found for this basename: LIPASE:2,AMYLASE:2 in the last 72 hours CBC:  Basename 06/30/11 0702 06/29/11 1702  WBC 4.3 5.4  NEUTROABS -- 3.9  HGB 8.6* 9.6*  HCT 26.7* 29.4*  MCV 86.7 87.8  PLT 161 182   Cardiac Enzymes:  Basename 06/30/11 0146 06/29/11 2022 06/29/11 1833  CKTOTAL 92 88 --  CKMB 2.3 2.4 --  CKMBINDEX -- -- --  TROPONINI <0.30 <0.30 <0.30   BNP: No components found with this basename: POCBNP:3 D-Dimer:  Basename 06/29/11 1701  DDIMER 0.81*   Hemoglobin A1C: No results found for this basename: HGBA1C in the last 72 hours Fasting Lipid Panel: No results found for this basename: CHOL,HDL,LDLCALC,TRIG,CHOLHDL,LDLDIRECT in the last 72 hours Thyroid Function Tests: No results found for this basename: TSH,T4TOTAL,FREET3,T3FREE,THYROIDAB in the last 72 hours Anemia Panel: No results found for this basename: VITAMINB12,FOLATE,FERRITIN,TIBC,IRON,RETICCTPCT in the last 72  hours  Radiology/Studies:  Ct Angio Chest W/cm &/or Wo Cm  06/29/2011  *RADIOLOGY REPORT*  Clinical Data: Chest pain, shortness of breath and palpitations.  CT ANGIOGRAPHY CHEST  Technique:  Multidetector CT imaging of the chest using the standard protocol during bolus administration of intravenous contrast. Multiplanar reconstructed images including MIPs were obtained and reviewed to evaluate the vascular anatomy.  Contrast: 80mL OMNIPAQUE IOHEXOL 300 MG/ML IJ SOLN  Comparison: None.  Findings: The pulmonary arteries are adequately opacified.  There is no evidence of pulmonary embolism.  Heavily calcified atherosclerotic plaque is noted in the distribution of the LAD and also potentially in the left circumflex coronary artery.  The right coronary artery is not well visualized due to motion.  However, there does appear to be potentially calcified plaque in the RCA. Atherosclerosis also noted at the level of the aortic arch and origin of the left subclavian artery.  The thoracic aorta is under opacified but does not appear aneurysmal.  Lungs show no evidence of infiltrate or edema.  There is atelectasis and scarring in both lung bases.  No pleural or pericardial fluid identified.  The heart is felt to be mildly enlarged.  No nodules or enlarged lymph nodes.  Upper abdominal aorta demonstrates atherosclerotic disease  with calcified plaque noted at the origins of both the celiac axis and superior mesenteric artery. Degenerative changes are present in the spine.  IMPRESSION:  1.  No evidence of acute pulmonary embolism. 2.  Coronary atherosclerosis with probable three-vessel distribution of calcified plaque.  This is most prominent by CT at the level of the LAD.  Original Report Authenticated By: Reola Calkins, M.D.   Dg Chest Portable 1 View  06/29/2011  *RADIOLOGY REPORT*  Clinical Data: Chest pain, shortness of breath  PORTABLE CHEST - 1 VIEW  Comparison: 03/08/2010  Findings: Mild cardiomegaly again  noted.  No acute infiltrate or pleural effusion.  No pulmonary edema.  Bony thorax is stable.  IMPRESSION: Mild cardiomegaly is stable.  No acute infiltrate or pleural effusion.  Original Report Authenticated By: Natasha Mead, M.D.   Mm Digital Screening  ECG shows T wave flattening in anterior leads.   PHYSICAL EXAM General: Eldeerly female, well nourished, in no acute distress. Head: Normocephalic, atraumatic, sclera non-icteric, no xanthomas, nares are without discharge. Neck: Negative for carotid bruits. JVD not elevated. Lungs: Clear bilaterally to auscultation without wheezes, rales, or rhonchi. Breathing is unlabored. Heart: RRR S1 S2 with soft systolic murmur LSB.  Abdomen: Soft, non-tender, non-distended with normoactive bowel sounds. No hepatomegaly. No rebound/guarding. No obvious abdominal masses. Msk:  Strength and tone appears normal for age. Extremities: No clubbing, cyanosis or edema.  Distal pedal pulses are 2+ and equal bilaterally. Neuro: Alert and oriented X 3. Moves all extremities spontaneously. Psych:  Responds to questions appropriately with a normal affect.  ASSESSMENT AND PLAN: 1. Chest pain consistent with unstable angina. She has ruled out for MI.  2. Anemia. Followed by Dr. Arline Asp. Baseline Hgb 10.2. Has been stable since 2009. Felt to be related to chronic renal disease. Now Hgb 8.6. Possible hemodiution. No clear bleeding. 3. Dyslipidemia 4. GERD 5. Carotid arterial disease 6. HTN  Plan: will proceed with cardiac cath today. Procedure and risks reviewed in detail with patient and she is agreeable to proceed. Will monitor Hgb closely. Heme check stools.   Principal Problem:  *Unstable angina Active Problems:  DIABETES MELLITUS, TYPE II  HYPERLIPIDEMIA  MITRAL REGURGITATION  HYPERTENSION  CAROTID STENOSIS  Anemia    Signed, Mirella Gueye [place] MD, [facility] 06/30/2011 7:56 AM

## 2011-07-01 ENCOUNTER — Encounter (HOSPITAL_COMMUNITY): Payer: Self-pay | Admitting: General Practice

## 2011-07-01 DIAGNOSIS — R079 Chest pain, unspecified: Secondary | ICD-10-CM

## 2011-07-01 LAB — GLUCOSE, CAPILLARY
Glucose-Capillary: 88 mg/dL (ref 70–99)
Glucose-Capillary: 93 mg/dL (ref 70–99)

## 2011-07-01 LAB — CBC
Platelets: 163 10*3/uL (ref 150–400)
RDW: 15.3 % (ref 11.5–15.5)
WBC: 4.5 10*3/uL (ref 4.0–10.5)

## 2011-07-01 MED ORDER — MELOXICAM 7.5 MG PO TABS: 7.5000 mg | ORAL_TABLET | Freq: Every day | ORAL | Status: DC | PRN

## 2011-07-01 MED ORDER — BISOPROLOL FUMARATE 5 MG PO TABS: 2.5000 mg | ORAL_TABLET | Freq: Two times a day (BID) | ORAL | Status: DC

## 2011-07-01 MED ORDER — PIOGLITAZONE HCL 15 MG PO TABS: 30.0000 mg | ORAL_TABLET | Freq: Every day | ORAL | Status: AC

## 2011-07-01 NOTE — Progress Notes (Signed)
TELEMETRY: Reviewed telemetry pt in NSR: Filed Vitals:   06/30/11 1016 06/30/11 1142 06/30/11 2028 07/01/11 0745  BP: 144/55  120/67 109/65  Pulse: 64 83 60 67  Temp: 97.9 F (36.6 C)  98 F (36.7 C) 97.6 F (36.4 C)  TempSrc: Oral  Oral Oral  Resp: 18  16 16   Height:      Weight:      SpO2: 100%  99% 97%    Intake/Output Summary (Last 24 hours) at 07/01/11 0827 Last data filed at 06/30/11 2315  Gross per 24 hour  Intake    244 ml  Output    500 ml  Net   -256 ml    SUBJECTIVE Denies any chest pain or SOB.  Feels very well.  LABS: Basic Metabolic Panel:  Basename 06/30/11 0702 06/29/11 1702  NA 143 139  K 4.0 4.2  CL 106 101  CO2 29 29  GLUCOSE 82 89  BUN 26* 30*  CREATININE 1.15* 1.06  CALCIUM 9.1 9.8  MG -- --  PHOS -- --   Liver Function Tests:  [facility] 06/29/11 2328 06/29/11 1702  AST 27 28  ALT 12 11  ALKPHOS 60 69  BILITOT 0.2* 0.3  PROT 6.3 7.1  ALBUMIN 3.1* 3.4*   No results found for this basename: LIPASE:2,AMYLASE:2 in the last 72 hours CBC:  Basename 06/30/11 0702 06/29/11 1702  WBC 4.3 5.4  NEUTROABS -- 3.9  HGB 8.6* 9.6*  HCT 26.7* 29.4*  MCV 86.7 87.8  PLT 161 182   Cardiac Enzymes:  Basename 06/30/11 0146 06/29/11 2022 06/29/11 1833  CKTOTAL 92 88 --  CKMB 2.3 2.4 --  CKMBINDEX -- -- --  TROPONINI <0.30 <0.30 <0.30   BNP: No components found with this basename: POCBNP:3 D-Dimer:  Basename 06/29/11 1701  DDIMER 0.81*   Hemoglobin A1C: No results found for this basename: HGBA1C in the last 72 hours Fasting Lipid Panel:  Basename 06/30/11 0702  CHOL 118  HDL 45  LDLCALC 59  TRIG 71  CHOLHDL 2.6  LDLDIRECT --   Thyroid Function Tests: No results found for this basename: TSH,T4TOTAL,FREET3,T3FREE,THYROIDAB in the last 72 hours Anemia Panel: No results found for this basename: VITAMINB12,FOLATE,FERRITIN,TIBC,IRON,RETICCTPCT in the last 72 hours  Radiology/Studies:  Ct Angio Chest W/cm &/or Wo  Cm  06/29/2011  *RADIOLOGY REPORT*  Clinical Data: Chest pain, shortness of breath and palpitations.  CT ANGIOGRAPHY CHEST  Technique:  Multidetector CT imaging of the chest using the standard protocol during bolus administration of intravenous contrast. Multiplanar reconstructed images including MIPs were obtained and reviewed to evaluate the vascular anatomy.  Contrast: 80mL OMNIPAQUE IOHEXOL 300 MG/ML IJ SOLN  Comparison: None.  Findings: The pulmonary arteries are adequately opacified.  There is no evidence of pulmonary embolism.  Heavily calcified atherosclerotic plaque is noted in the distribution of the LAD and also potentially in the left circumflex coronary artery.  The right coronary artery is not well visualized due to motion.  However, there does appear to be potentially calcified plaque in the RCA. Atherosclerosis also noted at the level of the aortic arch and origin of the left subclavian artery.  The thoracic aorta is under opacified but does not appear aneurysmal.  Lungs show no evidence of infiltrate or edema.  There is atelectasis and scarring in both lung bases.  No pleural or pericardial fluid identified.  The heart is felt to be mildly enlarged.  No nodules or enlarged lymph nodes.  Upper abdominal aorta demonstrates atherosclerotic  disease with calcified plaque noted at the origins of both the celiac axis and superior mesenteric artery. Degenerative changes are present in the spine.  IMPRESSION:  1.  No evidence of acute pulmonary embolism. 2.  Coronary atherosclerosis with probable three-vessel distribution of calcified plaque.  This is most prominent by CT at the level of the LAD.  Original Report Authenticated By: Reola Calkins, M.D.   Dg Chest Portable 1 View  06/29/2011  *RADIOLOGY REPORT*  Clinical Data: Chest pain, shortness of breath  PORTABLE CHEST - 1 VIEW  Comparison: 03/08/2010  Findings: Mild cardiomegaly again noted.  No acute infiltrate or pleural effusion.  No pulmonary  edema.  Bony thorax is stable.  IMPRESSION: Mild cardiomegaly is stable.  No acute infiltrate or pleural effusion.  Original Report Authenticated By: Natasha Mead, M.D.   Mm Digital Screening  ECG shows T wave flattening in anterior leads.   PHYSICAL EXAM General: Eldeerly female, well nourished, in no acute distress. Head: Normocephalic, atraumatic, sclera non-icteric, no xanthomas, nares are without discharge. Neck: Negative for carotid bruits. JVD not elevated. Lungs: Clear bilaterally to auscultation without wheezes, rales, or rhonchi. Breathing is unlabored. Heart: RRR S1 S2 with soft systolic murmur LSB.  Abdomen: Soft, non-tender, non-distended with normoactive bowel sounds. No hepatomegaly. No rebound/guarding. No obvious abdominal masses. Msk:  Strength and tone appears normal for age. Extremities: No clubbing, cyanosis or edema.  Distal pedal pulses are 2+ and equal bilaterally. Radial site looks good without hematoma. Neuro: Alert and oriented X 3. Moves all extremities spontaneously. Psych:  Responds to questions appropriately with a normal affect.  ASSESSMENT AND PLAN: 1. Chest pain resolved. She has ruled out for MI. Moderate nonobstructive disease by cath 2. Anemia. Followed by Dr. Arline Asp. Baseline Hgb 10.2. Has been stable since 2009. Felt to be related to chronic renal disease. Will repeat this am. 3. Dyslipidemia 4. GERD 5. Carotid arterial disease 6. HTN  Plan: Will check Hgb today, if stable plan on discharge with the addition of low dose imdur.    Principal Problem:  *Unstable angina Active Problems:  DIABETES MELLITUS, TYPE II  HYPERLIPIDEMIA  MITRAL REGURGITATION  HYPERTENSION  CAROTID STENOSIS  Anemia    Signed, Rhiana Morash [place] MD, [facility] 07/01/2011 8:27 AM

## 2011-07-01 NOTE — Discharge Summary (Signed)
Patient seen and examined and history reviewed. Agree with above findings and plan. Hgb stable at 8.8. History of intolerance to nitrates in past so will hold for now. Otherwise see prior note.  Thedora Hinders 07/01/2011 12:42 PM

## 2011-07-01 NOTE — ED Provider Notes (Signed)
History/physical exam/procedure(s) were performed by non-physician practitioner and as supervising physician I was immediately available for consultation/collaboration. I have reviewed all notes and am in agreement with care and plan. Patient seen and examined and agree with plan.  Hilario Quarry, MD 07/01/11 1308

## 2011-07-01 NOTE — Discharge Summary (Signed)
Discharge Summary   Patient ID: YARED SUSAN MRN: [id number], DOB/AGE: 76-Dec-1931 76 y.o. Admit date: 06/29/2011 D/C date:     07/01/2011   Primary Discharge Diagnoses:  1. Chest pain with moderate nonobstructive CAD by cath - low dose nitrates added - ruled out for MI, no PE on CTA 2. Anemia, previously followed by Dr. Arline Asp (recently set free 05/2011 - no clear etiology with workup in past) 3. Renal insufficiency (CrCl ~45)  Secondary Discharge Diagnoses:  1. Esophageal reflux 2. HTN 3. HL 4. DM 5. Macular degeneration 6. Osteoporosis 7. Pancytopenia (intermittent mild leukopenia and thrombocytopenia felt to be most likely due to immune dysregulation per Dr. Arline Asp) 8. Dyslipidemia 9. Gout 10. Carotid arterial disease - Last dopplers 12/2010 - RICA 40-59%, LICA 0-39% for recheck in 1 year  Hospital Course: 78 y/o F with hx of HTN, HL, DM, prior non-ischemic myoview 06/2009, mild MR/milld AI presented to [facility] with complaints of chest pain. It started while paying bills at rest, radiating to right shoulder, through her back. She took ASA without relief and ultimately called 911. She denied any CP with inspiration. LE dopplers were negative in ER for DVT. POC troponins were negative. . CT angio showed no PE but did show coronary atherosclerosis with probable three-vessel distribution of calcified plaque, most prominent by CT at the level of the LAD. EKG showed anterior TWI and she was pain free by time of evaluation by cardiology in the ER. Her pain was felt worrisome for unstable angina given TWI and CTA findings. She was started on heparin per pharmacy. She underwent left heart cath yesterday demonstrating moderate diffuse nonobstructive CAD with normal LV function and ultimately med rx was recommended. Low dose nitrates were going to be added but the patient has a hx of syncope on two accounts from NTG in the remote past, so this was held per discussion with Dr. [place].  Close monitoring of her anemia was kept & Hgb did decrease slightly although this was felt possibly secondary to hemodilution. A recheck this morning confirmed it was stable. FOBT was negative. Given her mild renal insufficiency, she will have a BMET as an outpatient to ensure stability of her Cr per Dr. [place], who has seen and examined her and feels she is stable for discharge.  Discharge Vitals: Blood pressure 109/65, pulse 67, temperature 97.6 F (36.4 C), temperature source Oral, resp. rate 16, height 5' 1.5" (1.562 m), weight 135 lb 5.8 oz (61.4 kg), SpO2 97.00%.  Labs: Lab Results  Component Value Date   WBC 4.5 07/01/2011   HGB 8.8* 07/01/2011   HCT 26.9* 07/01/2011   MCV 87.1 07/01/2011   PLT 163 07/01/2011     Lab 06/30/11 0702 06/29/11 2328  NA 143 --  K 4.0 --  CL 106 --  CO2 29 --  BUN 26* --  CREATININE 1.15* --  CALCIUM 9.1 --  PROT -- 6.3  BILITOT -- 0.2*  ALKPHOS -- 60  ALT -- 12  AST -- 27  GLUCOSE 82 --    Basename 06/30/11 0146 06/29/11 2022 06/29/11 1833  CKTOTAL 92 88 --  CKMB 2.3 2.4 --  TROPONINI <0.30 <0.30 <0.30   Lab Results  Component Value Date   CHOL 118 06/30/2011   HDL 45 06/30/2011   LDLCALC 59 06/30/2011   TRIG 71 06/30/2011   Lab Results  Component Value Date   DDIMER 0.81* 06/29/2011    Diagnostic Studies/Procedures   1. Ct Angio  Chest W/cm &/or Wo Cm 06/29/2011  *RADIOLOGY REPORT*  Clinical Data: Chest pain, shortness of breath and palpitations.  CT ANGIOGRAPHY CHEST  Technique:  Multidetector CT imaging of the chest using the standard protocol during bolus administration of intravenous contrast. Multiplanar reconstructed images including MIPs were obtained and reviewed to evaluate the vascular anatomy.  Contrast: 80mL OMNIPAQUE IOHEXOL 300 MG/ML IJ SOLN  Comparison: None.  Findings: The pulmonary arteries are adequately opacified.  There is no evidence of pulmonary embolism.  Heavily calcified atherosclerotic plaque is noted in the  distribution of the LAD and also potentially in the left circumflex coronary artery.  The right coronary artery is not well visualized due to motion.  However, there does appear to be potentially calcified plaque in the RCA. Atherosclerosis also noted at the level of the aortic arch and origin of the left subclavian artery.  The thoracic aorta is under opacified but does not appear aneurysmal.  Lungs show no evidence of infiltrate or edema.  There is atelectasis and scarring in both lung bases.  No pleural or pericardial fluid identified.  The heart is felt to be mildly enlarged.  No nodules or enlarged lymph nodes.  Upper abdominal aorta demonstrates atherosclerotic disease with calcified plaque noted at the origins of both the celiac axis and superior mesenteric artery. Degenerative changes are present in the spine.  IMPRESSION:  1.  No evidence of acute pulmonary embolism. 2.  Coronary atherosclerosis with probable three-vessel distribution of calcified plaque.  This is most prominent by CT at the level of the LAD.  Original Report Authenticated By: Reola Calkins, M.D.   2. Chest Portable 1 View 06/29/2011  *RADIOLOGY REPORT*  Clinical Data: Chest pain, shortness of breath  PORTABLE CHEST - 1 VIEW  Comparison: 03/08/2010  Findings: Mild cardiomegaly again noted.  No acute infiltrate or pleural effusion.  No pulmonary edema.  Bony thorax is stable.  IMPRESSION: Mild cardiomegaly is stable.  No acute infiltrate or pleural effusion.  Original Report Authenticated By: Natasha Mead, M.D.   3. Mm Digital Screening 06/17/2011  DG SCREEN MAMMOGRAM BILATERAL Bilateral CC and MLO view(s) were taken.  DIGITAL SCREENING MAMMOGRAM WITH CAD: There are scattered fibroglandular densities.  No masses or malignant type calcifications are  identified.  Compared with prior studies.  Images were processed with CAD.  IMPRESSION: No specific mammographic evidence of malignancy.  Next screening mammogram is recommended in one  year.   A result letter of this screening mammogram will be mailed directly to the patient.  ASSESSMENT: Negative - BI-RADS 1  Screening mammogram in 1 year. ,    Discharge Medications   Medication List  As of 07/01/2011 12:31 PM   TAKE these medications         allopurinol 100 MG tablet   Commonly known as: ZYLOPRIM   Take 100 mg by mouth daily.      aspirin 81 MG tablet   Take 81 mg by mouth daily.      AVASTIN IV   Inject into the vein. Injection in left eye. Every 14 weeks.        bisoprolol 5 MG tablet   Commonly known as: ZEBETA   Take 0.5 tablets (2.5 mg total) by mouth 2 (two) times daily.      CALTRATE 600 PO   Take 1 tablet by mouth daily.      CENTRUM SILVER ULTRA MENS Tabs   Take by mouth daily.      Ocuvite PreserVision Tabs  Take by mouth daily.      Cinnamon 500 MG capsule   Take 1,000 mg by mouth 2 (two) times daily.      diazepam 5 MG tablet   Commonly known as: VALIUM   Take 5 mg by mouth as needed.      Geritol Complete Tabs   Take by mouth daily.      lisinopril 10 MG tablet   Commonly known as: PRINIVIL,ZESTRIL   Take 10 mg by mouth daily.      meloxicam 7.5 MG tablet   Commonly known as: MOBIC   Take 1 tablet (7.5 mg total) by mouth daily as needed. HOWEVER - please talk to your primary doctor about this medicine. Your labs indicate you have mild kidney insufficiency, and this medicine is typically avoided in patients with this problem.      pioglitazone 15 MG tablet   Commonly known as: ACTOS   Take 2 tablets (30 mg total) by mouth daily. SEE COMMENT      simvastatin 20 MG tablet   Commonly known as: ZOCOR   Take 20 mg by mouth at bedtime.      SYSTANE 0.4-0.3 % Soln   Generic drug: Polyethyl Glycol-Propyl Glycol   Apply to eye as directed.      VIGAMOX 0.5 % ophthalmic solution   Generic drug: moxifloxacin   1 drop as directed. Takes with injection in the eye      VITAMIN D MAINTENANCE PO   Take 1 tablet by mouth daily.              Disposition   The patient will be discharged in stable condition to home. Discharge Orders    Future Appointments: Provider: Department: Dept Phone: Center:   07/06/2011 2:00 PM Beatrice Lecher, PA [facility] [place] [phone] [facility]   07/06/2011 2:30 PM [facility] [place] [phone] [facility]   07/29/2011 7:30 AM Sherrie George, MD [facility] [phone] None     Future Orders Please Complete By Expires   Diet - low sodium heart healthy      Increase activity slowly      Comments:   No driving for 2 days. No lifting over 5 lbs for 1 week. No sexual activity for 1 week. Keep procedure site clean & dry. If you notice increased pain, swelling, bleeding or pus, call/return!  You may shower, but no soaking baths/hot tubs/pools for 1 week.       Follow-up Information    Follow up with Primary Care Doctor. (For continued periodic monitoring of your anemia and other general health maintenance)       Follow up with Tereso Newcomer, PA. (07/06/11 at 2pm. You will also have labs that day.)    Contact information:   [address] [place] [address] [phone]            Duration of Discharge Encounter: 40 minutes including physician and PA time.  Signed, Ronie Spies PA-C 07/01/2011, 12:31 PM

## 2011-07-02 ENCOUNTER — Telehealth: Payer: Self-pay | Admitting: Pharmacist

## 2011-07-02 NOTE — Telephone Encounter (Signed)
Spoke with pt.  She is doing well since hospital discharge.  No CP.  She is aware of her medication changes and her upcoming appts.  She cleaned her cath site today and states it looks good and has no signs of infection or bruising.

## 2011-07-05 ENCOUNTER — Encounter: Payer: Self-pay | Admitting: *Deleted

## 2011-07-06 ENCOUNTER — Other Ambulatory Visit: Payer: Medicare HMO

## 2011-07-06 ENCOUNTER — Telehealth: Payer: Self-pay | Admitting: *Deleted

## 2011-07-06 ENCOUNTER — Encounter: Payer: Self-pay | Admitting: Physician Assistant

## 2011-07-06 ENCOUNTER — Ambulatory Visit (INDEPENDENT_AMBULATORY_CARE_PROVIDER_SITE_OTHER): Payer: Medicare HMO | Admitting: Physician Assistant

## 2011-07-06 VITALS — BP 150/80 | HR 50 | Ht 61.5 in | Wt 132.0 lb

## 2011-07-06 DIAGNOSIS — I080 Rheumatic disorders of both mitral and aortic valves: Secondary | ICD-10-CM

## 2011-07-06 DIAGNOSIS — I6529 Occlusion and stenosis of unspecified carotid artery: Secondary | ICD-10-CM

## 2011-07-06 DIAGNOSIS — I10 Essential (primary) hypertension: Secondary | ICD-10-CM

## 2011-07-06 DIAGNOSIS — D649 Anemia, unspecified: Secondary | ICD-10-CM

## 2011-07-06 DIAGNOSIS — I2510 Atherosclerotic heart disease of native coronary artery without angina pectoris: Secondary | ICD-10-CM

## 2011-07-06 LAB — CBC WITH DIFFERENTIAL/PLATELET
Eosinophils Relative: 2.8 % (ref 0.0–5.0)
HCT: 28.3 % — ABNORMAL LOW (ref 36.0–46.0)
Hemoglobin: 9.4 g/dL — ABNORMAL LOW (ref 12.0–15.0)
Lymphocytes Relative: 39.8 % (ref 12.0–46.0)
Lymphs Abs: 1.4 10*3/uL (ref 0.7–4.0)
Monocytes Relative: 9.5 % (ref 3.0–12.0)
Neutro Abs: 1.7 10*3/uL (ref 1.4–7.7)
Platelets: 143 10*3/uL — ABNORMAL LOW (ref 150.0–400.0)
WBC: 3.5 10*3/uL — ABNORMAL LOW (ref 4.5–10.5)

## 2011-07-06 LAB — BASIC METABOLIC PANEL
Calcium: 9.8 mg/dL (ref 8.4–10.5)
GFR: 49.29 mL/min — ABNORMAL LOW (ref >60.00)
Potassium: 4.6 mEq/L (ref 3.5–5.1)
Sodium: 140 mEq/L (ref 135–145)

## 2011-07-06 MED ORDER — BISOPROLOL FUMARATE 5 MG PO TABS: 2.5000 mg | ORAL_TABLET | Freq: Two times a day (BID) | ORAL | Status: DC

## 2011-07-06 NOTE — Telephone Encounter (Signed)
wcb tomorrow, line was busy x 2. Danielle Rankin

## 2011-07-06 NOTE — Progress Notes (Signed)
[address] [place], [place]  [address] Phone: [phone] Fax:  [phone]  Date:  07/06/2011   Name:  Patricia Moran       DOB:  04/03/1930 MRN:  [id number]  PCP:  Dr. Selena Batten Primary Cardiologist:  Dr. Olga Millers  Primary Electrophysiologist:  None    History of Present Illness: Patricia Moran is a 78 y.o. female who presents for post hospital follow up.    She has a history of pancytopenia, followed by Dr. Arline Asp, renal insufficiency, diabetes, hypertension, hyperlipidemia, GERD, osteoporosis, gout, carotid stenosis.  Myoview 3/11 negative for ischemia.  Echo 3/11: EF 55-60%, grade 1 diastolic dysfunction, mild AI, mild MR.  Carotid Dopplers 9/12: RICA 40-59%, LICA 0-39%.    She was admitted 3/12-3/14 with chest pain.  Lower extremity Dopplers were negative for DVT.  She ruled out for myocardial infarction by enzymes.  CT angiogram demonstrated no pulmonary embolism but did demonstrate coronary atherosclerosis.  She also had anterior T-wave inversions on ECG.  Given her presenting picture she was set up for cardiac catheterization.  LHC 06/30/11: LAD diffuse 40-50%, circumflex irregular with less than 10% stenosis, mid RCA 50-60%, EF 55-65%.  Medical therapy was recommended.  Nitrates were added but she was intolerant of this and it was discontinued.  Labs: Hemoglobin 8.8, potassium 4.0, creatinine 1.15, ALT 12, cardiac enzymes negative x3, TC 118, HDL 45, LDL 59, triglycerides 71, d-dimer 0.81.  Chest x-ray demonstrated mild cardiomegaly and no acute infiltrate or effusion.  Doing well.  The patient denies chest pain, shortness of breath, syncope, orthopnea, PND or significant pedal edema.    Past Medical History  Diagnosis Date  . Esophageal reflux   . Other and unspecified hyperlipidemia   . Unspecified essential hypertension   . Type II or unspecified type diabetes mellitus without mention of complication, not stated as uncontrolled   . Macular  degeneration (senile) of retina, unspecified   . Osteoporosis   . Anemia     Felt secondary renal insuffiency, seen by Dr. Arline Asp - Workup in the past has included normal iron, folate, B12, serum and urine protein electrophoresis as well as an ANA. Peripheral blood smear had been normal  . Pancytopenia 2010    Intermittent mild leukopenia and thrombocytopenia felt to be most likely due to immune dysregulation per Dr. Arline Asp  . Dyslipidemia   . Gout   . Carotid arterial disease     Last dopplers 12/2010 - RICA 40-59%, LICA 0-39% for recheck in 1 year  . Coronary artery disease     Moderate nonobstructive disease by cath 06/2011 (ruled out for MI/PE at that time)    Current Outpatient Prescriptions  Medication Sig Dispense Refill  . allopurinol (ZYLOPRIM) 100 MG tablet Take 100 mg by mouth daily.        Marland Kitchen aspirin 81 MG tablet Take 81 mg by mouth daily.        . Bevacizumab (AVASTIN IV) Inject into the vein. Injection in left eye. Every 14 weeks.       . bisoprolol (ZEBETA) 5 MG tablet Take 0.5 tablets (2.5 mg total) by mouth 2 (two) times daily.  30 tablet  6  . Calcium Carbonate (CALTRATE 600 PO) Take 1 tablet by mouth daily.      . Cinnamon 500 MG capsule Take 1,000 mg by mouth 2 (two) times daily.       . diazepam (VALIUM) 5 MG tablet Take 5 mg by mouth as  needed.        . Iron-Vitamins (GERITOL COMPLETE) TABS Take by mouth daily.        Marland Kitchen lisinopril (PRINIVIL,ZESTRIL) 10 MG tablet Take 10 mg by mouth daily.      Marland Kitchen moxifloxacin (VIGAMOX) 0.5 % ophthalmic solution 1 drop as directed. Takes with injection in the eye      . Multiple Vitamins-Minerals (CENTRUM SILVER ULTRA MENS) TABS Take by mouth daily.        . Multiple Vitamins-Minerals (OCUVITE PRESERVISION) TABS Take by mouth daily.        . Nutritional Supplements (VITAMIN D MAINTENANCE PO) Take 1 tablet by mouth daily.      . pioglitazone (ACTOS) 15 MG tablet Take 2 tablets (30 mg total) by mouth daily. SEE COMMENT      .  Polyethyl Glycol-Propyl Glycol (SYSTANE) 0.4-0.3 % SOLN Apply to eye as directed.        . simvastatin (ZOCOR) 20 MG tablet Take 20 mg by mouth at bedtime.          Allergies: Allergies  Allergen Reactions  . Hydrocodone   . Nitroglycerin     Pt states it makes her pass out  . Ofloxacin   . Sulfonamide Derivatives     History  Substance Use Topics  . Smoking status: Never Smoker   . Smokeless tobacco: Never Used  . Alcohol Use: No     ROS:  Please see the history of present illness.    All other systems reviewed and negative.   PHYSICAL EXAM: VS:  BP 150/80  Pulse 50  Ht 5' 1.5" (1.562 m)  Wt 132 lb (59.875 kg)  BMI 24.54 kg/m2 Well nourished, well developed, in no acute distress HEENT: normal Neck: no JVD Cardiac:  normal S1, S2; RRR; no murmur Lungs:  clear to auscultation bilaterally, no wheezing, rhonchi or rales Abd: soft, nontender, no hepatomegaly Ext: no edema; right wrist without hematoma or bruit Skin: warm and dry Neuro:  CNs 2-12 intact, no focal abnormalities noted  EKG:  Sinus bradycardia, rate 50, rightward axis, low voltage, no change from prior tracing  ASSESSMENT AND PLAN:  1. Coronary atherosclerosis of native coronary artery  Stable.  No further chest pain.  Continue current Rx with ASA and statin.  Follow up with Dr. Olga Millers in 3 mos.     2. HYPERTENSION  Usually better controlled.  Monitor for now.  Repeat BMET today.   3. Anemia  Repeat CBC today.  Follow up with PCP.            Signed, Tereso Newcomer, PA-C  2:13 PM 07/06/2011

## 2011-07-06 NOTE — Patient Instructions (Signed)
Your physician recommends that you schedule a follow-up appointment in: 3 MONTHS WITH DR. CRENSHAW  Your physician recommends that you return for lab work in: BMET, CBC W/DIFF

## 2011-07-06 NOTE — Telephone Encounter (Signed)
Message copied by Tarri Fuller on Tue Jul 06, 2011  5:31 PM ------      Message from: [place], [place] T      Created: Tue Jul 06, 2011  4:46 PM       Hgb stable      Send copy of labs to PCP      BUN up some - drink plenty of water      Tereso Newcomer, [place]  4:46 PM 07/06/2011

## 2011-07-07 ENCOUNTER — Telehealth: Payer: Self-pay | Admitting: *Deleted

## 2011-07-07 NOTE — Telephone Encounter (Signed)
lmom for ptcb to go over lab results and recommendations. Danielle Rankin

## 2011-07-07 NOTE — Telephone Encounter (Signed)
Message copied by Tarri Fuller on Wed Jul 07, 2011  3:38 PM ------      Message from: [place], [place] T      Created: Tue Jul 06, 2011  4:46 PM       Hgb stable      Send copy of labs to PCP      BUN up some - drink plenty of water      Tereso Newcomer, [place]  4:46 PM 07/06/2011

## 2011-07-08 ENCOUNTER — Telehealth: Payer: Self-pay | Admitting: Cardiology

## 2011-07-08 NOTE — Telephone Encounter (Signed)
Pt calling for blood work results

## 2011-07-08 NOTE — Telephone Encounter (Signed)
Spoke with pt,  pt aware of LAB RESULTS

## 2011-07-12 ENCOUNTER — Encounter: Payer: Medicare HMO | Admitting: Physician Assistant

## 2011-07-24 NOTE — ED Provider Notes (Signed)
History/physical exam/procedure(s) were performed by non-physician practitioner and as supervising physician I was immediately available for consultation/collaboration. I have reviewed all notes and am in agreement with care and plan.   Hilario Quarry, MD 07/24/11 [phone]

## 2011-07-29 ENCOUNTER — Encounter (INDEPENDENT_AMBULATORY_CARE_PROVIDER_SITE_OTHER): Payer: Medicare HMO | Admitting: Ophthalmology

## 2011-07-29 DIAGNOSIS — E11319 Type 2 diabetes mellitus with unspecified diabetic retinopathy without macular edema: Secondary | ICD-10-CM

## 2011-07-29 DIAGNOSIS — H353290 Exudative age-related macular degeneration, unspecified eye, stage unspecified: Secondary | ICD-10-CM

## 2011-07-29 DIAGNOSIS — H43819 Vitreous degeneration, unspecified eye: Secondary | ICD-10-CM

## 2011-07-29 DIAGNOSIS — H3530 Unspecified macular degeneration: Secondary | ICD-10-CM

## 2011-07-29 DIAGNOSIS — H2510 Age-related nuclear cataract, unspecified eye: Secondary | ICD-10-CM

## 2011-07-29 DIAGNOSIS — E1139 Type 2 diabetes mellitus with other diabetic ophthalmic complication: Secondary | ICD-10-CM

## 2011-09-09 ENCOUNTER — Encounter (INDEPENDENT_AMBULATORY_CARE_PROVIDER_SITE_OTHER): Payer: Medicare HMO | Admitting: Ophthalmology

## 2011-09-09 DIAGNOSIS — H353190 Nonexudative age-related macular degeneration, unspecified eye, stage unspecified: Secondary | ICD-10-CM

## 2011-09-09 DIAGNOSIS — H43819 Vitreous degeneration, unspecified eye: Secondary | ICD-10-CM

## 2011-09-09 DIAGNOSIS — H2510 Age-related nuclear cataract, unspecified eye: Secondary | ICD-10-CM

## 2011-09-09 DIAGNOSIS — E11319 Type 2 diabetes mellitus with unspecified diabetic retinopathy without macular edema: Secondary | ICD-10-CM

## 2011-09-09 DIAGNOSIS — E1165 Type 2 diabetes mellitus with hyperglycemia: Secondary | ICD-10-CM

## 2011-09-09 DIAGNOSIS — E1139 Type 2 diabetes mellitus with other diabetic ophthalmic complication: Secondary | ICD-10-CM

## 2011-09-09 DIAGNOSIS — H353290 Exudative age-related macular degeneration, unspecified eye, stage unspecified: Secondary | ICD-10-CM

## 2011-10-08 ENCOUNTER — Encounter: Payer: Self-pay | Admitting: Cardiology

## 2011-10-08 ENCOUNTER — Ambulatory Visit (INDEPENDENT_AMBULATORY_CARE_PROVIDER_SITE_OTHER): Payer: Medicare HMO | Admitting: Cardiology

## 2011-10-08 VITALS — BP 135/49 | HR 53 | Wt 136.0 lb

## 2011-10-08 DIAGNOSIS — I2510 Atherosclerotic heart disease of native coronary artery without angina pectoris: Secondary | ICD-10-CM

## 2011-10-08 DIAGNOSIS — E785 Hyperlipidemia, unspecified: Secondary | ICD-10-CM

## 2011-10-08 DIAGNOSIS — I6529 Occlusion and stenosis of unspecified carotid artery: Secondary | ICD-10-CM

## 2011-10-08 NOTE — Assessment & Plan Note (Signed)
Blood pressure controlled. Continue present medications. 

## 2011-10-08 NOTE — Assessment & Plan Note (Signed)
Continue statin. Lipids and liver monitored by primary care. 

## 2011-10-08 NOTE — Progress Notes (Signed)
HPI: Pleasant female that I have seen in the past for chest pain and near syncope. Previous cardiac catheterization in 1991 apparently okay per patient. Carotid Dopplers 9/12: RICA 40-59%, LICA 0-39%. FU one year. Myoview performed in March of 2011. Ejection fraction was 70%. There was mild apical thinning but no ischemia. An echocardiogram was also performed in March of 2011. Ejection fraction was normal. There was mild aortic and mitral regurgitation. A CardioNet monitor was performed in November of 2011 related to syncope and revealed sinus with pacs. She was admitted 3/13 with chest pain. Lower extremity Dopplers were negative for DVT. She ruled out for myocardial infarction by enzymes. CT angiogram demonstrated no pulmonary embolism but did demonstrate coronary atherosclerosis. She also had anterior T-wave inversions on ECG. Given her presenting picture she was set up for cardiac catheterization. LHC 06/30/11: LAD diffuse 40-50%, circumflex irregular with less than 10% stenosis, mid RCA 50-60%, EF 55-65%. Medical therapy was recommended. Since she was last seen, the patient has dyspnea with more extreme activities but not with routine activities. It is relieved with rest. It is not associated with chest pain. There is no orthopnea, PND or pedal edema. There is no syncope or palpitations. There is no exertional chest pain.     Current Outpatient Prescriptions  Medication Sig Dispense Refill  . allopurinol (ZYLOPRIM) 100 MG tablet Take 100 mg by mouth daily.        Marland Kitchen aspirin 81 MG tablet Take 81 mg by mouth daily.        . Bevacizumab (AVASTIN IV) Inject into the vein. Injection in left eye. Every 14 weeks.       . bisoprolol (ZEBETA) 5 MG tablet Take 0.5 tablets (2.5 mg total) by mouth 2 (two) times daily.  180 tablet  3  . Calcium Carbonate (CALTRATE 600 PO) Take 1 tablet by mouth daily.      . Cinnamon 500 MG capsule Take 1,000 mg by mouth 2 (two) times daily.       . diazepam (VALIUM) 5 MG  tablet Take 5 mg by mouth as needed.        . Iron-Vitamins (GERITOL COMPLETE) TABS Take by mouth daily.        Marland Kitchen lisinopril (PRINIVIL,ZESTRIL) 20 MG tablet Take 20 mg by mouth daily.      Marland Kitchen moxifloxacin (VIGAMOX) 0.5 % ophthalmic solution 1 drop as directed. Takes with injection in the eye      . Multiple Vitamins-Minerals (CENTRUM SILVER ULTRA MENS) TABS Take by mouth daily.        . Multiple Vitamins-Minerals (OCUVITE PRESERVISION) TABS Take by mouth daily.        . Nutritional Supplements (VITAMIN D MAINTENANCE PO) Take 1 tablet by mouth daily.      . pioglitazone (ACTOS) 15 MG tablet Take 2 tablets (30 mg total) by mouth daily. SEE COMMENT      . Polyethyl Glycol-Propyl Glycol (SYSTANE) 0.4-0.3 % SOLN Apply to eye as directed.        . simvastatin (ZOCOR) 20 MG tablet Take 20 mg by mouth at bedtime.           Past Medical History  Diagnosis Date  . Esophageal reflux   . Other and unspecified hyperlipidemia   . Unspecified essential hypertension   . Type II or unspecified type diabetes mellitus without mention of complication, not stated as uncontrolled   . Macular degeneration (senile) of retina, unspecified   . Osteoporosis   . Anemia  Felt secondary renal insuffiency, seen by Dr. Arline Asp - Workup in the past has included normal iron, folate, B12, serum and urine protein electrophoresis as well as an ANA. Peripheral blood smear had been normal  . Pancytopenia 2010    Intermittent mild leukopenia and thrombocytopenia felt to be most likely due to immune dysregulation per Dr. Arline Asp  . Dyslipidemia   . Gout   . Carotid arterial disease     Last dopplers 12/2010 - RICA 40-59%, LICA 0-39% for recheck in 1 year  . Coronary artery disease     Moderate nonobstructive disease by cath 06/2011 (ruled out for MI/PE at that time)    Past Surgical History  Procedure Date  . Appendectomy     age 17  . Abdominal hysterectomy     cyst removal  . Abdominal cyst     History    Social History  . Marital Status: Widowed    Spouse Name: N/A    Number of Children: N/A  . Years of Education: N/A   Occupational History  . Not on file.   Social History Main Topics  . Smoking status: Never Smoker   . Smokeless tobacco: Never Used  . Alcohol Use: No  . Drug Use: No  . Sexually Active: No   Other Topics Concern  . 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     ROS: no fevers or chills, productive cough, hemoptysis, dysphasia, odynophagia, melena, hematochezia, dysuria, hematuria, rash, seizure activity, orthopnea, PND, pedal edema, claudication. Remaining systems are negative.  Physical Exam: Well-developed well-nourished in no acute distress.  Skin is warm and dry.  HEENT is normal.  Neck is supple.  Chest is clear to auscultation with normal expansion.  Cardiovascular exam is regular rate and rhythm. 1/6 systolic ejection murmur Abdominal exam nontender or distended. No masses palpated. Extremities show no edema. neuro grossly intact  ECG sinus rhythm at a rate of 53. Right axis deviation. Nonspecific ST changes.

## 2011-10-08 NOTE — Patient Instructions (Addendum)
Your physician wants you to follow-up in: ONE YEAR WITH DR Shelda Pal will receive a reminder letter in the mail two months in advance. If you don't receive a letter, please call our office to schedule the follow-up appointment.   Your physician has requested that you have a carotid duplex. This test is an ultrasound of the carotid arteries in your neck. It looks at blood flow through these arteries that supply the brain with blood. Allow one hour for this exam. There are no restrictions or special instructions. SCHEDULE IN SEPT

## 2011-10-08 NOTE — Assessment & Plan Note (Signed)
Continue aspirin and statin. Followup carotid Dopplers September 2013. 

## 2011-10-08 NOTE — Assessment & Plan Note (Signed)
Continue aspirin and statin. 

## 2011-10-28 ENCOUNTER — Encounter (INDEPENDENT_AMBULATORY_CARE_PROVIDER_SITE_OTHER): Payer: Medicare HMO | Admitting: Ophthalmology

## 2011-10-28 DIAGNOSIS — H35039 Hypertensive retinopathy, unspecified eye: Secondary | ICD-10-CM

## 2011-10-28 DIAGNOSIS — H3530 Unspecified macular degeneration: Secondary | ICD-10-CM

## 2011-10-28 DIAGNOSIS — H353290 Exudative age-related macular degeneration, unspecified eye, stage unspecified: Secondary | ICD-10-CM

## 2011-10-28 DIAGNOSIS — E11319 Type 2 diabetes mellitus with unspecified diabetic retinopathy without macular edema: Secondary | ICD-10-CM

## 2011-10-28 DIAGNOSIS — H2510 Age-related nuclear cataract, unspecified eye: Secondary | ICD-10-CM

## 2011-10-28 DIAGNOSIS — H43819 Vitreous degeneration, unspecified eye: Secondary | ICD-10-CM

## 2011-10-28 DIAGNOSIS — E1139 Type 2 diabetes mellitus with other diabetic ophthalmic complication: Secondary | ICD-10-CM

## 2011-10-28 DIAGNOSIS — I10 Essential (primary) hypertension: Secondary | ICD-10-CM

## 2011-12-16 ENCOUNTER — Encounter (INDEPENDENT_AMBULATORY_CARE_PROVIDER_SITE_OTHER): Payer: Medicare HMO | Admitting: Ophthalmology

## 2011-12-16 DIAGNOSIS — I10 Essential (primary) hypertension: Secondary | ICD-10-CM

## 2011-12-16 DIAGNOSIS — E1165 Type 2 diabetes mellitus with hyperglycemia: Secondary | ICD-10-CM

## 2011-12-16 DIAGNOSIS — E1139 Type 2 diabetes mellitus with other diabetic ophthalmic complication: Secondary | ICD-10-CM

## 2011-12-16 DIAGNOSIS — E11319 Type 2 diabetes mellitus with unspecified diabetic retinopathy without macular edema: Secondary | ICD-10-CM

## 2011-12-16 DIAGNOSIS — H3530 Unspecified macular degeneration: Secondary | ICD-10-CM

## 2011-12-16 DIAGNOSIS — H43819 Vitreous degeneration, unspecified eye: Secondary | ICD-10-CM

## 2011-12-16 DIAGNOSIS — H35039 Hypertensive retinopathy, unspecified eye: Secondary | ICD-10-CM

## 2012-01-10 ENCOUNTER — Encounter (INDEPENDENT_AMBULATORY_CARE_PROVIDER_SITE_OTHER): Payer: Medicare HMO

## 2012-01-10 DIAGNOSIS — I6529 Occlusion and stenosis of unspecified carotid artery: Secondary | ICD-10-CM

## 2012-02-10 ENCOUNTER — Encounter (INDEPENDENT_AMBULATORY_CARE_PROVIDER_SITE_OTHER): Payer: Medicare HMO | Admitting: Ophthalmology

## 2012-02-10 DIAGNOSIS — H43819 Vitreous degeneration, unspecified eye: Secondary | ICD-10-CM

## 2012-02-10 DIAGNOSIS — H2510 Age-related nuclear cataract, unspecified eye: Secondary | ICD-10-CM

## 2012-02-10 DIAGNOSIS — E11319 Type 2 diabetes mellitus with unspecified diabetic retinopathy without macular edema: Secondary | ICD-10-CM

## 2012-02-10 DIAGNOSIS — I10 Essential (primary) hypertension: Secondary | ICD-10-CM

## 2012-02-10 DIAGNOSIS — E1139 Type 2 diabetes mellitus with other diabetic ophthalmic complication: Secondary | ICD-10-CM

## 2012-02-10 DIAGNOSIS — H35039 Hypertensive retinopathy, unspecified eye: Secondary | ICD-10-CM

## 2012-02-10 DIAGNOSIS — H353290 Exudative age-related macular degeneration, unspecified eye, stage unspecified: Secondary | ICD-10-CM

## 2012-02-10 DIAGNOSIS — H3530 Unspecified macular degeneration: Secondary | ICD-10-CM

## 2012-02-10 DIAGNOSIS — E1165 Type 2 diabetes mellitus with hyperglycemia: Secondary | ICD-10-CM

## 2012-03-23 ENCOUNTER — Encounter (INDEPENDENT_AMBULATORY_CARE_PROVIDER_SITE_OTHER): Payer: Medicare HMO | Admitting: Ophthalmology

## 2012-03-23 DIAGNOSIS — H3530 Unspecified macular degeneration: Secondary | ICD-10-CM

## 2012-03-23 DIAGNOSIS — E1139 Type 2 diabetes mellitus with other diabetic ophthalmic complication: Secondary | ICD-10-CM

## 2012-03-23 DIAGNOSIS — H43819 Vitreous degeneration, unspecified eye: Secondary | ICD-10-CM

## 2012-03-23 DIAGNOSIS — E11319 Type 2 diabetes mellitus with unspecified diabetic retinopathy without macular edema: Secondary | ICD-10-CM

## 2012-03-23 DIAGNOSIS — H2510 Age-related nuclear cataract, unspecified eye: Secondary | ICD-10-CM

## 2012-03-23 DIAGNOSIS — H353290 Exudative age-related macular degeneration, unspecified eye, stage unspecified: Secondary | ICD-10-CM

## 2012-03-23 DIAGNOSIS — I10 Essential (primary) hypertension: Secondary | ICD-10-CM

## 2012-03-23 DIAGNOSIS — H35039 Hypertensive retinopathy, unspecified eye: Secondary | ICD-10-CM

## 2012-04-10 ENCOUNTER — Encounter: Payer: Self-pay | Admitting: Cardiology

## 2012-04-19 ENCOUNTER — Other Ambulatory Visit: Payer: Self-pay | Admitting: Cardiology

## 2012-05-09 ENCOUNTER — Other Ambulatory Visit: Payer: Self-pay | Admitting: Internal Medicine

## 2012-05-09 DIAGNOSIS — Z1231 Encounter for screening mammogram for malignant neoplasm of breast: Secondary | ICD-10-CM

## 2012-05-11 ENCOUNTER — Encounter (INDEPENDENT_AMBULATORY_CARE_PROVIDER_SITE_OTHER): Payer: Medicare HMO | Admitting: Ophthalmology

## 2012-05-11 DIAGNOSIS — H3530 Unspecified macular degeneration: Secondary | ICD-10-CM

## 2012-05-11 DIAGNOSIS — I10 Essential (primary) hypertension: Secondary | ICD-10-CM

## 2012-05-11 DIAGNOSIS — E11319 Type 2 diabetes mellitus with unspecified diabetic retinopathy without macular edema: Secondary | ICD-10-CM

## 2012-05-11 DIAGNOSIS — E1139 Type 2 diabetes mellitus with other diabetic ophthalmic complication: Secondary | ICD-10-CM

## 2012-05-11 DIAGNOSIS — H35039 Hypertensive retinopathy, unspecified eye: Secondary | ICD-10-CM

## 2012-05-11 DIAGNOSIS — H353290 Exudative age-related macular degeneration, unspecified eye, stage unspecified: Secondary | ICD-10-CM

## 2012-05-11 DIAGNOSIS — H43819 Vitreous degeneration, unspecified eye: Secondary | ICD-10-CM

## 2012-05-11 DIAGNOSIS — E1165 Type 2 diabetes mellitus with hyperglycemia: Secondary | ICD-10-CM

## 2012-06-16 ENCOUNTER — Ambulatory Visit
Admission: RE | Admit: 2012-06-16 | Discharge: 2012-06-16 | Disposition: A | Discharge status: Home / Self Care | Payer: Medicare HMO | Source: Ambulatory Visit | Attending: Internal Medicine | Admitting: Internal Medicine

## 2012-06-16 DIAGNOSIS — Z1231 Encounter for screening mammogram for malignant neoplasm of breast: Secondary | ICD-10-CM

## 2012-07-06 ENCOUNTER — Encounter (INDEPENDENT_AMBULATORY_CARE_PROVIDER_SITE_OTHER): Payer: Medicare HMO | Admitting: Ophthalmology

## 2012-07-06 DIAGNOSIS — H3530 Unspecified macular degeneration: Secondary | ICD-10-CM

## 2012-07-06 DIAGNOSIS — I10 Essential (primary) hypertension: Secondary | ICD-10-CM

## 2012-07-06 DIAGNOSIS — E11319 Type 2 diabetes mellitus with unspecified diabetic retinopathy without macular edema: Secondary | ICD-10-CM

## 2012-07-06 DIAGNOSIS — H35039 Hypertensive retinopathy, unspecified eye: Secondary | ICD-10-CM

## 2012-07-06 DIAGNOSIS — E1139 Type 2 diabetes mellitus with other diabetic ophthalmic complication: Secondary | ICD-10-CM

## 2012-07-06 DIAGNOSIS — H43819 Vitreous degeneration, unspecified eye: Secondary | ICD-10-CM

## 2012-07-06 DIAGNOSIS — H353290 Exudative age-related macular degeneration, unspecified eye, stage unspecified: Secondary | ICD-10-CM

## 2012-08-24 ENCOUNTER — Other Ambulatory Visit: Payer: Self-pay | Admitting: Physician Assistant

## 2012-08-31 ENCOUNTER — Encounter (INDEPENDENT_AMBULATORY_CARE_PROVIDER_SITE_OTHER): Payer: Medicare HMO | Admitting: Ophthalmology

## 2012-08-31 DIAGNOSIS — H3530 Unspecified macular degeneration: Secondary | ICD-10-CM

## 2012-08-31 DIAGNOSIS — H353290 Exudative age-related macular degeneration, unspecified eye, stage unspecified: Secondary | ICD-10-CM

## 2012-08-31 DIAGNOSIS — H43819 Vitreous degeneration, unspecified eye: Secondary | ICD-10-CM

## 2012-08-31 DIAGNOSIS — H35039 Hypertensive retinopathy, unspecified eye: Secondary | ICD-10-CM

## 2012-08-31 DIAGNOSIS — I10 Essential (primary) hypertension: Secondary | ICD-10-CM

## 2012-08-31 DIAGNOSIS — E1139 Type 2 diabetes mellitus with other diabetic ophthalmic complication: Secondary | ICD-10-CM

## 2012-08-31 DIAGNOSIS — E11319 Type 2 diabetes mellitus with unspecified diabetic retinopathy without macular edema: Secondary | ICD-10-CM

## 2012-09-14 ENCOUNTER — Other Ambulatory Visit: Payer: Self-pay | Admitting: Cardiology

## 2012-11-02 ENCOUNTER — Encounter (INDEPENDENT_AMBULATORY_CARE_PROVIDER_SITE_OTHER): Payer: Medicare HMO | Admitting: Ophthalmology

## 2012-11-02 DIAGNOSIS — H35039 Hypertensive retinopathy, unspecified eye: Secondary | ICD-10-CM

## 2012-11-02 DIAGNOSIS — H43819 Vitreous degeneration, unspecified eye: Secondary | ICD-10-CM

## 2012-11-02 DIAGNOSIS — E1139 Type 2 diabetes mellitus with other diabetic ophthalmic complication: Secondary | ICD-10-CM

## 2012-11-02 DIAGNOSIS — H3530 Unspecified macular degeneration: Secondary | ICD-10-CM

## 2012-11-02 DIAGNOSIS — E11319 Type 2 diabetes mellitus with unspecified diabetic retinopathy without macular edema: Secondary | ICD-10-CM

## 2012-11-02 DIAGNOSIS — H353290 Exudative age-related macular degeneration, unspecified eye, stage unspecified: Secondary | ICD-10-CM

## 2012-11-02 DIAGNOSIS — E1165 Type 2 diabetes mellitus with hyperglycemia: Secondary | ICD-10-CM

## 2012-11-02 DIAGNOSIS — I10 Essential (primary) hypertension: Secondary | ICD-10-CM

## 2012-11-25 ENCOUNTER — Other Ambulatory Visit: Payer: Self-pay | Admitting: Cardiology

## 2012-12-14 ENCOUNTER — Encounter (INDEPENDENT_AMBULATORY_CARE_PROVIDER_SITE_OTHER): Payer: Medicare HMO | Admitting: Ophthalmology

## 2012-12-14 DIAGNOSIS — H43819 Vitreous degeneration, unspecified eye: Secondary | ICD-10-CM

## 2012-12-14 DIAGNOSIS — H3530 Unspecified macular degeneration: Secondary | ICD-10-CM

## 2012-12-14 DIAGNOSIS — E1139 Type 2 diabetes mellitus with other diabetic ophthalmic complication: Secondary | ICD-10-CM

## 2012-12-14 DIAGNOSIS — E11319 Type 2 diabetes mellitus with unspecified diabetic retinopathy without macular edema: Secondary | ICD-10-CM

## 2012-12-14 DIAGNOSIS — H353290 Exudative age-related macular degeneration, unspecified eye, stage unspecified: Secondary | ICD-10-CM

## 2012-12-14 DIAGNOSIS — H35039 Hypertensive retinopathy, unspecified eye: Secondary | ICD-10-CM

## 2012-12-14 DIAGNOSIS — I10 Essential (primary) hypertension: Secondary | ICD-10-CM

## 2013-01-10 ENCOUNTER — Encounter (INDEPENDENT_AMBULATORY_CARE_PROVIDER_SITE_OTHER): Payer: Medicare HMO

## 2013-01-10 DIAGNOSIS — I6529 Occlusion and stenosis of unspecified carotid artery: Secondary | ICD-10-CM

## 2013-02-01 ENCOUNTER — Encounter (INDEPENDENT_AMBULATORY_CARE_PROVIDER_SITE_OTHER): Payer: Medicare HMO | Admitting: Ophthalmology

## 2013-02-01 DIAGNOSIS — H43819 Vitreous degeneration, unspecified eye: Secondary | ICD-10-CM

## 2013-02-01 DIAGNOSIS — I10 Essential (primary) hypertension: Secondary | ICD-10-CM

## 2013-02-01 DIAGNOSIS — E1139 Type 2 diabetes mellitus with other diabetic ophthalmic complication: Secondary | ICD-10-CM

## 2013-02-01 DIAGNOSIS — H353290 Exudative age-related macular degeneration, unspecified eye, stage unspecified: Secondary | ICD-10-CM

## 2013-02-01 DIAGNOSIS — H35039 Hypertensive retinopathy, unspecified eye: Secondary | ICD-10-CM

## 2013-02-01 DIAGNOSIS — H3530 Unspecified macular degeneration: Secondary | ICD-10-CM

## 2013-02-01 DIAGNOSIS — E11319 Type 2 diabetes mellitus with unspecified diabetic retinopathy without macular edema: Secondary | ICD-10-CM

## 2013-03-02 ENCOUNTER — Other Ambulatory Visit: Payer: Self-pay | Admitting: Cardiology

## 2013-03-22 ENCOUNTER — Encounter (INDEPENDENT_AMBULATORY_CARE_PROVIDER_SITE_OTHER): Payer: Medicare HMO | Admitting: Ophthalmology

## 2013-03-22 DIAGNOSIS — H2510 Age-related nuclear cataract, unspecified eye: Secondary | ICD-10-CM

## 2013-03-22 DIAGNOSIS — I10 Essential (primary) hypertension: Secondary | ICD-10-CM

## 2013-03-22 DIAGNOSIS — H43819 Vitreous degeneration, unspecified eye: Secondary | ICD-10-CM

## 2013-03-22 DIAGNOSIS — E1139 Type 2 diabetes mellitus with other diabetic ophthalmic complication: Secondary | ICD-10-CM

## 2013-03-22 DIAGNOSIS — H3530 Unspecified macular degeneration: Secondary | ICD-10-CM

## 2013-03-22 DIAGNOSIS — E11319 Type 2 diabetes mellitus with unspecified diabetic retinopathy without macular edema: Secondary | ICD-10-CM

## 2013-03-22 DIAGNOSIS — H353290 Exudative age-related macular degeneration, unspecified eye, stage unspecified: Secondary | ICD-10-CM

## 2013-03-22 DIAGNOSIS — H35039 Hypertensive retinopathy, unspecified eye: Secondary | ICD-10-CM

## 2013-04-26 ENCOUNTER — Encounter (INDEPENDENT_AMBULATORY_CARE_PROVIDER_SITE_OTHER): Payer: Medicare HMO | Admitting: Ophthalmology

## 2013-04-26 DIAGNOSIS — H43819 Vitreous degeneration, unspecified eye: Secondary | ICD-10-CM

## 2013-04-26 DIAGNOSIS — E1139 Type 2 diabetes mellitus with other diabetic ophthalmic complication: Secondary | ICD-10-CM

## 2013-04-26 DIAGNOSIS — H35039 Hypertensive retinopathy, unspecified eye: Secondary | ICD-10-CM

## 2013-04-26 DIAGNOSIS — E11319 Type 2 diabetes mellitus with unspecified diabetic retinopathy without macular edema: Secondary | ICD-10-CM

## 2013-04-26 DIAGNOSIS — H3530 Unspecified macular degeneration: Secondary | ICD-10-CM

## 2013-04-26 DIAGNOSIS — H353290 Exudative age-related macular degeneration, unspecified eye, stage unspecified: Secondary | ICD-10-CM

## 2013-04-26 DIAGNOSIS — E1165 Type 2 diabetes mellitus with hyperglycemia: Secondary | ICD-10-CM

## 2013-04-26 DIAGNOSIS — I10 Essential (primary) hypertension: Secondary | ICD-10-CM

## 2013-04-29 ENCOUNTER — Other Ambulatory Visit: Payer: Self-pay | Admitting: Cardiology

## 2013-05-02 ENCOUNTER — Other Ambulatory Visit: Payer: Self-pay | Admitting: Cardiology

## 2013-05-23 ENCOUNTER — Other Ambulatory Visit: Payer: Self-pay

## 2013-05-23 DIAGNOSIS — Z1231 Encounter for screening mammogram for malignant neoplasm of breast: Secondary | ICD-10-CM

## 2013-06-07 ENCOUNTER — Encounter (INDEPENDENT_AMBULATORY_CARE_PROVIDER_SITE_OTHER): Payer: Medicare HMO | Admitting: Ophthalmology

## 2013-06-07 DIAGNOSIS — H43819 Vitreous degeneration, unspecified eye: Secondary | ICD-10-CM

## 2013-06-07 DIAGNOSIS — H3530 Unspecified macular degeneration: Secondary | ICD-10-CM

## 2013-06-07 DIAGNOSIS — E1165 Type 2 diabetes mellitus with hyperglycemia: Secondary | ICD-10-CM

## 2013-06-07 DIAGNOSIS — E11319 Type 2 diabetes mellitus with unspecified diabetic retinopathy without macular edema: Secondary | ICD-10-CM

## 2013-06-07 DIAGNOSIS — I10 Essential (primary) hypertension: Secondary | ICD-10-CM

## 2013-06-07 DIAGNOSIS — H353290 Exudative age-related macular degeneration, unspecified eye, stage unspecified: Secondary | ICD-10-CM

## 2013-06-07 DIAGNOSIS — E1139 Type 2 diabetes mellitus with other diabetic ophthalmic complication: Secondary | ICD-10-CM

## 2013-06-07 DIAGNOSIS — H35039 Hypertensive retinopathy, unspecified eye: Secondary | ICD-10-CM

## 2013-06-18 ENCOUNTER — Ambulatory Visit
Admission: RE | Admit: 2013-06-18 | Discharge: 2013-06-18 | Disposition: A | Discharge status: Home / Self Care | Payer: Medicare HMO | Source: Ambulatory Visit

## 2013-06-18 DIAGNOSIS — Z1231 Encounter for screening mammogram for malignant neoplasm of breast: Secondary | ICD-10-CM

## 2013-07-12 ENCOUNTER — Encounter (INDEPENDENT_AMBULATORY_CARE_PROVIDER_SITE_OTHER): Payer: Medicare HMO | Admitting: Ophthalmology

## 2013-07-12 DIAGNOSIS — E11319 Type 2 diabetes mellitus with unspecified diabetic retinopathy without macular edema: Secondary | ICD-10-CM

## 2013-07-12 DIAGNOSIS — E1139 Type 2 diabetes mellitus with other diabetic ophthalmic complication: Secondary | ICD-10-CM

## 2013-07-12 DIAGNOSIS — H43819 Vitreous degeneration, unspecified eye: Secondary | ICD-10-CM

## 2013-07-12 DIAGNOSIS — E1165 Type 2 diabetes mellitus with hyperglycemia: Secondary | ICD-10-CM

## 2013-07-12 DIAGNOSIS — H3530 Unspecified macular degeneration: Secondary | ICD-10-CM

## 2013-07-12 DIAGNOSIS — H353290 Exudative age-related macular degeneration, unspecified eye, stage unspecified: Secondary | ICD-10-CM

## 2013-07-12 DIAGNOSIS — H35039 Hypertensive retinopathy, unspecified eye: Secondary | ICD-10-CM

## 2013-07-12 DIAGNOSIS — I10 Essential (primary) hypertension: Secondary | ICD-10-CM

## 2013-08-16 ENCOUNTER — Encounter (INDEPENDENT_AMBULATORY_CARE_PROVIDER_SITE_OTHER): Payer: Medicare HMO | Admitting: Ophthalmology

## 2013-08-16 DIAGNOSIS — E1139 Type 2 diabetes mellitus with other diabetic ophthalmic complication: Secondary | ICD-10-CM

## 2013-08-16 DIAGNOSIS — H35039 Hypertensive retinopathy, unspecified eye: Secondary | ICD-10-CM

## 2013-08-16 DIAGNOSIS — H3530 Unspecified macular degeneration: Secondary | ICD-10-CM

## 2013-08-16 DIAGNOSIS — E1165 Type 2 diabetes mellitus with hyperglycemia: Secondary | ICD-10-CM

## 2013-08-16 DIAGNOSIS — I10 Essential (primary) hypertension: Secondary | ICD-10-CM

## 2013-08-16 DIAGNOSIS — H353290 Exudative age-related macular degeneration, unspecified eye, stage unspecified: Secondary | ICD-10-CM

## 2013-08-16 DIAGNOSIS — E11319 Type 2 diabetes mellitus with unspecified diabetic retinopathy without macular edema: Secondary | ICD-10-CM

## 2013-08-16 DIAGNOSIS — H43819 Vitreous degeneration, unspecified eye: Secondary | ICD-10-CM

## 2013-09-20 ENCOUNTER — Encounter (INDEPENDENT_AMBULATORY_CARE_PROVIDER_SITE_OTHER): Payer: Medicare HMO | Admitting: Ophthalmology

## 2013-09-20 DIAGNOSIS — E1139 Type 2 diabetes mellitus with other diabetic ophthalmic complication: Secondary | ICD-10-CM

## 2013-09-20 DIAGNOSIS — I10 Essential (primary) hypertension: Secondary | ICD-10-CM

## 2013-09-20 DIAGNOSIS — H3530 Unspecified macular degeneration: Secondary | ICD-10-CM

## 2013-09-20 DIAGNOSIS — H35039 Hypertensive retinopathy, unspecified eye: Secondary | ICD-10-CM

## 2013-09-20 DIAGNOSIS — E11319 Type 2 diabetes mellitus with unspecified diabetic retinopathy without macular edema: Secondary | ICD-10-CM

## 2013-09-20 DIAGNOSIS — H43819 Vitreous degeneration, unspecified eye: Secondary | ICD-10-CM

## 2013-09-20 DIAGNOSIS — H353290 Exudative age-related macular degeneration, unspecified eye, stage unspecified: Secondary | ICD-10-CM

## 2013-09-20 DIAGNOSIS — E1165 Type 2 diabetes mellitus with hyperglycemia: Secondary | ICD-10-CM

## 2013-10-25 ENCOUNTER — Encounter (INDEPENDENT_AMBULATORY_CARE_PROVIDER_SITE_OTHER): Payer: Medicare HMO | Admitting: Ophthalmology

## 2013-10-25 DIAGNOSIS — E1139 Type 2 diabetes mellitus with other diabetic ophthalmic complication: Secondary | ICD-10-CM

## 2013-10-25 DIAGNOSIS — H35039 Hypertensive retinopathy, unspecified eye: Secondary | ICD-10-CM

## 2013-10-25 DIAGNOSIS — E11319 Type 2 diabetes mellitus with unspecified diabetic retinopathy without macular edema: Secondary | ICD-10-CM

## 2013-10-25 DIAGNOSIS — H43819 Vitreous degeneration, unspecified eye: Secondary | ICD-10-CM

## 2013-10-25 DIAGNOSIS — E1165 Type 2 diabetes mellitus with hyperglycemia: Secondary | ICD-10-CM

## 2013-10-25 DIAGNOSIS — I10 Essential (primary) hypertension: Secondary | ICD-10-CM

## 2013-10-25 DIAGNOSIS — H353290 Exudative age-related macular degeneration, unspecified eye, stage unspecified: Secondary | ICD-10-CM

## 2013-10-25 DIAGNOSIS — H3530 Unspecified macular degeneration: Secondary | ICD-10-CM

## 2013-10-25 DIAGNOSIS — H2510 Age-related nuclear cataract, unspecified eye: Secondary | ICD-10-CM

## 2013-12-13 ENCOUNTER — Encounter (INDEPENDENT_AMBULATORY_CARE_PROVIDER_SITE_OTHER): Payer: Medicare HMO | Admitting: Ophthalmology

## 2013-12-13 DIAGNOSIS — I10 Essential (primary) hypertension: Secondary | ICD-10-CM

## 2013-12-13 DIAGNOSIS — E1165 Type 2 diabetes mellitus with hyperglycemia: Secondary | ICD-10-CM

## 2013-12-13 DIAGNOSIS — E11319 Type 2 diabetes mellitus with unspecified diabetic retinopathy without macular edema: Secondary | ICD-10-CM

## 2013-12-13 DIAGNOSIS — H3530 Unspecified macular degeneration: Secondary | ICD-10-CM

## 2013-12-13 DIAGNOSIS — H353290 Exudative age-related macular degeneration, unspecified eye, stage unspecified: Secondary | ICD-10-CM

## 2013-12-13 DIAGNOSIS — E1139 Type 2 diabetes mellitus with other diabetic ophthalmic complication: Secondary | ICD-10-CM

## 2013-12-13 DIAGNOSIS — H35039 Hypertensive retinopathy, unspecified eye: Secondary | ICD-10-CM

## 2013-12-13 DIAGNOSIS — H43819 Vitreous degeneration, unspecified eye: Secondary | ICD-10-CM

## 2014-01-10 ENCOUNTER — Encounter (INDEPENDENT_AMBULATORY_CARE_PROVIDER_SITE_OTHER): Payer: Medicare HMO | Admitting: Ophthalmology

## 2014-01-10 DIAGNOSIS — H353290 Exudative age-related macular degeneration, unspecified eye, stage unspecified: Secondary | ICD-10-CM

## 2014-01-10 DIAGNOSIS — E1139 Type 2 diabetes mellitus with other diabetic ophthalmic complication: Secondary | ICD-10-CM

## 2014-01-10 DIAGNOSIS — H3530 Unspecified macular degeneration: Secondary | ICD-10-CM

## 2014-01-10 DIAGNOSIS — H43819 Vitreous degeneration, unspecified eye: Secondary | ICD-10-CM

## 2014-01-10 DIAGNOSIS — I10 Essential (primary) hypertension: Secondary | ICD-10-CM

## 2014-01-10 DIAGNOSIS — H35039 Hypertensive retinopathy, unspecified eye: Secondary | ICD-10-CM

## 2014-01-10 DIAGNOSIS — E1165 Type 2 diabetes mellitus with hyperglycemia: Secondary | ICD-10-CM

## 2014-01-10 DIAGNOSIS — E11319 Type 2 diabetes mellitus with unspecified diabetic retinopathy without macular edema: Secondary | ICD-10-CM

## 2014-02-14 ENCOUNTER — Encounter (INDEPENDENT_AMBULATORY_CARE_PROVIDER_SITE_OTHER): Payer: Medicare HMO | Admitting: Ophthalmology

## 2014-02-14 DIAGNOSIS — E11319 Type 2 diabetes mellitus with unspecified diabetic retinopathy without macular edema: Secondary | ICD-10-CM

## 2014-02-14 DIAGNOSIS — E11329 Type 2 diabetes mellitus with mild nonproliferative diabetic retinopathy without macular edema: Secondary | ICD-10-CM

## 2014-02-14 DIAGNOSIS — H3532 Exudative age-related macular degeneration: Secondary | ICD-10-CM

## 2014-02-14 DIAGNOSIS — H3531 Nonexudative age-related macular degeneration: Secondary | ICD-10-CM

## 2014-02-14 DIAGNOSIS — I10 Essential (primary) hypertension: Secondary | ICD-10-CM

## 2014-02-14 DIAGNOSIS — H43813 Vitreous degeneration, bilateral: Secondary | ICD-10-CM

## 2014-02-14 DIAGNOSIS — H35033 Hypertensive retinopathy, bilateral: Secondary | ICD-10-CM

## 2014-03-22 ENCOUNTER — Encounter (INDEPENDENT_AMBULATORY_CARE_PROVIDER_SITE_OTHER): Payer: Medicare HMO | Admitting: Ophthalmology

## 2014-03-22 DIAGNOSIS — H35033 Hypertensive retinopathy, bilateral: Secondary | ICD-10-CM

## 2014-03-22 DIAGNOSIS — H43813 Vitreous degeneration, bilateral: Secondary | ICD-10-CM

## 2014-03-22 DIAGNOSIS — E11329 Type 2 diabetes mellitus with mild nonproliferative diabetic retinopathy without macular edema: Secondary | ICD-10-CM

## 2014-03-22 DIAGNOSIS — H3532 Exudative age-related macular degeneration: Secondary | ICD-10-CM

## 2014-03-22 DIAGNOSIS — E11319 Type 2 diabetes mellitus with unspecified diabetic retinopathy without macular edema: Secondary | ICD-10-CM

## 2014-03-22 DIAGNOSIS — H3531 Nonexudative age-related macular degeneration: Secondary | ICD-10-CM

## 2014-03-22 DIAGNOSIS — I10 Essential (primary) hypertension: Secondary | ICD-10-CM

## 2014-03-28 ENCOUNTER — Encounter (HOSPITAL_COMMUNITY): Payer: Self-pay | Admitting: Cardiology

## 2014-04-26 ENCOUNTER — Encounter (INDEPENDENT_AMBULATORY_CARE_PROVIDER_SITE_OTHER): Payer: Medicare HMO | Admitting: Ophthalmology

## 2014-04-26 DIAGNOSIS — E11329 Type 2 diabetes mellitus with mild nonproliferative diabetic retinopathy without macular edema: Secondary | ICD-10-CM

## 2014-04-26 DIAGNOSIS — I10 Essential (primary) hypertension: Secondary | ICD-10-CM

## 2014-04-26 DIAGNOSIS — H35033 Hypertensive retinopathy, bilateral: Secondary | ICD-10-CM

## 2014-04-26 DIAGNOSIS — E11319 Type 2 diabetes mellitus with unspecified diabetic retinopathy without macular edema: Secondary | ICD-10-CM

## 2014-04-26 DIAGNOSIS — H43813 Vitreous degeneration, bilateral: Secondary | ICD-10-CM

## 2014-04-26 DIAGNOSIS — H3531 Nonexudative age-related macular degeneration: Secondary | ICD-10-CM

## 2014-04-26 DIAGNOSIS — H3532 Exudative age-related macular degeneration: Secondary | ICD-10-CM

## 2014-05-04 NOTE — Progress Notes (Signed)
HPI: FU CAD; seen in the past for chest pain and near syncope. Previous cardiac catheterization in 1991 apparently okay per patient. Myoview performed in March of 2011. Ejection fraction was 70%. There was mild apical thinning but no ischemia. An echocardiogram was also performed in March of 2011. Ejection fraction was normal. There was mild aortic and mitral regurgitation. A CardioNet monitor was performed in November of 2011 related to syncope and revealed sinus with pacs. She was admitted 3/13 with chest pain. Lower extremity Dopplers were negative for DVT. She ruled out for myocardial infarction by enzymes. CT angiogram demonstrated no pulmonary embolism but did demonstrate coronary atherosclerosis. She also had anterior T-wave inversions on ECG. Given her presenting picture she was set up for cardiac catheterization. LHC 06/30/11: LAD diffuse 40-50%, circumflex irregular with less than 10% stenosis, mid RCA 50-60%, EF 55-65%. Medical therapy was recommended. Carotid dopplers 9/14 showed 40-59 R and 1-39 L; fu recommended one year. Since she was last seen, the patient denies any dyspnea on exertion, orthopnea, PND, pedal edema, palpitations, syncope or chest pain.   Current Outpatient Prescriptions  Medication Sig Dispense Refill  . allopurinol (ZYLOPRIM) 100 MG tablet Take 50 mg by mouth daily.     Marland Kitchen aspirin 81 MG tablet Take 81 mg by mouth daily.      . Bevacizumab (AVASTIN IV) Inject into the vein. Injection in left eye. Every 14 weeks.     . bisoprolol (ZEBETA) 5 MG tablet TAKE 1/2 TABLET (2.5 MG TOTAL) BY MOUTH 2 (TWO) TIMES DAILY. 90 tablet 0  . Calcium Carbonate (CALTRATE 600 PO) Take 1 tablet by mouth daily.    . diazepam (VALIUM) 5 MG tablet Take 5 mg by mouth as needed.      . Iron-Vitamins (GERITOL COMPLETE) TABS Take by mouth daily.      Marland Kitchen lisinopril (PRINIVIL,ZESTRIL) 10 MG tablet Take 1 tablet by mouth daily.  4  . moxifloxacin (VIGAMOX) 0.5 % ophthalmic solution 1 drop as  directed. Takes with injection in the eye    . Multiple Vitamins-Minerals (CENTRUM SILVER ULTRA MENS) TABS Take by mouth daily.      . Multiple Vitamins-Minerals (OCUVITE PRESERVISION) TABS Take by mouth daily.      . Nutritional Supplements (VITAMIN D MAINTENANCE PO) Take 1 tablet by mouth daily.    . pioglitazone (ACTOS) 15 MG tablet Take 2 tablets (30 mg total) by mouth daily. SEE COMMENT    . Polyethyl Glycol-Propyl Glycol (SYSTANE) 0.4-0.3 % SOLN Apply to eye as directed.      . simvastatin (ZOCOR) 40 MG tablet Take 40 mg by mouth daily.     No current facility-administered medications for this visit.     Past Medical History  Diagnosis Date  . Esophageal reflux   . Other and unspecified hyperlipidemia   . Unspecified essential hypertension   . Type II or unspecified type diabetes mellitus without mention of complication, not stated as uncontrolled   . Macular degeneration (senile) of retina, unspecified   . Osteoporosis   . Anemia     Felt secondary renal insuffiency, seen by Dr. Ralene Ok - Workup in the past has included normal iron, folate, B12, serum and urine protein electrophoresis as well as an ANA. Peripheral blood smear had been normal  . Pancytopenia 2010    Intermittent mild leukopenia and thrombocytopenia felt to be most likely due to immune dysregulation per Dr. Ralene Ok  . Dyslipidemia   . Gout   . Carotid  arterial disease     Last dopplers 12/1476 - RICA 29-56%, LICA 2-13% for recheck in 1 year  . Coronary artery disease     Moderate nonobstructive disease by cath 06/2011 (ruled out for MI/PE at that time)    Past Surgical History  Procedure Laterality Date  . Appendectomy      age 38  . Abdominal hysterectomy      cyst removal  . Abdominal cyst    . Left heart catheterization with coronary angiogram N/A 06/30/2011    Procedure: LEFT HEART CATHETERIZATION WITH CORONARY ANGIOGRAM;  Surgeon: Peter M Martinique, MD;  Location: [facility] CATH LAB;  Service: Cardiovascular;   Laterality: N/A;    History   Social History  . Marital Status: Widowed    Spouse Name: N/A    Number of Children: N/A  . Years of Education: N/A   Occupational History  . Not on file.   Social History Main Topics  . Smoking status: Never Smoker   . Smokeless tobacco: Never Used  . Alcohol Use: No  . Drug Use: No  . Sexual Activity: No   Other Topics Concern  . Not on file   Social History Narrative   Lives in [place] and lives alone, retired form [place] work.     ROS: no fevers or chills, productive cough, hemoptysis, dysphasia, odynophagia, melena, hematochezia, dysuria, hematuria, rash, seizure activity, orthopnea, PND, pedal edema, claudication. Remaining systems are negative.  Physical Exam: Well-developed well-nourished in no acute distress.  Skin is warm and dry.  HEENT is normal.  Neck is supple.  Chest is clear to auscultation with normal expansion.  Cardiovascular exam is regular rate and rhythm.  Abdominal exam nontender or distended. No masses palpated. Extremities show no edema. neuro grossly intact  ECG sinus bradycardia at a rate of 55. Right bundle branch block.

## 2014-05-06 ENCOUNTER — Encounter: Payer: Self-pay | Admitting: Cardiology

## 2014-05-06 ENCOUNTER — Encounter: Payer: Self-pay | Admitting: *Deleted

## 2014-05-06 ENCOUNTER — Ambulatory Visit (INDEPENDENT_AMBULATORY_CARE_PROVIDER_SITE_OTHER): Payer: Medicare HMO | Admitting: Cardiology

## 2014-05-06 VITALS — BP 132/80 | HR 55 | Ht 61.0 in | Wt 133.0 lb

## 2014-05-06 DIAGNOSIS — I679 Cerebrovascular disease, unspecified: Secondary | ICD-10-CM

## 2014-05-06 DIAGNOSIS — I10 Essential (primary) hypertension: Secondary | ICD-10-CM

## 2014-05-06 DIAGNOSIS — I2510 Atherosclerotic heart disease of native coronary artery without angina pectoris: Secondary | ICD-10-CM

## 2014-05-06 NOTE — Assessment & Plan Note (Signed)
Continue aspirin and statin. Schedule followup carotid Dopplers. 

## 2014-05-06 NOTE — Assessment & Plan Note (Signed)
Blood pressure controlled. Continue present medications. 

## 2014-05-06 NOTE — Assessment & Plan Note (Signed)
Continue aspirin and statin. 

## 2014-05-06 NOTE — Patient Instructions (Signed)

## 2014-05-06 NOTE — Assessment & Plan Note (Signed)
Continue statin. Lipids and liver monitored by primary care. 

## 2014-05-10 ENCOUNTER — Encounter (HOSPITAL_COMMUNITY): Payer: Medicare HMO

## 2014-05-17 ENCOUNTER — Ambulatory Visit (HOSPITAL_COMMUNITY)
Admission: RE | Admit: 2014-05-17 | Discharge: 2014-05-17 | Disposition: A | Discharge status: Home / Self Care | Payer: Medicare HMO | Source: Ambulatory Visit | Attending: Internal Medicine | Admitting: Internal Medicine

## 2014-05-17 DIAGNOSIS — I2510 Atherosclerotic heart disease of native coronary artery without angina pectoris: Secondary | ICD-10-CM | POA: Diagnosis not present

## 2014-05-17 DIAGNOSIS — I6523 Occlusion and stenosis of bilateral carotid arteries: Secondary | ICD-10-CM | POA: Insufficient documentation

## 2014-05-17 DIAGNOSIS — I679 Cerebrovascular disease, unspecified: Secondary | ICD-10-CM

## 2014-05-17 DIAGNOSIS — I779 Disorder of arteries and arterioles, unspecified: Secondary | ICD-10-CM

## 2014-05-17 NOTE — Progress Notes (Signed)
Carotid Duplex Completed. Stable bilateral ICA plaque with less than 40% narrowing. Oda Cogan, BS, RDMS, RVT

## 2014-05-31 ENCOUNTER — Encounter (INDEPENDENT_AMBULATORY_CARE_PROVIDER_SITE_OTHER): Payer: Medicare HMO | Admitting: Ophthalmology

## 2014-05-31 DIAGNOSIS — I10 Essential (primary) hypertension: Secondary | ICD-10-CM

## 2014-05-31 DIAGNOSIS — E11329 Type 2 diabetes mellitus with mild nonproliferative diabetic retinopathy without macular edema: Secondary | ICD-10-CM

## 2014-05-31 DIAGNOSIS — E11319 Type 2 diabetes mellitus with unspecified diabetic retinopathy without macular edema: Secondary | ICD-10-CM

## 2014-05-31 DIAGNOSIS — H35033 Hypertensive retinopathy, bilateral: Secondary | ICD-10-CM

## 2014-05-31 DIAGNOSIS — H43813 Vitreous degeneration, bilateral: Secondary | ICD-10-CM

## 2014-05-31 DIAGNOSIS — H3531 Nonexudative age-related macular degeneration: Secondary | ICD-10-CM

## 2014-05-31 DIAGNOSIS — H3532 Exudative age-related macular degeneration: Secondary | ICD-10-CM

## 2014-07-05 ENCOUNTER — Encounter (INDEPENDENT_AMBULATORY_CARE_PROVIDER_SITE_OTHER): Payer: Medicare HMO | Admitting: Ophthalmology

## 2014-07-05 DIAGNOSIS — H3531 Nonexudative age-related macular degeneration: Secondary | ICD-10-CM | POA: Diagnosis not present

## 2014-07-05 DIAGNOSIS — H35033 Hypertensive retinopathy, bilateral: Secondary | ICD-10-CM

## 2014-07-05 DIAGNOSIS — E11329 Type 2 diabetes mellitus with mild nonproliferative diabetic retinopathy without macular edema: Secondary | ICD-10-CM | POA: Diagnosis not present

## 2014-07-05 DIAGNOSIS — H43813 Vitreous degeneration, bilateral: Secondary | ICD-10-CM

## 2014-07-05 DIAGNOSIS — E11319 Type 2 diabetes mellitus with unspecified diabetic retinopathy without macular edema: Secondary | ICD-10-CM

## 2014-07-05 DIAGNOSIS — I10 Essential (primary) hypertension: Secondary | ICD-10-CM

## 2014-07-05 DIAGNOSIS — H3532 Exudative age-related macular degeneration: Secondary | ICD-10-CM | POA: Diagnosis not present

## 2014-08-02 ENCOUNTER — Encounter (INDEPENDENT_AMBULATORY_CARE_PROVIDER_SITE_OTHER): Payer: Medicare HMO | Admitting: Ophthalmology

## 2014-08-02 DIAGNOSIS — E11329 Type 2 diabetes mellitus with mild nonproliferative diabetic retinopathy without macular edema: Secondary | ICD-10-CM

## 2014-08-02 DIAGNOSIS — H35033 Hypertensive retinopathy, bilateral: Secondary | ICD-10-CM

## 2014-08-02 DIAGNOSIS — H3532 Exudative age-related macular degeneration: Secondary | ICD-10-CM | POA: Diagnosis not present

## 2014-08-02 DIAGNOSIS — I10 Essential (primary) hypertension: Secondary | ICD-10-CM | POA: Diagnosis not present

## 2014-08-02 DIAGNOSIS — E11319 Type 2 diabetes mellitus with unspecified diabetic retinopathy without macular edema: Secondary | ICD-10-CM

## 2014-08-02 DIAGNOSIS — H3531 Nonexudative age-related macular degeneration: Secondary | ICD-10-CM

## 2014-08-30 ENCOUNTER — Encounter (INDEPENDENT_AMBULATORY_CARE_PROVIDER_SITE_OTHER): Payer: Medicare HMO | Admitting: Ophthalmology

## 2014-08-30 DIAGNOSIS — H3532 Exudative age-related macular degeneration: Secondary | ICD-10-CM | POA: Diagnosis not present

## 2014-08-30 DIAGNOSIS — H43813 Vitreous degeneration, bilateral: Secondary | ICD-10-CM | POA: Diagnosis not present

## 2014-08-30 DIAGNOSIS — E11329 Type 2 diabetes mellitus with mild nonproliferative diabetic retinopathy without macular edema: Secondary | ICD-10-CM | POA: Diagnosis not present

## 2014-08-30 DIAGNOSIS — I10 Essential (primary) hypertension: Secondary | ICD-10-CM | POA: Diagnosis not present

## 2014-08-30 DIAGNOSIS — H3531 Nonexudative age-related macular degeneration: Secondary | ICD-10-CM

## 2014-08-30 DIAGNOSIS — H35033 Hypertensive retinopathy, bilateral: Secondary | ICD-10-CM | POA: Diagnosis not present

## 2014-08-30 DIAGNOSIS — E11319 Type 2 diabetes mellitus with unspecified diabetic retinopathy without macular edema: Secondary | ICD-10-CM

## 2014-09-27 ENCOUNTER — Encounter (INDEPENDENT_AMBULATORY_CARE_PROVIDER_SITE_OTHER): Payer: Medicare HMO | Admitting: Ophthalmology

## 2014-09-27 DIAGNOSIS — E11319 Type 2 diabetes mellitus with unspecified diabetic retinopathy without macular edema: Secondary | ICD-10-CM | POA: Diagnosis not present

## 2014-09-27 DIAGNOSIS — H35033 Hypertensive retinopathy, bilateral: Secondary | ICD-10-CM | POA: Diagnosis not present

## 2014-09-27 DIAGNOSIS — H43813 Vitreous degeneration, bilateral: Secondary | ICD-10-CM | POA: Diagnosis not present

## 2014-09-27 DIAGNOSIS — I10 Essential (primary) hypertension: Secondary | ICD-10-CM | POA: Diagnosis not present

## 2014-09-27 DIAGNOSIS — H3531 Nonexudative age-related macular degeneration: Secondary | ICD-10-CM

## 2014-09-27 DIAGNOSIS — E11329 Type 2 diabetes mellitus with mild nonproliferative diabetic retinopathy without macular edema: Secondary | ICD-10-CM

## 2014-09-27 DIAGNOSIS — H3532 Exudative age-related macular degeneration: Secondary | ICD-10-CM

## 2014-10-25 ENCOUNTER — Encounter (INDEPENDENT_AMBULATORY_CARE_PROVIDER_SITE_OTHER): Payer: Medicare HMO | Admitting: Ophthalmology

## 2014-10-25 DIAGNOSIS — H3532 Exudative age-related macular degeneration: Secondary | ICD-10-CM

## 2014-10-25 DIAGNOSIS — H3531 Nonexudative age-related macular degeneration: Secondary | ICD-10-CM | POA: Diagnosis not present

## 2014-10-25 DIAGNOSIS — E11319 Type 2 diabetes mellitus with unspecified diabetic retinopathy without macular edema: Secondary | ICD-10-CM

## 2014-10-25 DIAGNOSIS — I10 Essential (primary) hypertension: Secondary | ICD-10-CM

## 2014-10-25 DIAGNOSIS — H35033 Hypertensive retinopathy, bilateral: Secondary | ICD-10-CM | POA: Diagnosis not present

## 2014-10-25 DIAGNOSIS — E11329 Type 2 diabetes mellitus with mild nonproliferative diabetic retinopathy without macular edema: Secondary | ICD-10-CM

## 2014-11-25 ENCOUNTER — Encounter (INDEPENDENT_AMBULATORY_CARE_PROVIDER_SITE_OTHER): Payer: Medicare HMO | Admitting: Ophthalmology

## 2014-11-25 DIAGNOSIS — E11359 Type 2 diabetes mellitus with proliferative diabetic retinopathy without macular edema: Secondary | ICD-10-CM | POA: Diagnosis not present

## 2014-11-25 DIAGNOSIS — H3531 Nonexudative age-related macular degeneration: Secondary | ICD-10-CM | POA: Diagnosis not present

## 2014-11-25 DIAGNOSIS — H35033 Hypertensive retinopathy, bilateral: Secondary | ICD-10-CM | POA: Diagnosis not present

## 2014-11-25 DIAGNOSIS — H3532 Exudative age-related macular degeneration: Secondary | ICD-10-CM

## 2014-11-25 DIAGNOSIS — I10 Essential (primary) hypertension: Secondary | ICD-10-CM

## 2014-11-25 DIAGNOSIS — H43813 Vitreous degeneration, bilateral: Secondary | ICD-10-CM | POA: Diagnosis not present

## 2014-11-25 DIAGNOSIS — E11319 Type 2 diabetes mellitus with unspecified diabetic retinopathy without macular edema: Secondary | ICD-10-CM | POA: Diagnosis not present

## 2014-12-19 ENCOUNTER — Encounter (INDEPENDENT_AMBULATORY_CARE_PROVIDER_SITE_OTHER): Payer: Medicare HMO | Admitting: Ophthalmology

## 2014-12-19 DIAGNOSIS — H43813 Vitreous degeneration, bilateral: Secondary | ICD-10-CM | POA: Diagnosis not present

## 2014-12-19 DIAGNOSIS — E11329 Type 2 diabetes mellitus with mild nonproliferative diabetic retinopathy without macular edema: Secondary | ICD-10-CM

## 2014-12-19 DIAGNOSIS — E11311 Type 2 diabetes mellitus with unspecified diabetic retinopathy with macular edema: Secondary | ICD-10-CM

## 2014-12-19 DIAGNOSIS — H35033 Hypertensive retinopathy, bilateral: Secondary | ICD-10-CM | POA: Diagnosis not present

## 2014-12-19 DIAGNOSIS — I10 Essential (primary) hypertension: Secondary | ICD-10-CM

## 2014-12-19 DIAGNOSIS — H3531 Nonexudative age-related macular degeneration: Secondary | ICD-10-CM | POA: Diagnosis not present

## 2014-12-19 DIAGNOSIS — H3532 Exudative age-related macular degeneration: Secondary | ICD-10-CM

## 2015-01-16 ENCOUNTER — Encounter (INDEPENDENT_AMBULATORY_CARE_PROVIDER_SITE_OTHER): Payer: Medicare HMO | Admitting: Ophthalmology

## 2015-01-16 DIAGNOSIS — E11329 Type 2 diabetes mellitus with mild nonproliferative diabetic retinopathy without macular edema: Secondary | ICD-10-CM | POA: Diagnosis not present

## 2015-01-16 DIAGNOSIS — H3531 Nonexudative age-related macular degeneration: Secondary | ICD-10-CM | POA: Diagnosis not present

## 2015-01-16 DIAGNOSIS — H35033 Hypertensive retinopathy, bilateral: Secondary | ICD-10-CM | POA: Diagnosis not present

## 2015-01-16 DIAGNOSIS — H43813 Vitreous degeneration, bilateral: Secondary | ICD-10-CM | POA: Diagnosis not present

## 2015-01-16 DIAGNOSIS — E11319 Type 2 diabetes mellitus with unspecified diabetic retinopathy without macular edema: Secondary | ICD-10-CM | POA: Diagnosis not present

## 2015-01-16 DIAGNOSIS — H3532 Exudative age-related macular degeneration: Secondary | ICD-10-CM

## 2015-01-16 DIAGNOSIS — I10 Essential (primary) hypertension: Secondary | ICD-10-CM

## 2015-02-03 DIAGNOSIS — Z23 Encounter for immunization: Secondary | ICD-10-CM | POA: Diagnosis not present

## 2015-02-13 ENCOUNTER — Encounter (INDEPENDENT_AMBULATORY_CARE_PROVIDER_SITE_OTHER): Payer: Medicare HMO | Admitting: Ophthalmology

## 2015-02-13 DIAGNOSIS — I10 Essential (primary) hypertension: Secondary | ICD-10-CM | POA: Diagnosis not present

## 2015-02-13 DIAGNOSIS — H353231 Exudative age-related macular degeneration, bilateral, with active choroidal neovascularization: Secondary | ICD-10-CM | POA: Diagnosis not present

## 2015-02-13 DIAGNOSIS — E11311 Type 2 diabetes mellitus with unspecified diabetic retinopathy with macular edema: Secondary | ICD-10-CM | POA: Diagnosis not present

## 2015-02-13 DIAGNOSIS — E113293 Type 2 diabetes mellitus with mild nonproliferative diabetic retinopathy without macular edema, bilateral: Secondary | ICD-10-CM

## 2015-02-13 DIAGNOSIS — H43813 Vitreous degeneration, bilateral: Secondary | ICD-10-CM | POA: Diagnosis not present

## 2015-02-13 DIAGNOSIS — H35033 Hypertensive retinopathy, bilateral: Secondary | ICD-10-CM | POA: Diagnosis not present

## 2015-03-06 DIAGNOSIS — N390 Urinary tract infection, site not specified: Secondary | ICD-10-CM | POA: Diagnosis not present

## 2015-03-06 DIAGNOSIS — I10 Essential (primary) hypertension: Secondary | ICD-10-CM | POA: Diagnosis not present

## 2015-03-06 DIAGNOSIS — E7800 Pure hypercholesterolemia, unspecified: Secondary | ICD-10-CM | POA: Diagnosis not present

## 2015-03-11 DIAGNOSIS — D61818 Other pancytopenia: Secondary | ICD-10-CM | POA: Diagnosis not present

## 2015-03-11 DIAGNOSIS — I10 Essential (primary) hypertension: Secondary | ICD-10-CM | POA: Diagnosis not present

## 2015-03-11 DIAGNOSIS — L299 Pruritus, unspecified: Secondary | ICD-10-CM | POA: Diagnosis not present

## 2015-03-11 DIAGNOSIS — E7800 Pure hypercholesterolemia, unspecified: Secondary | ICD-10-CM | POA: Diagnosis not present

## 2015-03-12 ENCOUNTER — Encounter (INDEPENDENT_AMBULATORY_CARE_PROVIDER_SITE_OTHER): Payer: Medicare HMO | Admitting: Ophthalmology

## 2015-03-12 DIAGNOSIS — H43813 Vitreous degeneration, bilateral: Secondary | ICD-10-CM

## 2015-03-12 DIAGNOSIS — H35033 Hypertensive retinopathy, bilateral: Secondary | ICD-10-CM | POA: Diagnosis not present

## 2015-03-12 DIAGNOSIS — E113313 Type 2 diabetes mellitus with moderate nonproliferative diabetic retinopathy with macular edema, bilateral: Secondary | ICD-10-CM

## 2015-03-12 DIAGNOSIS — H353131 Nonexudative age-related macular degeneration, bilateral, early dry stage: Secondary | ICD-10-CM | POA: Diagnosis not present

## 2015-03-12 DIAGNOSIS — I10 Essential (primary) hypertension: Secondary | ICD-10-CM | POA: Diagnosis not present

## 2015-03-12 DIAGNOSIS — E11311 Type 2 diabetes mellitus with unspecified diabetic retinopathy with macular edema: Secondary | ICD-10-CM

## 2015-03-20 ENCOUNTER — Encounter: Payer: Self-pay | Admitting: Cardiology

## 2015-04-09 ENCOUNTER — Encounter (INDEPENDENT_AMBULATORY_CARE_PROVIDER_SITE_OTHER): Payer: Medicare HMO | Admitting: Ophthalmology

## 2015-04-09 DIAGNOSIS — E11311 Type 2 diabetes mellitus with unspecified diabetic retinopathy with macular edema: Secondary | ICD-10-CM

## 2015-04-09 DIAGNOSIS — H35033 Hypertensive retinopathy, bilateral: Secondary | ICD-10-CM | POA: Diagnosis not present

## 2015-04-09 DIAGNOSIS — H353231 Exudative age-related macular degeneration, bilateral, with active choroidal neovascularization: Secondary | ICD-10-CM | POA: Diagnosis not present

## 2015-04-09 DIAGNOSIS — H43813 Vitreous degeneration, bilateral: Secondary | ICD-10-CM | POA: Diagnosis not present

## 2015-04-09 DIAGNOSIS — E113213 Type 2 diabetes mellitus with mild nonproliferative diabetic retinopathy with macular edema, bilateral: Secondary | ICD-10-CM | POA: Diagnosis not present

## 2015-04-09 DIAGNOSIS — I10 Essential (primary) hypertension: Secondary | ICD-10-CM

## 2015-05-07 ENCOUNTER — Encounter (INDEPENDENT_AMBULATORY_CARE_PROVIDER_SITE_OTHER): Payer: Medicare HMO | Admitting: Ophthalmology

## 2015-05-07 DIAGNOSIS — I10 Essential (primary) hypertension: Secondary | ICD-10-CM

## 2015-05-07 DIAGNOSIS — H35033 Hypertensive retinopathy, bilateral: Secondary | ICD-10-CM

## 2015-05-07 DIAGNOSIS — E11311 Type 2 diabetes mellitus with unspecified diabetic retinopathy with macular edema: Secondary | ICD-10-CM

## 2015-05-07 DIAGNOSIS — H43813 Vitreous degeneration, bilateral: Secondary | ICD-10-CM | POA: Diagnosis not present

## 2015-05-07 DIAGNOSIS — E113213 Type 2 diabetes mellitus with mild nonproliferative diabetic retinopathy with macular edema, bilateral: Secondary | ICD-10-CM | POA: Diagnosis not present

## 2015-05-07 DIAGNOSIS — H353231 Exudative age-related macular degeneration, bilateral, with active choroidal neovascularization: Secondary | ICD-10-CM

## 2015-05-13 ENCOUNTER — Ambulatory Visit: Payer: Medicare HMO | Admitting: Cardiology

## 2015-05-19 NOTE — Progress Notes (Signed)
HPI: FU CAD; seen in the past for chest pain and near syncope. Previous cardiac catheterization in 1991 apparently okay per patient. Myoview performed in March of 2011. Ejection fraction was 70%. There was mild apical thinning but no ischemia. An echocardiogram was also performed in March of 2011. Ejection fraction was normal. There was mild aortic and mitral regurgitation. A CardioNet monitor was performed in November of 2011 related to syncope and revealed sinus with pacs. She was admitted 3/13 with chest pain. Lower extremity Dopplers were negative for DVT. She ruled out for myocardial infarction by enzymes. CT angiogram demonstrated no pulmonary embolism but did demonstrate coronary atherosclerosis. She also had anterior T-wave inversions on ECG. Given her presenting picture she was set up for cardiac catheterization. LHC 06/30/11: LAD diffuse 40-50%, circumflex irregular with less than 10% stenosis, mid RCA 50-60%, EF 55-65%. Medical therapy was recommended. Carotid dopplers January 2016 showed no significant stenosis. Since she was last seen, There is no dyspnea, chest pain, palpitations or syncope.  Current Outpatient Prescriptions  Medication Sig Dispense Refill  . allopurinol (ZYLOPRIM) 100 MG tablet Take 50 mg by mouth daily.     Marland Kitchen aspirin 81 MG tablet Take 81 mg by mouth daily.      . Bevacizumab (AVASTIN IV) Inject into the vein. Injection in left eye. Every 14 weeks.     . bisoprolol (ZEBETA) 5 MG tablet TAKE 1/2 TABLET (2.5 MG TOTAL) BY MOUTH 2 (TWO) TIMES DAILY. 90 tablet 0  . Calcium Carbonate (CALTRATE 600 PO) Take 1 tablet by mouth daily.    . diazepam (VALIUM) 5 MG tablet Take 5 mg by mouth as needed.      . Iron-Vitamins (GERITOL COMPLETE) TABS Take by mouth daily.      Marland Kitchen lisinopril (PRINIVIL,ZESTRIL) 10 MG tablet Take 1 tablet by mouth daily.  4  . moxifloxacin (VIGAMOX) 0.5 % ophthalmic solution 1 drop as directed. Takes with injection in the eye    . Multiple  Vitamins-Minerals (CENTRUM SILVER ULTRA MENS) TABS Take by mouth daily.      . Multiple Vitamins-Minerals (OCUVITE PRESERVISION) TABS Take by mouth daily.      . Nutritional Supplements (VITAMIN D MAINTENANCE PO) Take 1 tablet by mouth daily.    . pioglitazone (ACTOS) 15 MG tablet Take 2 tablets (30 mg total) by mouth daily. SEE COMMENT    . Polyethyl Glycol-Propyl Glycol (SYSTANE) 0.4-0.3 % SOLN Apply to eye as directed.      . simvastatin (ZOCOR) 40 MG tablet Take 40 mg by mouth daily.     No current facility-administered medications for this visit.     Past Medical History  Diagnosis Date  . Esophageal reflux   . Other and unspecified hyperlipidemia   . Unspecified essential hypertension   . Type II or unspecified type diabetes mellitus without mention of complication, not stated as uncontrolled   . Macular degeneration (senile) of retina, unspecified   . Osteoporosis   . Anemia     Felt secondary renal insuffiency, seen by Dr. Ralene Ok - Workup in the past has included normal iron, folate, B12, serum and urine protein electrophoresis as well as an ANA. Peripheral blood smear had been normal  . Pancytopenia 2010    Intermittent mild leukopenia and thrombocytopenia felt to be most likely due to immune dysregulation per Dr. Ralene Ok  . Dyslipidemia   . Gout   . Carotid arterial disease     Last dopplers XX123456 - RICA 123456, LICA  0-39% for recheck in 1 year  . Coronary artery disease     Moderate nonobstructive disease by cath 06/2011 (ruled out for MI/PE at that time)    Past Surgical History  Procedure Laterality Date  . Appendectomy      age 31  . Abdominal hysterectomy      cyst removal  . Abdominal cyst    . Left heart catheterization with coronary angiogram N/A 06/30/2011    Procedure: LEFT HEART CATHETERIZATION WITH CORONARY ANGIOGRAM;  Surgeon: Peter M Martinique, MD;  Location: [facility] CATH LAB;  Service: Cardiovascular;  Laterality: N/A;    Social History   Social History    . Marital Status: Widowed    Spouse Name: N/A  . Number of Children: N/A  . Years of Education: N/A   Occupational History  . Not on file.   Social History Main Topics  . Smoking status: Never Smoker   . Smokeless tobacco: Never Used  . Alcohol Use: No  . Drug Use: No  . Sexual Activity: No   Other Topics Concern  . Not on file   Social History Narrative   Lives in [place] and lives alone, retired form [employer] work.     Family History  Problem Relation Age of Onset  . Heart attack Father     in his 37's.  . Coronary artery disease Other     younger siblings  . Cancer Maternal Aunt     GYN    ROS: Fatigue and hip pain but no fevers or chills, productive cough, hemoptysis, dysphasia, odynophagia, melena, hematochezia, dysuria, hematuria, rash, seizure activity, orthopnea, PND, pedal edema, claudication. Remaining systems are negative.  Physical Exam: Well-developed well-nourished in no acute distress.  Skin is warm and dry.  HEENT is normal.  Neck is supple.  Chest is clear to auscultation with normal expansion.  Cardiovascular exam is regular rate and rhythm.  Abdominal exam nontender or distended. No masses palpated. Extremities show no edema. neuro grossly intact  ECG Sinus bradycardia at a rate of 52.Right bundle branch block. Low voltage.

## 2015-05-20 ENCOUNTER — Encounter: Payer: Self-pay | Admitting: Cardiology

## 2015-05-20 ENCOUNTER — Ambulatory Visit (INDEPENDENT_AMBULATORY_CARE_PROVIDER_SITE_OTHER): Payer: Medicare HMO | Admitting: Cardiology

## 2015-05-20 VITALS — BP 130/60 | HR 52 | Ht 61.0 in | Wt 128.1 lb

## 2015-05-20 DIAGNOSIS — I2510 Atherosclerotic heart disease of native coronary artery without angina pectoris: Secondary | ICD-10-CM

## 2015-05-20 NOTE — Patient Instructions (Addendum)
Medication Instructions:  Your physician recommends that you continue on your current medications as directed. Please refer to the Current Medication list given to you today.  Labwork:   Testing/Procedures:   Follow-Up: Your physician wants you to follow-up in: [place] will receive a reminder letter in the mail two months in advance. If you don't receive a letter, please call our office to schedule the follow-up appointment.  Any Other Special Instructions Will Be Listed Below (If Applicable).     If you need a refill on your cardiac medications before your next appointment, please call your pharmacy.

## 2015-05-20 NOTE — Assessment & Plan Note (Signed)
Continue aspirin and statin. 

## 2015-05-20 NOTE — Assessment & Plan Note (Signed)
Blood pressure controlled. Continue present medications. 

## 2015-05-20 NOTE — Assessment & Plan Note (Signed)
No significant obstruction almost recent carotid Dopplers.

## 2015-05-20 NOTE — Assessment & Plan Note (Signed)
Continue statin. 

## 2015-06-04 ENCOUNTER — Encounter (INDEPENDENT_AMBULATORY_CARE_PROVIDER_SITE_OTHER): Payer: Medicare HMO | Admitting: Ophthalmology

## 2015-06-04 DIAGNOSIS — H353231 Exudative age-related macular degeneration, bilateral, with active choroidal neovascularization: Secondary | ICD-10-CM | POA: Diagnosis not present

## 2015-06-04 DIAGNOSIS — E11311 Type 2 diabetes mellitus with unspecified diabetic retinopathy with macular edema: Secondary | ICD-10-CM | POA: Diagnosis not present

## 2015-06-04 DIAGNOSIS — H35033 Hypertensive retinopathy, bilateral: Secondary | ICD-10-CM | POA: Diagnosis not present

## 2015-06-04 DIAGNOSIS — E113312 Type 2 diabetes mellitus with moderate nonproliferative diabetic retinopathy with macular edema, left eye: Secondary | ICD-10-CM

## 2015-06-04 DIAGNOSIS — I10 Essential (primary) hypertension: Secondary | ICD-10-CM

## 2015-06-04 DIAGNOSIS — H43813 Vitreous degeneration, bilateral: Secondary | ICD-10-CM | POA: Diagnosis not present

## 2015-06-04 DIAGNOSIS — E113211 Type 2 diabetes mellitus with mild nonproliferative diabetic retinopathy with macular edema, right eye: Secondary | ICD-10-CM | POA: Diagnosis not present

## 2015-06-13 DIAGNOSIS — N3091 Cystitis, unspecified with hematuria: Secondary | ICD-10-CM | POA: Diagnosis not present

## 2015-06-13 DIAGNOSIS — N3001 Acute cystitis with hematuria: Secondary | ICD-10-CM | POA: Diagnosis not present

## 2015-06-16 DIAGNOSIS — R69 Illness, unspecified: Secondary | ICD-10-CM | POA: Diagnosis not present

## 2015-06-17 DIAGNOSIS — L821 Other seborrheic keratosis: Secondary | ICD-10-CM | POA: Diagnosis not present

## 2015-06-17 DIAGNOSIS — C4372 Malignant melanoma of left lower limb, including hip: Secondary | ICD-10-CM | POA: Diagnosis not present

## 2015-07-02 ENCOUNTER — Encounter (INDEPENDENT_AMBULATORY_CARE_PROVIDER_SITE_OTHER): Payer: Medicare HMO | Admitting: Ophthalmology

## 2015-07-02 DIAGNOSIS — E11311 Type 2 diabetes mellitus with unspecified diabetic retinopathy with macular edema: Secondary | ICD-10-CM | POA: Diagnosis not present

## 2015-07-02 DIAGNOSIS — E113312 Type 2 diabetes mellitus with moderate nonproliferative diabetic retinopathy with macular edema, left eye: Secondary | ICD-10-CM | POA: Diagnosis not present

## 2015-07-02 DIAGNOSIS — H35033 Hypertensive retinopathy, bilateral: Secondary | ICD-10-CM

## 2015-07-02 DIAGNOSIS — I10 Essential (primary) hypertension: Secondary | ICD-10-CM

## 2015-07-02 DIAGNOSIS — E113211 Type 2 diabetes mellitus with mild nonproliferative diabetic retinopathy with macular edema, right eye: Secondary | ICD-10-CM

## 2015-07-02 DIAGNOSIS — H43813 Vitreous degeneration, bilateral: Secondary | ICD-10-CM | POA: Diagnosis not present

## 2015-07-02 DIAGNOSIS — H353231 Exudative age-related macular degeneration, bilateral, with active choroidal neovascularization: Secondary | ICD-10-CM | POA: Diagnosis not present

## 2015-07-25 ENCOUNTER — Encounter (INDEPENDENT_AMBULATORY_CARE_PROVIDER_SITE_OTHER): Payer: Medicare HMO | Admitting: Ophthalmology

## 2015-07-25 DIAGNOSIS — I10 Essential (primary) hypertension: Secondary | ICD-10-CM | POA: Diagnosis not present

## 2015-07-25 DIAGNOSIS — E113212 Type 2 diabetes mellitus with mild nonproliferative diabetic retinopathy with macular edema, left eye: Secondary | ICD-10-CM

## 2015-07-25 DIAGNOSIS — E113291 Type 2 diabetes mellitus with mild nonproliferative diabetic retinopathy without macular edema, right eye: Secondary | ICD-10-CM | POA: Diagnosis not present

## 2015-07-25 DIAGNOSIS — H35033 Hypertensive retinopathy, bilateral: Secondary | ICD-10-CM | POA: Diagnosis not present

## 2015-07-25 DIAGNOSIS — H43813 Vitreous degeneration, bilateral: Secondary | ICD-10-CM | POA: Diagnosis not present

## 2015-07-25 DIAGNOSIS — E11311 Type 2 diabetes mellitus with unspecified diabetic retinopathy with macular edema: Secondary | ICD-10-CM | POA: Diagnosis not present

## 2015-07-25 DIAGNOSIS — H353231 Exudative age-related macular degeneration, bilateral, with active choroidal neovascularization: Secondary | ICD-10-CM

## 2015-08-26 ENCOUNTER — Encounter (INDEPENDENT_AMBULATORY_CARE_PROVIDER_SITE_OTHER): Payer: Medicare HMO | Admitting: Ophthalmology

## 2015-08-26 DIAGNOSIS — E113213 Type 2 diabetes mellitus with mild nonproliferative diabetic retinopathy with macular edema, bilateral: Secondary | ICD-10-CM

## 2015-08-26 DIAGNOSIS — H353231 Exudative age-related macular degeneration, bilateral, with active choroidal neovascularization: Secondary | ICD-10-CM

## 2015-08-26 DIAGNOSIS — H43813 Vitreous degeneration, bilateral: Secondary | ICD-10-CM

## 2015-08-26 DIAGNOSIS — H35033 Hypertensive retinopathy, bilateral: Secondary | ICD-10-CM

## 2015-08-26 DIAGNOSIS — I10 Essential (primary) hypertension: Secondary | ICD-10-CM | POA: Diagnosis not present

## 2015-08-26 DIAGNOSIS — E11311 Type 2 diabetes mellitus with unspecified diabetic retinopathy with macular edema: Secondary | ICD-10-CM | POA: Diagnosis not present

## 2015-09-03 DIAGNOSIS — D61818 Other pancytopenia: Secondary | ICD-10-CM | POA: Diagnosis not present

## 2015-09-03 DIAGNOSIS — I10 Essential (primary) hypertension: Secondary | ICD-10-CM | POA: Diagnosis not present

## 2015-09-03 DIAGNOSIS — R739 Hyperglycemia, unspecified: Secondary | ICD-10-CM | POA: Diagnosis not present

## 2015-09-03 DIAGNOSIS — M109 Gout, unspecified: Secondary | ICD-10-CM | POA: Diagnosis not present

## 2015-09-08 DIAGNOSIS — I10 Essential (primary) hypertension: Secondary | ICD-10-CM | POA: Diagnosis not present

## 2015-09-08 DIAGNOSIS — R739 Hyperglycemia, unspecified: Secondary | ICD-10-CM | POA: Diagnosis not present

## 2015-09-08 DIAGNOSIS — I2510 Atherosclerotic heart disease of native coronary artery without angina pectoris: Secondary | ICD-10-CM | POA: Diagnosis not present

## 2015-09-08 DIAGNOSIS — D61818 Other pancytopenia: Secondary | ICD-10-CM | POA: Diagnosis not present

## 2015-09-23 ENCOUNTER — Encounter (INDEPENDENT_AMBULATORY_CARE_PROVIDER_SITE_OTHER): Payer: Medicare HMO | Admitting: Ophthalmology

## 2015-09-23 DIAGNOSIS — I10 Essential (primary) hypertension: Secondary | ICD-10-CM | POA: Diagnosis not present

## 2015-09-23 DIAGNOSIS — H35033 Hypertensive retinopathy, bilateral: Secondary | ICD-10-CM | POA: Diagnosis not present

## 2015-09-23 DIAGNOSIS — H353231 Exudative age-related macular degeneration, bilateral, with active choroidal neovascularization: Secondary | ICD-10-CM

## 2015-09-23 DIAGNOSIS — H43813 Vitreous degeneration, bilateral: Secondary | ICD-10-CM | POA: Diagnosis not present

## 2015-09-23 DIAGNOSIS — E113213 Type 2 diabetes mellitus with mild nonproliferative diabetic retinopathy with macular edema, bilateral: Secondary | ICD-10-CM | POA: Diagnosis not present

## 2015-10-16 ENCOUNTER — Encounter (INDEPENDENT_AMBULATORY_CARE_PROVIDER_SITE_OTHER): Payer: Medicare HMO | Admitting: Ophthalmology

## 2015-10-16 DIAGNOSIS — H43813 Vitreous degeneration, bilateral: Secondary | ICD-10-CM | POA: Diagnosis not present

## 2015-10-16 DIAGNOSIS — H353231 Exudative age-related macular degeneration, bilateral, with active choroidal neovascularization: Secondary | ICD-10-CM | POA: Diagnosis not present

## 2015-10-16 DIAGNOSIS — H35033 Hypertensive retinopathy, bilateral: Secondary | ICD-10-CM

## 2015-10-16 DIAGNOSIS — I10 Essential (primary) hypertension: Secondary | ICD-10-CM | POA: Diagnosis not present

## 2015-10-16 DIAGNOSIS — E113213 Type 2 diabetes mellitus with mild nonproliferative diabetic retinopathy with macular edema, bilateral: Secondary | ICD-10-CM

## 2015-10-16 DIAGNOSIS — E11311 Type 2 diabetes mellitus with unspecified diabetic retinopathy with macular edema: Secondary | ICD-10-CM

## 2015-11-06 DIAGNOSIS — R69 Illness, unspecified: Secondary | ICD-10-CM | POA: Diagnosis not present

## 2015-11-13 ENCOUNTER — Encounter (INDEPENDENT_AMBULATORY_CARE_PROVIDER_SITE_OTHER): Payer: Medicare HMO | Admitting: Ophthalmology

## 2015-11-13 DIAGNOSIS — I10 Essential (primary) hypertension: Secondary | ICD-10-CM

## 2015-11-13 DIAGNOSIS — E11311 Type 2 diabetes mellitus with unspecified diabetic retinopathy with macular edema: Secondary | ICD-10-CM

## 2015-11-13 DIAGNOSIS — H35033 Hypertensive retinopathy, bilateral: Secondary | ICD-10-CM | POA: Diagnosis not present

## 2015-11-13 DIAGNOSIS — H43813 Vitreous degeneration, bilateral: Secondary | ICD-10-CM | POA: Diagnosis not present

## 2015-11-13 DIAGNOSIS — H353231 Exudative age-related macular degeneration, bilateral, with active choroidal neovascularization: Secondary | ICD-10-CM | POA: Diagnosis not present

## 2015-11-13 DIAGNOSIS — E113313 Type 2 diabetes mellitus with moderate nonproliferative diabetic retinopathy with macular edema, bilateral: Secondary | ICD-10-CM

## 2015-12-11 ENCOUNTER — Encounter (INDEPENDENT_AMBULATORY_CARE_PROVIDER_SITE_OTHER): Payer: Medicare HMO | Admitting: Ophthalmology

## 2015-12-11 DIAGNOSIS — H43813 Vitreous degeneration, bilateral: Secondary | ICD-10-CM

## 2015-12-11 DIAGNOSIS — E113313 Type 2 diabetes mellitus with moderate nonproliferative diabetic retinopathy with macular edema, bilateral: Secondary | ICD-10-CM | POA: Diagnosis not present

## 2015-12-11 DIAGNOSIS — E11311 Type 2 diabetes mellitus with unspecified diabetic retinopathy with macular edema: Secondary | ICD-10-CM | POA: Diagnosis not present

## 2015-12-11 DIAGNOSIS — I10 Essential (primary) hypertension: Secondary | ICD-10-CM | POA: Diagnosis not present

## 2015-12-11 DIAGNOSIS — H35033 Hypertensive retinopathy, bilateral: Secondary | ICD-10-CM

## 2015-12-11 DIAGNOSIS — H353231 Exudative age-related macular degeneration, bilateral, with active choroidal neovascularization: Secondary | ICD-10-CM | POA: Diagnosis not present

## 2015-12-15 DIAGNOSIS — L821 Other seborrheic keratosis: Secondary | ICD-10-CM | POA: Diagnosis not present

## 2015-12-15 DIAGNOSIS — L578 Other skin changes due to chronic exposure to nonionizing radiation: Secondary | ICD-10-CM | POA: Diagnosis not present

## 2015-12-15 DIAGNOSIS — L570 Actinic keratosis: Secondary | ICD-10-CM | POA: Diagnosis not present

## 2015-12-15 DIAGNOSIS — C4372 Malignant melanoma of left lower limb, including hip: Secondary | ICD-10-CM | POA: Diagnosis not present

## 2016-01-01 DIAGNOSIS — J029 Acute pharyngitis, unspecified: Secondary | ICD-10-CM | POA: Diagnosis not present

## 2016-01-01 DIAGNOSIS — H6123 Impacted cerumen, bilateral: Secondary | ICD-10-CM | POA: Diagnosis not present

## 2016-01-08 ENCOUNTER — Encounter (INDEPENDENT_AMBULATORY_CARE_PROVIDER_SITE_OTHER): Payer: Medicare HMO | Admitting: Ophthalmology

## 2016-01-08 DIAGNOSIS — E113213 Type 2 diabetes mellitus with mild nonproliferative diabetic retinopathy with macular edema, bilateral: Secondary | ICD-10-CM | POA: Diagnosis not present

## 2016-01-08 DIAGNOSIS — H35033 Hypertensive retinopathy, bilateral: Secondary | ICD-10-CM | POA: Diagnosis not present

## 2016-01-08 DIAGNOSIS — H353231 Exudative age-related macular degeneration, bilateral, with active choroidal neovascularization: Secondary | ICD-10-CM

## 2016-01-08 DIAGNOSIS — E11311 Type 2 diabetes mellitus with unspecified diabetic retinopathy with macular edema: Secondary | ICD-10-CM

## 2016-01-08 DIAGNOSIS — I10 Essential (primary) hypertension: Secondary | ICD-10-CM

## 2016-01-19 DIAGNOSIS — H60539 Acute contact otitis externa, unspecified ear: Secondary | ICD-10-CM | POA: Diagnosis not present

## 2016-02-02 DIAGNOSIS — R69 Illness, unspecified: Secondary | ICD-10-CM | POA: Diagnosis not present

## 2016-02-03 DIAGNOSIS — R69 Illness, unspecified: Secondary | ICD-10-CM | POA: Diagnosis not present

## 2016-02-05 ENCOUNTER — Encounter (INDEPENDENT_AMBULATORY_CARE_PROVIDER_SITE_OTHER): Payer: Medicare HMO | Admitting: Ophthalmology

## 2016-02-05 DIAGNOSIS — H35033 Hypertensive retinopathy, bilateral: Secondary | ICD-10-CM | POA: Diagnosis not present

## 2016-02-05 DIAGNOSIS — H353231 Exudative age-related macular degeneration, bilateral, with active choroidal neovascularization: Secondary | ICD-10-CM

## 2016-02-05 DIAGNOSIS — E113213 Type 2 diabetes mellitus with mild nonproliferative diabetic retinopathy with macular edema, bilateral: Secondary | ICD-10-CM | POA: Diagnosis not present

## 2016-02-05 DIAGNOSIS — H43813 Vitreous degeneration, bilateral: Secondary | ICD-10-CM

## 2016-02-05 DIAGNOSIS — I10 Essential (primary) hypertension: Secondary | ICD-10-CM | POA: Diagnosis not present

## 2016-02-05 DIAGNOSIS — E11311 Type 2 diabetes mellitus with unspecified diabetic retinopathy with macular edema: Secondary | ICD-10-CM

## 2016-02-27 DIAGNOSIS — R69 Illness, unspecified: Secondary | ICD-10-CM | POA: Diagnosis not present

## 2016-03-05 ENCOUNTER — Encounter (INDEPENDENT_AMBULATORY_CARE_PROVIDER_SITE_OTHER): Payer: Medicare HMO | Admitting: Ophthalmology

## 2016-03-05 DIAGNOSIS — E113293 Type 2 diabetes mellitus with mild nonproliferative diabetic retinopathy without macular edema, bilateral: Secondary | ICD-10-CM | POA: Diagnosis not present

## 2016-03-05 DIAGNOSIS — H43813 Vitreous degeneration, bilateral: Secondary | ICD-10-CM | POA: Diagnosis not present

## 2016-03-05 DIAGNOSIS — E11319 Type 2 diabetes mellitus with unspecified diabetic retinopathy without macular edema: Secondary | ICD-10-CM

## 2016-03-05 DIAGNOSIS — H353231 Exudative age-related macular degeneration, bilateral, with active choroidal neovascularization: Secondary | ICD-10-CM | POA: Diagnosis not present

## 2016-03-05 DIAGNOSIS — H35033 Hypertensive retinopathy, bilateral: Secondary | ICD-10-CM

## 2016-03-05 DIAGNOSIS — I10 Essential (primary) hypertension: Secondary | ICD-10-CM | POA: Diagnosis not present

## 2016-03-09 DIAGNOSIS — D61818 Other pancytopenia: Secondary | ICD-10-CM | POA: Diagnosis not present

## 2016-03-09 DIAGNOSIS — I10 Essential (primary) hypertension: Secondary | ICD-10-CM | POA: Diagnosis not present

## 2016-03-09 DIAGNOSIS — R739 Hyperglycemia, unspecified: Secondary | ICD-10-CM | POA: Diagnosis not present

## 2016-03-09 DIAGNOSIS — M109 Gout, unspecified: Secondary | ICD-10-CM | POA: Diagnosis not present

## 2016-03-16 DIAGNOSIS — I10 Essential (primary) hypertension: Secondary | ICD-10-CM | POA: Diagnosis not present

## 2016-03-16 DIAGNOSIS — Z780 Asymptomatic menopausal state: Secondary | ICD-10-CM | POA: Diagnosis not present

## 2016-03-16 DIAGNOSIS — Z0000 Encounter for general adult medical examination without abnormal findings: Secondary | ICD-10-CM | POA: Diagnosis not present

## 2016-03-16 DIAGNOSIS — R739 Hyperglycemia, unspecified: Secondary | ICD-10-CM | POA: Diagnosis not present

## 2016-03-16 DIAGNOSIS — E7800 Pure hypercholesterolemia, unspecified: Secondary | ICD-10-CM | POA: Diagnosis not present

## 2016-04-02 ENCOUNTER — Encounter (INDEPENDENT_AMBULATORY_CARE_PROVIDER_SITE_OTHER): Payer: Medicare HMO | Admitting: Ophthalmology

## 2016-04-02 DIAGNOSIS — E11311 Type 2 diabetes mellitus with unspecified diabetic retinopathy with macular edema: Secondary | ICD-10-CM

## 2016-04-02 DIAGNOSIS — E113212 Type 2 diabetes mellitus with mild nonproliferative diabetic retinopathy with macular edema, left eye: Secondary | ICD-10-CM

## 2016-04-02 DIAGNOSIS — H353231 Exudative age-related macular degeneration, bilateral, with active choroidal neovascularization: Secondary | ICD-10-CM

## 2016-04-02 DIAGNOSIS — H35033 Hypertensive retinopathy, bilateral: Secondary | ICD-10-CM | POA: Diagnosis not present

## 2016-04-02 DIAGNOSIS — E113312 Type 2 diabetes mellitus with moderate nonproliferative diabetic retinopathy with macular edema, left eye: Secondary | ICD-10-CM

## 2016-04-02 DIAGNOSIS — I10 Essential (primary) hypertension: Secondary | ICD-10-CM | POA: Diagnosis not present

## 2016-04-02 DIAGNOSIS — H43813 Vitreous degeneration, bilateral: Secondary | ICD-10-CM

## 2016-04-30 ENCOUNTER — Encounter (INDEPENDENT_AMBULATORY_CARE_PROVIDER_SITE_OTHER): Payer: Medicare HMO | Admitting: Ophthalmology

## 2016-04-30 DIAGNOSIS — H43813 Vitreous degeneration, bilateral: Secondary | ICD-10-CM

## 2016-04-30 DIAGNOSIS — E11311 Type 2 diabetes mellitus with unspecified diabetic retinopathy with macular edema: Secondary | ICD-10-CM

## 2016-04-30 DIAGNOSIS — I10 Essential (primary) hypertension: Secondary | ICD-10-CM

## 2016-04-30 DIAGNOSIS — H35033 Hypertensive retinopathy, bilateral: Secondary | ICD-10-CM

## 2016-04-30 DIAGNOSIS — E113313 Type 2 diabetes mellitus with moderate nonproliferative diabetic retinopathy with macular edema, bilateral: Secondary | ICD-10-CM | POA: Diagnosis not present

## 2016-04-30 DIAGNOSIS — H353231 Exudative age-related macular degeneration, bilateral, with active choroidal neovascularization: Secondary | ICD-10-CM | POA: Diagnosis not present

## 2016-05-04 DIAGNOSIS — R69 Illness, unspecified: Secondary | ICD-10-CM | POA: Diagnosis not present

## 2016-05-10 NOTE — Progress Notes (Signed)
HPI: FU CAD; seen in the past for chest pain and near syncope. An echocardiogram performed in March of 2011 showed normal EF. There was mild aortic and mitral regurgitation. A CardioNet monitor was performed in November of 2011 related to syncope and revealed sinus with pacs. LHC 06/30/11: LAD diffuse 40-50%, circumflex irregular with less than 10% stenosis, mid RCA 50-60%, EF 55-65%. Medical therapy was recommended. Carotid dopplers January 2016 showed no significant stenosis. Since she was last seen, the patient has dyspnea with more extreme activities but not with routine activities. It is relieved with rest. It is not associated with chest pain. There is no orthopnea, PND or pedal edema. There is no syncope or palpitations. There is no exertional chest pain.   Current Outpatient Prescriptions  Medication Sig Dispense Refill  . aspirin 81 MG tablet Take 81 mg by mouth daily.      . Bevacizumab (AVASTIN IV) Inject into the vein. Injection in left eye. Every 4 weeks.    . bisoprolol (ZEBETA) 5 MG tablet TAKE 1/2 TABLET (2.5 MG TOTAL) BY MOUTH 2 (TWO) TIMES DAILY. 90 tablet 0  . Calcium Carbonate (CALTRATE 600 PO) Take 1 tablet by mouth daily.    . diazepam (VALIUM) 5 MG tablet Take 5 mg by mouth as needed (as needed for eye injection).     . Iron-Vitamins (GERITOL COMPLETE) TABS Take by mouth daily.      Marland Kitchen lisinopril (PRINIVIL,ZESTRIL) 10 MG tablet Take 1.5 tablets by mouth daily.   4  . moxifloxacin (VIGAMOX) 0.5 % ophthalmic solution 1 drop as directed. Takes with injection in the eye    . Multiple Vitamins-Minerals (CENTRUM SILVER ULTRA MENS) TABS Take by mouth daily.      . Multiple Vitamins-Minerals (OCUVITE PRESERVISION) TABS Take by mouth daily.      . Nutritional Supplements (VITAMIN D MAINTENANCE PO) Take 1 tablet by mouth daily.    . pioglitazone (ACTOS) 15 MG tablet Take 2 tablets (30 mg total) by mouth daily. SEE COMMENT    . Polyethyl Glycol-Propyl Glycol (SYSTANE) 0.4-0.3 %  SOLN Apply to eye as directed.      . simvastatin (ZOCOR) 40 MG tablet Take 40 mg by mouth daily.    Marland Kitchen allopurinol (ZYLOPRIM) 100 MG tablet Take 50 mg by mouth daily.      No current facility-administered medications for this visit.      Past Medical History:  Diagnosis Date  . Anemia    Felt secondary renal insuffiency, seen by Dr. Ralene Ok - Workup in the past has included normal iron, folate, B12, serum and urine protein electrophoresis as well as an ANA. Peripheral blood smear had been normal  . Carotid arterial disease ([place])    Last dopplers XX123456 - RICA 123456, LICA XX123456 for recheck in 1 year  . Coronary artery disease    Moderate nonobstructive disease by cath 06/2011 (ruled out for MI/PE at that time)  . Dyslipidemia   . Esophageal reflux   . Gout   . Macular degeneration (senile) of retina, unspecified   . Osteoporosis   . Other and unspecified hyperlipidemia   . Pancytopenia 2010   Intermittent mild leukopenia and thrombocytopenia felt to be most likely due to immune dysregulation per Dr. Ralene Ok  . Type II or unspecified type diabetes mellitus without mention of complication, not stated as uncontrolled   . Unspecified essential hypertension     Past Surgical History:  Procedure Laterality Date  . abdominal cyst    .  ABDOMINAL HYSTERECTOMY     cyst removal  . APPENDECTOMY     age 38  . LEFT HEART CATHETERIZATION WITH CORONARY ANGIOGRAM N/A 06/30/2011   Procedure: LEFT HEART CATHETERIZATION WITH CORONARY ANGIOGRAM;  Surgeon: Peter M Martinique, MD;  Location: [facility] CATH LAB;  Service: Cardiovascular;  Laterality: N/A;    Social History   Social History  . Marital status: Widowed    Spouse name: N/A  . Number of children: N/A  . Years of education: N/A   Occupational History  . Not on file.   Social History Main Topics  . Smoking status: Never Smoker  . Smokeless tobacco: Never Used  . Alcohol use No  . Drug use: No  . Sexual activity: No   Other Topics  Concern  . Not on file   Social History Narrative   Lives in [place] and lives alone, retired form [employer] work.     Family History  Problem Relation Age of Onset  . Heart attack Father     in his 59's.  . Coronary artery disease Other     younger siblings  . Cancer Maternal Aunt     GYN    ROS: no fevers or chills, productive cough, hemoptysis, dysphasia, odynophagia, melena, hematochezia, dysuria, hematuria, rash, seizure activity, orthopnea, PND, pedal edema, claudication. Remaining systems are negative.  Physical Exam: Well-developed well-nourished in no acute distress.  Skin is warm and dry.  HEENT is normal.  Neck is supple.  Chest is clear to auscultation with normal expansion.  Cardiovascular exam is regular rate and rhythm. 2/6 systolic murmur LSB  Abdominal exam nontender or distended. No masses palpated. Extremities show no edema. neuro grossly intact  ECG-sinus bradycardia at a rate of 57. Occasional PAC. Right bundle branch block.  A/P  1 coronary artery disease-continue aspirin and statin.  2 hypertension-blood pressure elevated. Change lisinopril to 20 mg daily. Follow blood pressure and increase medications as needed. BUN 26 and creatinine 1.37 on most recent laboratories. Repeat 2 weeks.   3 hyperlipidemia-continue statin. Most recent laboratories from November 2017 reviewed. Total cholesterol 144 with LDL 66. Liver functions normal.  4 carotid artery disease-continue aspirin and statin. No significant disease on most recent carotid Dopplers.    Kirk Ruths, MD

## 2016-05-20 ENCOUNTER — Ambulatory Visit (INDEPENDENT_AMBULATORY_CARE_PROVIDER_SITE_OTHER): Payer: Medicare HMO | Admitting: Cardiology

## 2016-05-20 ENCOUNTER — Encounter: Payer: Self-pay | Admitting: Cardiology

## 2016-05-20 VITALS — BP 166/54 | HR 56 | Ht 61.0 in | Wt 128.2 lb

## 2016-05-20 DIAGNOSIS — E7800 Pure hypercholesterolemia, unspecified: Secondary | ICD-10-CM

## 2016-05-20 DIAGNOSIS — I2510 Atherosclerotic heart disease of native coronary artery without angina pectoris: Secondary | ICD-10-CM | POA: Diagnosis not present

## 2016-05-20 DIAGNOSIS — I10 Essential (primary) hypertension: Secondary | ICD-10-CM

## 2016-05-20 MED ORDER — LISINOPRIL 20 MG PO TABS: 20.0000 mg | ORAL_TABLET | Freq: Every day | ORAL | 3 refills | Status: DC

## 2016-05-20 NOTE — Patient Instructions (Signed)
Medication Instructions:   INCREASE LISINOPRIL TO 20 MG ONCE DAILY= 2 OF THE 10 MG TABLETS ONCE DAILY  Labwork:  Your physician recommends that you return for lab work in: 2 WEEKS  Follow-Up:  Your physician wants you to follow-up in: [place] will receive a reminder letter in the mail two months in advance. If you don't receive a letter, please call our office to schedule the follow-up appointment.   If you need a refill on your cardiac medications before your next appointment, please call your pharmacy.

## 2016-05-28 ENCOUNTER — Encounter (INDEPENDENT_AMBULATORY_CARE_PROVIDER_SITE_OTHER): Payer: Medicare HMO | Admitting: Ophthalmology

## 2016-05-28 DIAGNOSIS — E11311 Type 2 diabetes mellitus with unspecified diabetic retinopathy with macular edema: Secondary | ICD-10-CM

## 2016-05-28 DIAGNOSIS — H353231 Exudative age-related macular degeneration, bilateral, with active choroidal neovascularization: Secondary | ICD-10-CM | POA: Diagnosis not present

## 2016-05-28 DIAGNOSIS — I10 Essential (primary) hypertension: Secondary | ICD-10-CM

## 2016-05-28 DIAGNOSIS — H43813 Vitreous degeneration, bilateral: Secondary | ICD-10-CM | POA: Diagnosis not present

## 2016-05-28 DIAGNOSIS — E113213 Type 2 diabetes mellitus with mild nonproliferative diabetic retinopathy with macular edema, bilateral: Secondary | ICD-10-CM | POA: Diagnosis not present

## 2016-05-28 DIAGNOSIS — H35033 Hypertensive retinopathy, bilateral: Secondary | ICD-10-CM | POA: Diagnosis not present

## 2016-06-04 DIAGNOSIS — I2510 Atherosclerotic heart disease of native coronary artery without angina pectoris: Secondary | ICD-10-CM | POA: Diagnosis not present

## 2016-06-04 LAB — BASIC METABOLIC PANEL
BUN: 31 mg/dL — ABNORMAL HIGH (ref 7–25)
CO2: 28 mmol/L (ref 20–31)
Calcium: 10.1 mg/dL (ref 8.6–10.4)
Chloride: 100 mmol/L (ref 98–110)
Creat: 1.44 mg/dL — ABNORMAL HIGH (ref 0.60–0.88)
Glucose, Bld: 96 mg/dL (ref 65–99)
POTASSIUM: 5.1 mmol/L (ref 3.5–5.3)
Sodium: 139 mmol/L (ref 135–146)

## 2016-06-07 ENCOUNTER — Telehealth: Payer: Self-pay | Admitting: *Deleted

## 2016-06-07 DIAGNOSIS — N289 Disorder of kidney and ureter, unspecified: Secondary | ICD-10-CM

## 2016-06-07 NOTE — Telephone Encounter (Signed)
-----   Message from Lelon Perla, MD sent at 06/07/2016  7:10 AM EST ----- Increase fluid intake, bmet 2 weeks Kirk Ruths

## 2016-06-07 NOTE — Telephone Encounter (Signed)
pt aware of results, she reports an episode of chest pain on Saturday when using a chain saw and dragging limbs to the road. She put 325 mg Aspirin under her tongue and the pain eventually subsided. She had a similar discomfort several years ago that turned out to be muscle related. She feels fine today except her bp was 165/72 prior to taking her medications this morning. She had no SOB or other symptoms related to the pain. She will try tylenol or ibuprofen in the future for muscle pain. She will increase fluids and repeat labs in 2 weeks .Lab orders mailed to the pt

## 2016-06-16 DIAGNOSIS — L728 Other follicular cysts of the skin and subcutaneous tissue: Secondary | ICD-10-CM | POA: Diagnosis not present

## 2016-06-16 DIAGNOSIS — L578 Other skin changes due to chronic exposure to nonionizing radiation: Secondary | ICD-10-CM | POA: Diagnosis not present

## 2016-06-16 DIAGNOSIS — L820 Inflamed seborrheic keratosis: Secondary | ICD-10-CM | POA: Diagnosis not present

## 2016-06-16 DIAGNOSIS — L821 Other seborrheic keratosis: Secondary | ICD-10-CM | POA: Diagnosis not present

## 2016-06-18 DIAGNOSIS — E785 Hyperlipidemia, unspecified: Secondary | ICD-10-CM | POA: Diagnosis not present

## 2016-06-18 DIAGNOSIS — I10 Essential (primary) hypertension: Secondary | ICD-10-CM | POA: Diagnosis not present

## 2016-06-18 DIAGNOSIS — Z7982 Long term (current) use of aspirin: Secondary | ICD-10-CM | POA: Diagnosis not present

## 2016-06-18 DIAGNOSIS — R69 Illness, unspecified: Secondary | ICD-10-CM | POA: Diagnosis not present

## 2016-06-18 DIAGNOSIS — H353290 Exudative age-related macular degeneration, unspecified eye, stage unspecified: Secondary | ICD-10-CM | POA: Diagnosis not present

## 2016-06-18 DIAGNOSIS — M109 Gout, unspecified: Secondary | ICD-10-CM | POA: Diagnosis not present

## 2016-06-18 DIAGNOSIS — H6123 Impacted cerumen, bilateral: Secondary | ICD-10-CM | POA: Diagnosis not present

## 2016-06-18 DIAGNOSIS — E119 Type 2 diabetes mellitus without complications: Secondary | ICD-10-CM | POA: Diagnosis not present

## 2016-06-18 DIAGNOSIS — Z972 Presence of dental prosthetic device (complete) (partial): Secondary | ICD-10-CM | POA: Diagnosis not present

## 2016-06-18 DIAGNOSIS — Z0000 Encounter for general adult medical examination without abnormal findings: Secondary | ICD-10-CM | POA: Diagnosis not present

## 2016-06-18 DIAGNOSIS — Z6824 Body mass index (BMI) 24.0-24.9, adult: Secondary | ICD-10-CM | POA: Diagnosis not present

## 2016-06-22 ENCOUNTER — Encounter (INDEPENDENT_AMBULATORY_CARE_PROVIDER_SITE_OTHER): Payer: Medicare HMO | Admitting: Ophthalmology

## 2016-06-22 ENCOUNTER — Other Ambulatory Visit: Payer: Self-pay | Admitting: Cardiology

## 2016-06-22 DIAGNOSIS — H353231 Exudative age-related macular degeneration, bilateral, with active choroidal neovascularization: Secondary | ICD-10-CM

## 2016-06-22 DIAGNOSIS — H35033 Hypertensive retinopathy, bilateral: Secondary | ICD-10-CM | POA: Diagnosis not present

## 2016-06-22 DIAGNOSIS — E11311 Type 2 diabetes mellitus with unspecified diabetic retinopathy with macular edema: Secondary | ICD-10-CM

## 2016-06-22 DIAGNOSIS — E113211 Type 2 diabetes mellitus with mild nonproliferative diabetic retinopathy with macular edema, right eye: Secondary | ICD-10-CM

## 2016-06-22 DIAGNOSIS — E113312 Type 2 diabetes mellitus with moderate nonproliferative diabetic retinopathy with macular edema, left eye: Secondary | ICD-10-CM | POA: Diagnosis not present

## 2016-06-22 DIAGNOSIS — N289 Disorder of kidney and ureter, unspecified: Secondary | ICD-10-CM | POA: Diagnosis not present

## 2016-06-22 DIAGNOSIS — I10 Essential (primary) hypertension: Secondary | ICD-10-CM

## 2016-06-22 LAB — BASIC METABOLIC PANEL
BUN: 30 mg/dL — ABNORMAL HIGH (ref 7–25)
CO2: 28 mmol/L (ref 20–31)
Calcium: 10.1 mg/dL (ref 8.6–10.4)
Chloride: 99 mmol/L (ref 98–110)
Creat: 1.53 mg/dL — ABNORMAL HIGH (ref 0.60–0.88)
Glucose, Bld: 82 mg/dL (ref 65–99)
POTASSIUM: 5.3 mmol/L (ref 3.5–5.3)
SODIUM: 137 mmol/L (ref 135–146)

## 2016-06-23 ENCOUNTER — Telehealth: Payer: Self-pay | Admitting: *Deleted

## 2016-06-23 DIAGNOSIS — H40053 Ocular hypertension, bilateral: Secondary | ICD-10-CM | POA: Diagnosis not present

## 2016-06-23 DIAGNOSIS — N289 Disorder of kidney and ureter, unspecified: Secondary | ICD-10-CM

## 2016-06-23 DIAGNOSIS — H353233 Exudative age-related macular degeneration, bilateral, with inactive scar: Secondary | ICD-10-CM | POA: Diagnosis not present

## 2016-06-23 NOTE — Telephone Encounter (Signed)
Spoke with pt, aware of results. Lab orders mailed to the pt  

## 2016-06-23 NOTE — Telephone Encounter (Signed)
-----   Message from Lelon Perla, MD sent at 06/23/2016  5:17 AM EST ----- Increase po fluid intake, bmet 2 weeks Patricia Moran

## 2016-07-06 DIAGNOSIS — N289 Disorder of kidney and ureter, unspecified: Secondary | ICD-10-CM | POA: Diagnosis not present

## 2016-07-06 LAB — BASIC METABOLIC PANEL
BUN: 20 mg/dL (ref 7–25)
CALCIUM: 9.3 mg/dL (ref 8.6–10.4)
CHLORIDE: 99 mmol/L (ref 98–110)
CO2: 29 mmol/L (ref 20–31)
CREATININE: 1.15 mg/dL — AB (ref 0.60–0.88)
GLUCOSE: 97 mg/dL (ref 65–99)
Potassium: 4.4 mmol/L (ref 3.5–5.3)
Sodium: 136 mmol/L (ref 135–146)

## 2016-07-07 DIAGNOSIS — H40053 Ocular hypertension, bilateral: Secondary | ICD-10-CM | POA: Diagnosis not present

## 2016-07-12 DIAGNOSIS — R69 Illness, unspecified: Secondary | ICD-10-CM | POA: Diagnosis not present

## 2016-07-27 ENCOUNTER — Encounter (INDEPENDENT_AMBULATORY_CARE_PROVIDER_SITE_OTHER): Payer: Medicare HMO | Admitting: Ophthalmology

## 2016-07-27 DIAGNOSIS — I10 Essential (primary) hypertension: Secondary | ICD-10-CM

## 2016-07-27 DIAGNOSIS — E11311 Type 2 diabetes mellitus with unspecified diabetic retinopathy with macular edema: Secondary | ICD-10-CM | POA: Diagnosis not present

## 2016-07-27 DIAGNOSIS — E113312 Type 2 diabetes mellitus with moderate nonproliferative diabetic retinopathy with macular edema, left eye: Secondary | ICD-10-CM | POA: Diagnosis not present

## 2016-07-27 DIAGNOSIS — H35033 Hypertensive retinopathy, bilateral: Secondary | ICD-10-CM

## 2016-07-27 DIAGNOSIS — H353231 Exudative age-related macular degeneration, bilateral, with active choroidal neovascularization: Secondary | ICD-10-CM | POA: Diagnosis not present

## 2016-07-27 DIAGNOSIS — E113211 Type 2 diabetes mellitus with mild nonproliferative diabetic retinopathy with macular edema, right eye: Secondary | ICD-10-CM

## 2016-07-27 DIAGNOSIS — H43813 Vitreous degeneration, bilateral: Secondary | ICD-10-CM | POA: Diagnosis not present

## 2016-08-10 DIAGNOSIS — H40053 Ocular hypertension, bilateral: Secondary | ICD-10-CM | POA: Diagnosis not present

## 2016-08-17 ENCOUNTER — Encounter (INDEPENDENT_AMBULATORY_CARE_PROVIDER_SITE_OTHER): Payer: Medicare HMO | Admitting: Ophthalmology

## 2016-08-17 DIAGNOSIS — H43813 Vitreous degeneration, bilateral: Secondary | ICD-10-CM | POA: Diagnosis not present

## 2016-08-17 DIAGNOSIS — E113211 Type 2 diabetes mellitus with mild nonproliferative diabetic retinopathy with macular edema, right eye: Secondary | ICD-10-CM | POA: Diagnosis not present

## 2016-08-17 DIAGNOSIS — H35033 Hypertensive retinopathy, bilateral: Secondary | ICD-10-CM | POA: Diagnosis not present

## 2016-08-17 DIAGNOSIS — I10 Essential (primary) hypertension: Secondary | ICD-10-CM | POA: Diagnosis not present

## 2016-08-17 DIAGNOSIS — E11311 Type 2 diabetes mellitus with unspecified diabetic retinopathy with macular edema: Secondary | ICD-10-CM | POA: Diagnosis not present

## 2016-08-17 DIAGNOSIS — E113312 Type 2 diabetes mellitus with moderate nonproliferative diabetic retinopathy with macular edema, left eye: Secondary | ICD-10-CM

## 2016-08-17 DIAGNOSIS — H353231 Exudative age-related macular degeneration, bilateral, with active choroidal neovascularization: Secondary | ICD-10-CM | POA: Diagnosis not present

## 2016-08-26 DIAGNOSIS — R69 Illness, unspecified: Secondary | ICD-10-CM | POA: Diagnosis not present

## 2016-09-07 DIAGNOSIS — I10 Essential (primary) hypertension: Secondary | ICD-10-CM | POA: Diagnosis not present

## 2016-09-07 DIAGNOSIS — R739 Hyperglycemia, unspecified: Secondary | ICD-10-CM | POA: Diagnosis not present

## 2016-09-14 DIAGNOSIS — E7800 Pure hypercholesterolemia, unspecified: Secondary | ICD-10-CM | POA: Diagnosis not present

## 2016-09-14 DIAGNOSIS — R739 Hyperglycemia, unspecified: Secondary | ICD-10-CM | POA: Diagnosis not present

## 2016-09-14 DIAGNOSIS — Z0000 Encounter for general adult medical examination without abnormal findings: Secondary | ICD-10-CM | POA: Diagnosis not present

## 2016-09-14 DIAGNOSIS — I10 Essential (primary) hypertension: Secondary | ICD-10-CM | POA: Diagnosis not present

## 2016-09-14 DIAGNOSIS — D649 Anemia, unspecified: Secondary | ICD-10-CM | POA: Diagnosis not present

## 2016-09-15 ENCOUNTER — Encounter (INDEPENDENT_AMBULATORY_CARE_PROVIDER_SITE_OTHER): Payer: Medicare HMO | Admitting: Ophthalmology

## 2016-09-15 DIAGNOSIS — I10 Essential (primary) hypertension: Secondary | ICD-10-CM | POA: Diagnosis not present

## 2016-09-15 DIAGNOSIS — E11311 Type 2 diabetes mellitus with unspecified diabetic retinopathy with macular edema: Secondary | ICD-10-CM | POA: Diagnosis not present

## 2016-09-15 DIAGNOSIS — E113312 Type 2 diabetes mellitus with moderate nonproliferative diabetic retinopathy with macular edema, left eye: Secondary | ICD-10-CM

## 2016-09-15 DIAGNOSIS — H353231 Exudative age-related macular degeneration, bilateral, with active choroidal neovascularization: Secondary | ICD-10-CM

## 2016-09-15 DIAGNOSIS — E113211 Type 2 diabetes mellitus with mild nonproliferative diabetic retinopathy with macular edema, right eye: Secondary | ICD-10-CM | POA: Diagnosis not present

## 2016-09-15 DIAGNOSIS — H35033 Hypertensive retinopathy, bilateral: Secondary | ICD-10-CM | POA: Diagnosis not present

## 2016-09-15 DIAGNOSIS — H43813 Vitreous degeneration, bilateral: Secondary | ICD-10-CM

## 2016-10-07 DIAGNOSIS — R69 Illness, unspecified: Secondary | ICD-10-CM | POA: Diagnosis not present

## 2016-10-14 ENCOUNTER — Encounter (INDEPENDENT_AMBULATORY_CARE_PROVIDER_SITE_OTHER): Payer: Medicare HMO | Admitting: Ophthalmology

## 2016-10-14 DIAGNOSIS — E113311 Type 2 diabetes mellitus with moderate nonproliferative diabetic retinopathy with macular edema, right eye: Secondary | ICD-10-CM | POA: Diagnosis not present

## 2016-10-14 DIAGNOSIS — H35033 Hypertensive retinopathy, bilateral: Secondary | ICD-10-CM | POA: Diagnosis not present

## 2016-10-14 DIAGNOSIS — H353231 Exudative age-related macular degeneration, bilateral, with active choroidal neovascularization: Secondary | ICD-10-CM | POA: Diagnosis not present

## 2016-10-14 DIAGNOSIS — H43813 Vitreous degeneration, bilateral: Secondary | ICD-10-CM

## 2016-10-14 DIAGNOSIS — E11311 Type 2 diabetes mellitus with unspecified diabetic retinopathy with macular edema: Secondary | ICD-10-CM | POA: Diagnosis not present

## 2016-10-14 DIAGNOSIS — E113212 Type 2 diabetes mellitus with mild nonproliferative diabetic retinopathy with macular edema, left eye: Secondary | ICD-10-CM | POA: Diagnosis not present

## 2016-10-14 DIAGNOSIS — I10 Essential (primary) hypertension: Secondary | ICD-10-CM | POA: Diagnosis not present

## 2016-11-11 ENCOUNTER — Encounter (INDEPENDENT_AMBULATORY_CARE_PROVIDER_SITE_OTHER): Payer: Medicare HMO | Admitting: Ophthalmology

## 2016-11-11 DIAGNOSIS — H35033 Hypertensive retinopathy, bilateral: Secondary | ICD-10-CM

## 2016-11-11 DIAGNOSIS — I10 Essential (primary) hypertension: Secondary | ICD-10-CM | POA: Diagnosis not present

## 2016-11-11 DIAGNOSIS — E113313 Type 2 diabetes mellitus with moderate nonproliferative diabetic retinopathy with macular edema, bilateral: Secondary | ICD-10-CM | POA: Diagnosis not present

## 2016-11-11 DIAGNOSIS — H43813 Vitreous degeneration, bilateral: Secondary | ICD-10-CM | POA: Diagnosis not present

## 2016-11-11 DIAGNOSIS — H353231 Exudative age-related macular degeneration, bilateral, with active choroidal neovascularization: Secondary | ICD-10-CM | POA: Diagnosis not present

## 2016-11-11 DIAGNOSIS — E11311 Type 2 diabetes mellitus with unspecified diabetic retinopathy with macular edema: Secondary | ICD-10-CM | POA: Diagnosis not present

## 2016-12-09 ENCOUNTER — Encounter (INDEPENDENT_AMBULATORY_CARE_PROVIDER_SITE_OTHER): Payer: Medicare HMO | Admitting: Ophthalmology

## 2016-12-09 DIAGNOSIS — H43813 Vitreous degeneration, bilateral: Secondary | ICD-10-CM

## 2016-12-09 DIAGNOSIS — H353231 Exudative age-related macular degeneration, bilateral, with active choroidal neovascularization: Secondary | ICD-10-CM | POA: Diagnosis not present

## 2016-12-09 DIAGNOSIS — H35033 Hypertensive retinopathy, bilateral: Secondary | ICD-10-CM | POA: Diagnosis not present

## 2016-12-09 DIAGNOSIS — E11311 Type 2 diabetes mellitus with unspecified diabetic retinopathy with macular edema: Secondary | ICD-10-CM

## 2016-12-09 DIAGNOSIS — I10 Essential (primary) hypertension: Secondary | ICD-10-CM

## 2016-12-09 DIAGNOSIS — E113313 Type 2 diabetes mellitus with moderate nonproliferative diabetic retinopathy with macular edema, bilateral: Secondary | ICD-10-CM

## 2016-12-15 DIAGNOSIS — Z85820 Personal history of malignant melanoma of skin: Secondary | ICD-10-CM | POA: Diagnosis not present

## 2016-12-15 DIAGNOSIS — L728 Other follicular cysts of the skin and subcutaneous tissue: Secondary | ICD-10-CM | POA: Diagnosis not present

## 2016-12-15 DIAGNOSIS — L570 Actinic keratosis: Secondary | ICD-10-CM | POA: Diagnosis not present

## 2017-01-06 ENCOUNTER — Encounter (INDEPENDENT_AMBULATORY_CARE_PROVIDER_SITE_OTHER): Payer: Medicare HMO | Admitting: Ophthalmology

## 2017-01-06 DIAGNOSIS — E11311 Type 2 diabetes mellitus with unspecified diabetic retinopathy with macular edema: Secondary | ICD-10-CM

## 2017-01-06 DIAGNOSIS — I10 Essential (primary) hypertension: Secondary | ICD-10-CM

## 2017-01-06 DIAGNOSIS — H353231 Exudative age-related macular degeneration, bilateral, with active choroidal neovascularization: Secondary | ICD-10-CM

## 2017-01-06 DIAGNOSIS — H35033 Hypertensive retinopathy, bilateral: Secondary | ICD-10-CM

## 2017-01-06 DIAGNOSIS — H43813 Vitreous degeneration, bilateral: Secondary | ICD-10-CM

## 2017-01-06 DIAGNOSIS — E113313 Type 2 diabetes mellitus with moderate nonproliferative diabetic retinopathy with macular edema, bilateral: Secondary | ICD-10-CM

## 2017-02-01 DIAGNOSIS — R69 Illness, unspecified: Secondary | ICD-10-CM | POA: Diagnosis not present

## 2017-02-02 ENCOUNTER — Encounter (INDEPENDENT_AMBULATORY_CARE_PROVIDER_SITE_OTHER): Payer: Medicare HMO | Admitting: Ophthalmology

## 2017-02-02 DIAGNOSIS — E113212 Type 2 diabetes mellitus with mild nonproliferative diabetic retinopathy with macular edema, left eye: Secondary | ICD-10-CM

## 2017-02-02 DIAGNOSIS — H353231 Exudative age-related macular degeneration, bilateral, with active choroidal neovascularization: Secondary | ICD-10-CM | POA: Diagnosis not present

## 2017-02-02 DIAGNOSIS — E11311 Type 2 diabetes mellitus with unspecified diabetic retinopathy with macular edema: Secondary | ICD-10-CM | POA: Diagnosis not present

## 2017-02-02 DIAGNOSIS — E113311 Type 2 diabetes mellitus with moderate nonproliferative diabetic retinopathy with macular edema, right eye: Secondary | ICD-10-CM | POA: Diagnosis not present

## 2017-02-02 DIAGNOSIS — I10 Essential (primary) hypertension: Secondary | ICD-10-CM | POA: Diagnosis not present

## 2017-02-02 DIAGNOSIS — H43813 Vitreous degeneration, bilateral: Secondary | ICD-10-CM | POA: Diagnosis not present

## 2017-02-02 DIAGNOSIS — H35033 Hypertensive retinopathy, bilateral: Secondary | ICD-10-CM | POA: Diagnosis not present

## 2017-03-02 ENCOUNTER — Encounter (INDEPENDENT_AMBULATORY_CARE_PROVIDER_SITE_OTHER): Payer: Medicare HMO | Admitting: Ophthalmology

## 2017-03-02 DIAGNOSIS — E11311 Type 2 diabetes mellitus with unspecified diabetic retinopathy with macular edema: Secondary | ICD-10-CM

## 2017-03-02 DIAGNOSIS — I10 Essential (primary) hypertension: Secondary | ICD-10-CM

## 2017-03-02 DIAGNOSIS — H43813 Vitreous degeneration, bilateral: Secondary | ICD-10-CM

## 2017-03-02 DIAGNOSIS — H35033 Hypertensive retinopathy, bilateral: Secondary | ICD-10-CM | POA: Diagnosis not present

## 2017-03-02 DIAGNOSIS — E113313 Type 2 diabetes mellitus with moderate nonproliferative diabetic retinopathy with macular edema, bilateral: Secondary | ICD-10-CM

## 2017-03-02 DIAGNOSIS — H353231 Exudative age-related macular degeneration, bilateral, with active choroidal neovascularization: Secondary | ICD-10-CM | POA: Diagnosis not present

## 2017-03-15 DIAGNOSIS — I10 Essential (primary) hypertension: Secondary | ICD-10-CM | POA: Diagnosis not present

## 2017-03-15 DIAGNOSIS — D638 Anemia in other chronic diseases classified elsewhere: Secondary | ICD-10-CM | POA: Diagnosis not present

## 2017-03-15 DIAGNOSIS — N390 Urinary tract infection, site not specified: Secondary | ICD-10-CM | POA: Diagnosis not present

## 2017-03-15 DIAGNOSIS — Z79899 Other long term (current) drug therapy: Secondary | ICD-10-CM | POA: Diagnosis not present

## 2017-03-15 DIAGNOSIS — D61818 Other pancytopenia: Secondary | ICD-10-CM | POA: Diagnosis not present

## 2017-03-15 DIAGNOSIS — D649 Anemia, unspecified: Secondary | ICD-10-CM | POA: Diagnosis not present

## 2017-03-15 DIAGNOSIS — E7800 Pure hypercholesterolemia, unspecified: Secondary | ICD-10-CM | POA: Diagnosis not present

## 2017-03-15 DIAGNOSIS — R739 Hyperglycemia, unspecified: Secondary | ICD-10-CM | POA: Diagnosis not present

## 2017-03-22 DIAGNOSIS — R739 Hyperglycemia, unspecified: Secondary | ICD-10-CM | POA: Diagnosis not present

## 2017-03-22 DIAGNOSIS — I10 Essential (primary) hypertension: Secondary | ICD-10-CM | POA: Diagnosis not present

## 2017-03-22 DIAGNOSIS — Z0000 Encounter for general adult medical examination without abnormal findings: Secondary | ICD-10-CM | POA: Diagnosis not present

## 2017-03-30 ENCOUNTER — Encounter (INDEPENDENT_AMBULATORY_CARE_PROVIDER_SITE_OTHER): Payer: Medicare HMO | Admitting: Ophthalmology

## 2017-03-30 DIAGNOSIS — H35033 Hypertensive retinopathy, bilateral: Secondary | ICD-10-CM

## 2017-03-30 DIAGNOSIS — I10 Essential (primary) hypertension: Secondary | ICD-10-CM

## 2017-03-30 DIAGNOSIS — H43813 Vitreous degeneration, bilateral: Secondary | ICD-10-CM | POA: Diagnosis not present

## 2017-03-30 DIAGNOSIS — E113312 Type 2 diabetes mellitus with moderate nonproliferative diabetic retinopathy with macular edema, left eye: Secondary | ICD-10-CM | POA: Diagnosis not present

## 2017-03-30 DIAGNOSIS — H353231 Exudative age-related macular degeneration, bilateral, with active choroidal neovascularization: Secondary | ICD-10-CM

## 2017-03-30 DIAGNOSIS — E11311 Type 2 diabetes mellitus with unspecified diabetic retinopathy with macular edema: Secondary | ICD-10-CM | POA: Diagnosis not present

## 2017-03-30 DIAGNOSIS — E113391 Type 2 diabetes mellitus with moderate nonproliferative diabetic retinopathy without macular edema, right eye: Secondary | ICD-10-CM

## 2017-04-27 ENCOUNTER — Encounter (INDEPENDENT_AMBULATORY_CARE_PROVIDER_SITE_OTHER): Payer: Medicare HMO | Admitting: Ophthalmology

## 2017-04-27 DIAGNOSIS — H43813 Vitreous degeneration, bilateral: Secondary | ICD-10-CM

## 2017-04-27 DIAGNOSIS — E113211 Type 2 diabetes mellitus with mild nonproliferative diabetic retinopathy with macular edema, right eye: Secondary | ICD-10-CM | POA: Diagnosis not present

## 2017-04-27 DIAGNOSIS — E11311 Type 2 diabetes mellitus with unspecified diabetic retinopathy with macular edema: Secondary | ICD-10-CM | POA: Diagnosis not present

## 2017-04-27 DIAGNOSIS — I10 Essential (primary) hypertension: Secondary | ICD-10-CM

## 2017-04-27 DIAGNOSIS — E103292 Type 1 diabetes mellitus with mild nonproliferative diabetic retinopathy without macular edema, left eye: Secondary | ICD-10-CM

## 2017-04-27 DIAGNOSIS — H35033 Hypertensive retinopathy, bilateral: Secondary | ICD-10-CM

## 2017-04-27 DIAGNOSIS — H353231 Exudative age-related macular degeneration, bilateral, with active choroidal neovascularization: Secondary | ICD-10-CM

## 2017-05-12 DIAGNOSIS — I509 Heart failure, unspecified: Secondary | ICD-10-CM | POA: Diagnosis not present

## 2017-05-13 ENCOUNTER — Encounter (HOSPITAL_COMMUNITY): Payer: Self-pay | Admitting: Emergency Medicine

## 2017-05-13 ENCOUNTER — Emergency Department (HOSPITAL_COMMUNITY): Payer: Medicare HMO

## 2017-05-13 ENCOUNTER — Other Ambulatory Visit: Payer: Self-pay

## 2017-05-13 ENCOUNTER — Emergency Department (HOSPITAL_COMMUNITY)
Admission: EM | Admit: 2017-05-13 | Discharge: 2017-05-13 | Disposition: A | Discharge status: Home / Self Care | Payer: Medicare HMO | Attending: Emergency Medicine | Admitting: Emergency Medicine

## 2017-05-13 DIAGNOSIS — M25561 Pain in right knee: Secondary | ICD-10-CM | POA: Insufficient documentation

## 2017-05-13 DIAGNOSIS — I2510 Atherosclerotic heart disease of native coronary artery without angina pectoris: Secondary | ICD-10-CM | POA: Diagnosis not present

## 2017-05-13 DIAGNOSIS — Z79899 Other long term (current) drug therapy: Secondary | ICD-10-CM | POA: Diagnosis not present

## 2017-05-13 DIAGNOSIS — E1122 Type 2 diabetes mellitus with diabetic chronic kidney disease: Secondary | ICD-10-CM | POA: Diagnosis not present

## 2017-05-13 DIAGNOSIS — I129 Hypertensive chronic kidney disease with stage 1 through stage 4 chronic kidney disease, or unspecified chronic kidney disease: Secondary | ICD-10-CM | POA: Diagnosis not present

## 2017-05-13 DIAGNOSIS — M25461 Effusion, right knee: Secondary | ICD-10-CM | POA: Diagnosis not present

## 2017-05-13 DIAGNOSIS — N189 Chronic kidney disease, unspecified: Secondary | ICD-10-CM | POA: Diagnosis not present

## 2017-05-13 DIAGNOSIS — Z7982 Long term (current) use of aspirin: Secondary | ICD-10-CM | POA: Insufficient documentation

## 2017-05-13 MED ORDER — TRAMADOL HCL 50 MG PO TABS
50.0000 mg | ORAL_TABLET | Freq: Once | ORAL | Status: AC
  Administered 2017-05-13: 50 mg via ORAL
  Filled 2017-05-13: qty 1

## 2017-05-13 MED ORDER — TRAMADOL HCL 50 MG PO TABS: 50.0000 mg | ORAL_TABLET | Freq: Two times a day (BID) | ORAL | 0 refills | Status: DC | PRN

## 2017-05-13 NOTE — ED Triage Notes (Addendum)
Patient presents to the ED with c/o right leg pain that started on Tuesday. She states that she was hurting, felt like she "strained" it but does not remember anything happening. She has self treated with Tylenol, pain cream, and Heating pad without any relief.

## 2017-05-13 NOTE — Discharge Instructions (Signed)
1. Medications: Take [phone] mg of Tylenol every 6 hours as needed for pain.  Do not exceed 4000 mg of Tylenol daily.  You may take tramadol up to twice daily as needed for severe pain, but do not drive, drink alcohol, or operate heavy machinery on this medicine.  It may make you drowsy., usual home medications 2. Treatment: rest, ice, elevate and use knee sleeve, drink plenty of fluids, gentle stretching.  Use your walker to ambulate. 3. Follow Up: Please followup with orthopedics as directed or your PCP in 1 week if no improvement for discussion of your diagnoses and further evaluation after today's visit; if you do not have a primary care doctor use the resource guide provided to find one; Please return to the ER for worsening symptoms or other concerns

## 2017-05-13 NOTE — ED Provider Notes (Signed)
[facility] EMERGENCY DEPARTMENT Provider Note   CSN: [id number] Arrival date & time: 05/13/17  1737     History   Chief Complaint Chief Complaint  Patient presents with  . Leg Pain    HPI Patricia Moran is a 84 y.o. female with history of CAD, dyslipidemia, GERD, gout, macular degeneration, type 2 diabetes, and CKD presents today with chief complaint acute onset, progressively worsening right knee pain which began Tuesday evening 4 days ago.  She denies any trauma, falls, or bending or lifting or twisting injuries.  She states pain is constant and throbbing at rest but becomes sharp when she tries to ambulate or bend the knee.  Pain initially began in the lateral aspect of the knee but over the past several days has begun to radiate down the leg and up to the hip.  She denies numbness, tingling, or weakness.  She has tried Tylenol and heat without significant relief of her symptoms.  She denies fevers or chills.  She denies back pain.  She denies chest pain or shortness of breath.  No prior history of DVT or PE.  She lives at home alone and is typically ambulatory without difficulty but has had to resort to using a walker to ambulate over the past several days. The history is provided by the patient.    Past Medical History:  Diagnosis Date  . Anemia    Felt secondary renal insuffiency, seen by Dr. Ralene Ok - Workup in the past has included normal iron, folate, B12, serum and urine protein electrophoresis as well as an ANA. Peripheral blood smear had been normal  . Carotid arterial disease ([place])    Last dopplers 07/1738 - RICA 81-44%, LICA 8-18% for recheck in 1 year  . Coronary artery disease    Moderate nonobstructive disease by cath 06/2011 (ruled out for MI/PE at that time)  . Dyslipidemia   . Esophageal reflux   . Gout   . Macular degeneration (senile) of retina, unspecified   . Osteoporosis   . Other and unspecified hyperlipidemia   . Pancytopenia 2010   Intermittent mild leukopenia and thrombocytopenia felt to be most likely due to immune dysregulation per Dr. Ralene Ok  . Type II or unspecified type diabetes mellitus without mention of complication, not stated as uncontrolled   . Unspecified essential hypertension     Patient Active Problem List   Diagnosis Date Noted  . CAD (coronary artery disease) 10/08/2011  . Chest pain 07/01/2011  . Anemia 06/30/2011  . Anemia in chronic kidney disease(285.21) 06/15/2011  . Pancytopenia 06/15/2011  . SYNCOPE 08/12/2009  . MITRAL REGURGITATION 12/25/2008  . CAROTID STENOSIS 12/25/2008  . DIABETES MELLITUS, TYPE II 07/23/2008  . HYPERLIPIDEMIA 07/23/2008  . MACULAR DEGENERATION 07/23/2008  . Essential hypertension 07/23/2008  . GASTROESOPHAGEAL REFLUX DISEASE 07/23/2008  . OSTEOPOROSIS 07/23/2008  . CHEST PAIN, ATYPICAL 07/23/2008  . GOUT, HX OF 07/23/2008    Past Surgical History:  Procedure Laterality Date  . abdominal cyst    . ABDOMINAL HYSTERECTOMY     cyst removal  . APPENDECTOMY     age 26  . LEFT HEART CATHETERIZATION WITH CORONARY ANGIOGRAM N/A 06/30/2011   Procedure: LEFT HEART CATHETERIZATION WITH CORONARY ANGIOGRAM;  Surgeon: Peter M Martinique, MD;  Location: [facility] CATH LAB;  Service: Cardiovascular;  Laterality: N/A;    OB History    No data available       Home Medications    Prior to Admission medications   Medication  Sig Start Date End Date Taking? Authorizing Provider  allopurinol (ZYLOPRIM) 100 MG tablet Take 50 mg by mouth daily.     [provider]  aspirin 81 MG tablet Take 81 mg by mouth daily.      [provider]  Bevacizumab (AVASTIN IV) Inject into the vein. Injection in left eye. Every 4 weeks.    [provider]  bisoprolol (ZEBETA) 5 MG tablet TAKE 1/2 TABLET (2.5 MG TOTAL) BY MOUTH 2 (TWO) TIMES DAILY. 03/02/13   Lelon Perla, MD  Calcium Carbonate (CALTRATE 600 PO) Take 1 tablet by mouth daily.    [provider]    diazepam (VALIUM) 5 MG tablet Take 5 mg by mouth as needed (as needed for eye injection).     [provider]  Iron-Vitamins (GERITOL COMPLETE) TABS Take by mouth daily.      [provider]  lisinopril (PRINIVIL,ZESTRIL) 20 MG tablet Take 1 tablet (20 mg total) by mouth daily. 05/20/16   Lelon Perla, MD  moxifloxacin (VIGAMOX) 0.5 % ophthalmic solution 1 drop as directed. Takes with injection in the eye    [provider]  Multiple Vitamins-Minerals (CENTRUM SILVER ULTRA MENS) TABS Take by mouth daily.      [provider]  Multiple Vitamins-Minerals (OCUVITE PRESERVISION) TABS Take by mouth daily.      [provider]  Nutritional Supplements (VITAMIN D MAINTENANCE PO) Take 1 tablet by mouth daily.    [provider]  pioglitazone (ACTOS) 15 MG tablet Take 2 tablets (30 mg total) by mouth daily. SEE COMMENT 07/01/11   Dunn, Nedra Hai, PA-C  Polyethyl Glycol-Propyl Glycol (SYSTANE) 0.4-0.3 % SOLN Apply to eye as directed.      [provider]  simvastatin (ZOCOR) 40 MG tablet Take 40 mg by mouth daily.    [provider]  traMADol (ULTRAM) 50 MG tablet Take 1 tablet (50 mg total) by mouth every 12 (twelve) hours as needed for severe pain. 05/13/17   Renita Papa, PA-C    Family History Family History  Problem Relation Age of Onset  . Heart attack Father        in his 49's.  . Coronary artery disease Other        younger siblings  . Cancer Maternal Aunt        GYN    Social History Social History   Tobacco Use  . Smoking status: Never Smoker  . Smokeless tobacco: Never Used  Substance Use Topics  . Alcohol use: No  . Drug use: No     Allergies   Hydrocodone; Nitroglycerin; Ofloxacin; and Sulfonamide derivatives   Review of Systems Review of Systems  Constitutional: Negative for chills and fever.  Respiratory: Negative for shortness of breath.   Cardiovascular: Negative for chest pain.   Gastrointestinal: Negative for abdominal pain, nausea and vomiting.  Musculoskeletal: Positive for arthralgias (R knee). Negative for back pain and neck pain.  Neurological: Negative for syncope, weakness and numbness.  All other systems reviewed and are negative.    Physical Exam Updated Vital Signs BP (!) 156/46 (BP Location: Right Arm)   Pulse (!) 53   Temp 97.8 F (36.6 C) (Oral)   Resp 16   Ht 5\' 1"  (1.549 m)   Wt 58.1 kg (128 lb)   SpO2 98%   BMI 24.19 kg/m   Physical Exam  Constitutional: She is oriented to person, place, and time. She appears well-developed and well-nourished. No distress.  HENT:  Head: Normocephalic and atraumatic.  Eyes: Conjunctivae are normal. Right eye exhibits no discharge. Left eye exhibits no discharge.  Neck: No JVD present. No tracheal deviation present.  Cardiovascular: Normal rate and intact distal pulses.  2+ DP/PT pulses bilaterally, no lower extremity edema, no calf pain, compartments are soft, no palpable cords  Pulmonary/Chest: Effort normal.  Abdominal: She exhibits no distension.  Musculoskeletal: Normal range of motion. She exhibits tenderness. She exhibits no edema.  Focally tender overlying the right LCL and MCL.  No patellar tendon tenderness, patient is able to extend her right knee against gravity without difficulty.  5/5 strength of BLE major muscle groups.  Negative anterior/posterior drawer test bilaterally.  No tenderness to palpation of the hips.  No erythema, warmth, or crepitus noted. No midline spine TTP, no paraspinal muscle tenderness, no deformity, crepitus, or step-off noted.  Patient is able to ambulate although it is painful.  Neurological: She is alert and oriented to person, place, and time. No cranial nerve deficit or sensory deficit. She exhibits normal muscle tone.  Fluent speech, no facial droop, sensation intact to soft touch of bilateral lower extremities.  Mildly antalgic gait with good balance.  Patient with  some difficulty walking on heels and toes but is able to do so with assistance.  Skin: No erythema.  Psychiatric: She has a normal mood and affect. Her behavior is normal.  Nursing note and vitals reviewed.    ED Treatments / Results  Labs (all labs ordered are listed, but only abnormal results are displayed) Labs Reviewed - No data to display  EKG  EKG Interpretation None       Radiology Dg Knee Complete 4 Views Right  Result Date: 05/13/2017 CLINICAL DATA:  Right leg pain EXAM: RIGHT KNEE - COMPLETE 4+ VIEW COMPARISON:  None. FINDINGS: No evidence of fracture, or dislocation. Moderate degenerative changes of the medial compartment. Disc space calcifications. Vascular calcifications. Small joint effusion. IMPRESSION: 1.  No acute osseous abnormalities 2.  Small knee effusion 3.  Degenerative changes with chondrocalcinosis Electronically Signed   By: Donavan Foil M.D.   On: 05/13/2017 21:30    Procedures Procedures (including critical care time)  Medications Ordered in ED Medications  traMADol (ULTRAM) tablet 50 mg (50 mg Oral Given 05/13/17 2109)     Initial Impression / Assessment and Plan / ED Course  I have reviewed the triage vital signs and the nursing notes.  Pertinent labs & imaging results that were available during my care of the patient were reviewed by me and considered in my medical decision making (see chart for details).     Patient with acute right knee pain, progressively worsening.  Afebrile, initially hypertensive while in the ED with return to patient's baseline on reevaluation after administration of pain medicine.  Suspect hypertension likely secondary to pain.  On examination, she exhibits good range of motion actively and passively of the bilateral lower extremities, with pain elicited on flexion of the right knee.  She is ambulatory although it is painful and requires some assistance but exhibits good strength.  No focal neurologic deficits.  No  midline spine tenderness and no red flag signs concerning for cauda equina or spinal abscess.  No tenderness to palpation of the hips.  Radiographs reviewed by me show no acute osseous abnormality but does show moderate degenerative changes of the medial compartment as well as a small knee effusion.  She is focally tender overlying the LCL and MCL right knee but  exhibits no ligamentous laxity or varus or valgus deformity.  Low suspicion of DVT in the absence of swelling or color change.  Low suspicion of septic joint or gout in the absence of erythema or constitutional symptoms and with good active and passive range of motion.  No evidence of osteomyelitis.  Patient states pain has improved after administration of tramadol.  Will discharge with a knee sleeve. RICE therapy indicated and discussed with patient and her daughter.  She will continue using her walker to help her ambulate.  Discussed indications for return to the ED.  She will follow-up with her primary care physician or an orthopedist for reevaluation.  Patient and patient's daughter verbalized understanding of and agreement with plan and patient is stable for discharge at this time.  Final Clinical Impressions(s) / ED Diagnoses   Final diagnoses:  Acute pain of right knee    ED Discharge Orders        Ordered    traMADol (ULTRAM) 50 MG tablet  Every 12 hours PRN     05/13/17 2213       Renita Papa, PA-C 05/14/17 0100    Virgel Manifold, MD 05/14/17 1642

## 2017-05-13 NOTE — Progress Notes (Signed)
Orthopedic Tech Progress Note Patient Details:  Patricia Moran 23-Jan-1930 [id number]  Ortho Devices Type of Ortho Device: Knee Sleeve Ortho Device/Splint Location: RLE Ortho Device/Splint Interventions: Ordered, Application   Post Interventions Patient Tolerated: Well Instructions Provided: Care of device   Braulio Bosch 05/13/2017, 10:24 PM

## 2017-05-17 DIAGNOSIS — Z8739 Personal history of other diseases of the musculoskeletal system and connective tissue: Secondary | ICD-10-CM | POA: Diagnosis not present

## 2017-05-17 DIAGNOSIS — M79604 Pain in right leg: Secondary | ICD-10-CM | POA: Diagnosis not present

## 2017-05-17 DIAGNOSIS — M25561 Pain in right knee: Secondary | ICD-10-CM | POA: Diagnosis not present

## 2017-05-17 DIAGNOSIS — M1120 Other chondrocalcinosis, unspecified site: Secondary | ICD-10-CM | POA: Diagnosis not present

## 2017-05-19 ENCOUNTER — Other Ambulatory Visit: Payer: Self-pay | Admitting: Cardiology

## 2017-05-19 DIAGNOSIS — I2510 Atherosclerotic heart disease of native coronary artery without angina pectoris: Secondary | ICD-10-CM

## 2017-05-25 ENCOUNTER — Encounter (INDEPENDENT_AMBULATORY_CARE_PROVIDER_SITE_OTHER): Payer: Medicare HMO | Admitting: Ophthalmology

## 2017-05-25 DIAGNOSIS — E113213 Type 2 diabetes mellitus with mild nonproliferative diabetic retinopathy with macular edema, bilateral: Secondary | ICD-10-CM

## 2017-05-25 DIAGNOSIS — H35033 Hypertensive retinopathy, bilateral: Secondary | ICD-10-CM

## 2017-05-25 DIAGNOSIS — E11311 Type 2 diabetes mellitus with unspecified diabetic retinopathy with macular edema: Secondary | ICD-10-CM | POA: Diagnosis not present

## 2017-05-25 DIAGNOSIS — H43813 Vitreous degeneration, bilateral: Secondary | ICD-10-CM | POA: Diagnosis not present

## 2017-05-25 DIAGNOSIS — I10 Essential (primary) hypertension: Secondary | ICD-10-CM | POA: Diagnosis not present

## 2017-05-25 DIAGNOSIS — H353231 Exudative age-related macular degeneration, bilateral, with active choroidal neovascularization: Secondary | ICD-10-CM

## 2017-05-26 DIAGNOSIS — N189 Chronic kidney disease, unspecified: Secondary | ICD-10-CM | POA: Diagnosis not present

## 2017-05-26 DIAGNOSIS — M25569 Pain in unspecified knee: Secondary | ICD-10-CM | POA: Diagnosis not present

## 2017-05-26 DIAGNOSIS — M1990 Unspecified osteoarthritis, unspecified site: Secondary | ICD-10-CM | POA: Diagnosis not present

## 2017-05-26 DIAGNOSIS — M1180 Other specified crystal arthropathies, unspecified site: Secondary | ICD-10-CM | POA: Diagnosis not present

## 2017-05-26 DIAGNOSIS — M064 Inflammatory polyarthropathy: Secondary | ICD-10-CM | POA: Diagnosis not present

## 2017-06-15 DIAGNOSIS — L821 Other seborrheic keratosis: Secondary | ICD-10-CM | POA: Diagnosis not present

## 2017-06-15 DIAGNOSIS — L820 Inflamed seborrheic keratosis: Secondary | ICD-10-CM | POA: Diagnosis not present

## 2017-06-15 DIAGNOSIS — Z85820 Personal history of malignant melanoma of skin: Secondary | ICD-10-CM | POA: Diagnosis not present

## 2017-06-15 DIAGNOSIS — L578 Other skin changes due to chronic exposure to nonionizing radiation: Secondary | ICD-10-CM | POA: Diagnosis not present

## 2017-06-21 DIAGNOSIS — H547 Unspecified visual loss: Secondary | ICD-10-CM | POA: Diagnosis not present

## 2017-06-21 DIAGNOSIS — E11620 Type 2 diabetes mellitus with diabetic dermatitis: Secondary | ICD-10-CM | POA: Diagnosis not present

## 2017-06-21 DIAGNOSIS — M109 Gout, unspecified: Secondary | ICD-10-CM | POA: Diagnosis not present

## 2017-06-21 DIAGNOSIS — H04129 Dry eye syndrome of unspecified lacrimal gland: Secondary | ICD-10-CM | POA: Diagnosis not present

## 2017-06-21 DIAGNOSIS — K08109 Complete loss of teeth, unspecified cause, unspecified class: Secondary | ICD-10-CM | POA: Diagnosis not present

## 2017-06-21 DIAGNOSIS — I10 Essential (primary) hypertension: Secondary | ICD-10-CM | POA: Diagnosis not present

## 2017-06-21 DIAGNOSIS — H40059 Ocular hypertension, unspecified eye: Secondary | ICD-10-CM | POA: Diagnosis not present

## 2017-06-21 DIAGNOSIS — Z7982 Long term (current) use of aspirin: Secondary | ICD-10-CM | POA: Diagnosis not present

## 2017-06-21 DIAGNOSIS — E785 Hyperlipidemia, unspecified: Secondary | ICD-10-CM | POA: Diagnosis not present

## 2017-06-21 DIAGNOSIS — I499 Cardiac arrhythmia, unspecified: Secondary | ICD-10-CM | POA: Diagnosis not present

## 2017-06-23 ENCOUNTER — Encounter (INDEPENDENT_AMBULATORY_CARE_PROVIDER_SITE_OTHER): Payer: Medicare HMO | Admitting: Ophthalmology

## 2017-06-23 DIAGNOSIS — H35033 Hypertensive retinopathy, bilateral: Secondary | ICD-10-CM

## 2017-06-23 DIAGNOSIS — H353231 Exudative age-related macular degeneration, bilateral, with active choroidal neovascularization: Secondary | ICD-10-CM | POA: Diagnosis not present

## 2017-06-23 DIAGNOSIS — E11311 Type 2 diabetes mellitus with unspecified diabetic retinopathy with macular edema: Secondary | ICD-10-CM

## 2017-06-23 DIAGNOSIS — I10 Essential (primary) hypertension: Secondary | ICD-10-CM | POA: Diagnosis not present

## 2017-06-23 DIAGNOSIS — H43813 Vitreous degeneration, bilateral: Secondary | ICD-10-CM

## 2017-06-23 DIAGNOSIS — E113212 Type 2 diabetes mellitus with mild nonproliferative diabetic retinopathy with macular edema, left eye: Secondary | ICD-10-CM

## 2017-06-23 DIAGNOSIS — E113311 Type 2 diabetes mellitus with moderate nonproliferative diabetic retinopathy with macular edema, right eye: Secondary | ICD-10-CM

## 2017-07-01 ENCOUNTER — Encounter: Payer: Self-pay | Admitting: Cardiology

## 2017-07-04 NOTE — Progress Notes (Signed)
HPI: FU CAD; seen in the past for chest pain and near syncope. An echocardiogram performed in March of 2011 showed normal EF. There was mild aortic and mitral regurgitation. A CardioNet monitor was performed in November of 2011 related to syncope and revealed sinus with pacs. LHC 06/30/11: LAD diffuse 40-50%, circumflex irregular with less than 10% stenosis, mid RCA 50-60%, EF 55-65%. Medical therapy was recommended. Carotid dopplers January 2016 showed no significant stenosis. Since she was last seen, the patient has dyspnea with more extreme activities but not with routine activities. It is relieved with rest. It is not associated with chest pain. There is no orthopnea, PND or pedal edema. There is no syncope or palpitations. There is no exertional chest pain.   Current Outpatient Medications  Medication Sig Dispense Refill  . allopurinol (ZYLOPRIM) 100 MG tablet Take 50 mg by mouth daily.     Marland Kitchen aspirin 81 MG tablet Take 81 mg by mouth daily.      . Bevacizumab (AVASTIN IV) Inject into the vein. Injection in left eye. Every 4 weeks.    . bisoprolol (ZEBETA) 5 MG tablet TAKE 1/2 TABLET (2.5 MG TOTAL) BY MOUTH 2 (TWO) TIMES DAILY. 90 tablet 0  . Calcium Carbonate (CALTRATE 600 PO) Take 1 tablet by mouth daily.    . diazepam (VALIUM) 5 MG tablet Take 5 mg by mouth as needed (as needed for eye injection).     . Iron-Vitamins (GERITOL COMPLETE) TABS Take by mouth daily.      Marland Kitchen lisinopril (PRINIVIL,ZESTRIL) 20 MG tablet TAKE 1 TABLET (20 MG TOTAL) BY MOUTH DAILY. 90 tablet 3  . moxifloxacin (VIGAMOX) 0.5 % ophthalmic solution 1 drop as directed. Takes with injection in the eye    . Multiple Vitamins-Minerals (CENTRUM SILVER ULTRA MENS) TABS Take by mouth daily.      . Multiple Vitamins-Minerals (OCUVITE PRESERVISION) TABS Take by mouth daily.      . Nutritional Supplements (VITAMIN D MAINTENANCE PO) Take 1 tablet by mouth daily.    . pioglitazone (ACTOS) 15 MG tablet Take 2 tablets (30 mg  total) by mouth daily. SEE COMMENT    . Polyethyl Glycol-Propyl Glycol (SYSTANE) 0.4-0.3 % SOLN Apply to eye as directed.      . simvastatin (ZOCOR) 40 MG tablet Take 40 mg by mouth daily.    . traMADol (ULTRAM) 50 MG tablet Take 1 tablet (50 mg total) by mouth every 12 (twelve) hours as needed for severe pain. 10 tablet 0   No current facility-administered medications for this visit.      Past Medical History:  Diagnosis Date  . Anemia    Felt secondary renal insuffiency, seen by Dr. Ralene Ok - Workup in the past has included normal iron, folate, B12, serum and urine protein electrophoresis as well as an ANA. Peripheral blood smear had been normal  . Carotid arterial disease ([place])    Last dopplers 04/930 - RICA 35-57%, LICA 3-22% for recheck in 1 year  . Coronary artery disease    Moderate nonobstructive disease by cath 06/2011 (ruled out for MI/PE at that time)  . Dyslipidemia   . Esophageal reflux   . Gout   . Macular degeneration (senile) of retina, unspecified   . Osteoporosis   . Other and unspecified hyperlipidemia   . Pancytopenia 2010   Intermittent mild leukopenia and thrombocytopenia felt to be most likely due to immune dysregulation per Dr. Ralene Ok  . Type II or unspecified type diabetes mellitus  without mention of complication, not stated as uncontrolled   . Unspecified essential hypertension     Past Surgical History:  Procedure Laterality Date  . abdominal cyst    . ABDOMINAL HYSTERECTOMY     cyst removal  . APPENDECTOMY     age 71  . LEFT HEART CATHETERIZATION WITH CORONARY ANGIOGRAM N/A 06/30/2011   Procedure: LEFT HEART CATHETERIZATION WITH CORONARY ANGIOGRAM;  Surgeon: Peter M Martinique, MD;  Location: [facility] CATH LAB;  Service: Cardiovascular;  Laterality: N/A;    Social History   Socioeconomic History  . Marital status: Widowed    Spouse name: Not on file  . Number of children: Not on file  . Years of education: Not on file  . Highest education level: Not  on file  Occupational History  . Not on file  Social Needs  . Financial resource strain: Not on file  . Food insecurity:    Worry: Not on file    Inability: Not on file  . Transportation needs:    Medical: Not on file    Non-medical: Not on file  Tobacco Use  . Smoking status: Never Smoker  . Smokeless tobacco: Never Used  Substance and Sexual Activity  . Alcohol use: No  . Drug use: No  . Sexual activity: Never    Birth control/protection: Post-menopausal  Lifestyle  . Physical activity:    Days per week: Not on file    Minutes per session: Not on file  . Stress: Not on file  Relationships  . Social connections:    Talks on phone: Not on file    Gets together: Not on file    Attends religious service: Not on file    Active member of club or organization: Not on file    Attends meetings of clubs or organizations: Not on file    Relationship status: Not on file  . Intimate partner violence:    Fear of current or ex partner: Not on file    Emotionally abused: Not on file    Physically abused: Not on file    Forced sexual activity: Not on file  Other Topics Concern  . Not on file  Social History Narrative   Lives in [place] and lives alone, retired form [employer] work.     Family History  Problem Relation Age of Onset  . Heart attack Father        in his 26's.  . Coronary artery disease Other        younger siblings  . Cancer Maternal Aunt        GYN    ROS: no fevers or chills, productive cough, hemoptysis, dysphasia, odynophagia, melena, hematochezia, dysuria, hematuria, rash, seizure activity, orthopnea, PND, pedal edema, claudication. Remaining systems are negative.  Physical Exam: Well-developed well-nourished in no acute distress.  Skin is warm and dry.  HEENT is normal.  Neck is supple.  Chest is clear to auscultation with normal expansion.  Cardiovascular exam is regular rate and rhythm.  Abdominal exam nontender or distended. No masses  palpated. Extremities show no edema. neuro grossly intact  ECG-sinus bradycardia with PACs, low voltage, right bundle branch block.  Personally reviewed  A/P  1 coronary artery disease-patient denies chest pain.  Plan medical therapy.  Continue aspirin and statin.  2 hypertension-blood pressure is controlled.  Continue present medications and follow.  3 hyperlipidemia-continue statin.  4 carotid artery disease-continue aspirin and statin.  Kirk Ruths, MD

## 2017-07-12 ENCOUNTER — Encounter: Payer: Self-pay | Admitting: Cardiology

## 2017-07-12 ENCOUNTER — Ambulatory Visit (INDEPENDENT_AMBULATORY_CARE_PROVIDER_SITE_OTHER): Payer: Medicare HMO | Admitting: Cardiology

## 2017-07-12 VITALS — BP 139/58 | HR 58 | Ht 59.0 in | Wt 124.0 lb

## 2017-07-12 DIAGNOSIS — I2510 Atherosclerotic heart disease of native coronary artery without angina pectoris: Secondary | ICD-10-CM

## 2017-07-12 DIAGNOSIS — I10 Essential (primary) hypertension: Secondary | ICD-10-CM

## 2017-07-12 DIAGNOSIS — E7800 Pure hypercholesterolemia, unspecified: Secondary | ICD-10-CM | POA: Diagnosis not present

## 2017-07-12 NOTE — Patient Instructions (Signed)
Your physician wants you to follow-up in: ONE YEAR WITH DR CRENSHAW You will receive a reminder letter in the mail two months in advance. If you don't receive a letter, please call our office to schedule the follow-up appointment.   If you need a refill on your cardiac medications before your next appointment, please call your pharmacy.  

## 2017-07-20 ENCOUNTER — Encounter (INDEPENDENT_AMBULATORY_CARE_PROVIDER_SITE_OTHER): Payer: Medicare HMO | Admitting: Ophthalmology

## 2017-07-20 DIAGNOSIS — I10 Essential (primary) hypertension: Secondary | ICD-10-CM | POA: Diagnosis not present

## 2017-07-20 DIAGNOSIS — H43813 Vitreous degeneration, bilateral: Secondary | ICD-10-CM

## 2017-07-20 DIAGNOSIS — H35033 Hypertensive retinopathy, bilateral: Secondary | ICD-10-CM | POA: Diagnosis not present

## 2017-07-20 DIAGNOSIS — E113213 Type 2 diabetes mellitus with mild nonproliferative diabetic retinopathy with macular edema, bilateral: Secondary | ICD-10-CM

## 2017-07-20 DIAGNOSIS — E11311 Type 2 diabetes mellitus with unspecified diabetic retinopathy with macular edema: Secondary | ICD-10-CM | POA: Diagnosis not present

## 2017-07-20 DIAGNOSIS — H353231 Exudative age-related macular degeneration, bilateral, with active choroidal neovascularization: Secondary | ICD-10-CM

## 2017-08-18 ENCOUNTER — Encounter (INDEPENDENT_AMBULATORY_CARE_PROVIDER_SITE_OTHER): Payer: Medicare HMO | Admitting: Ophthalmology

## 2017-08-18 DIAGNOSIS — H353231 Exudative age-related macular degeneration, bilateral, with active choroidal neovascularization: Secondary | ICD-10-CM

## 2017-08-18 DIAGNOSIS — I10 Essential (primary) hypertension: Secondary | ICD-10-CM | POA: Diagnosis not present

## 2017-08-18 DIAGNOSIS — H35033 Hypertensive retinopathy, bilateral: Secondary | ICD-10-CM

## 2017-08-18 DIAGNOSIS — E11311 Type 2 diabetes mellitus with unspecified diabetic retinopathy with macular edema: Secondary | ICD-10-CM

## 2017-08-18 DIAGNOSIS — E113312 Type 2 diabetes mellitus with moderate nonproliferative diabetic retinopathy with macular edema, left eye: Secondary | ICD-10-CM | POA: Diagnosis not present

## 2017-08-18 DIAGNOSIS — E113211 Type 2 diabetes mellitus with mild nonproliferative diabetic retinopathy with macular edema, right eye: Secondary | ICD-10-CM | POA: Diagnosis not present

## 2017-08-18 DIAGNOSIS — H43813 Vitreous degeneration, bilateral: Secondary | ICD-10-CM

## 2017-09-14 DIAGNOSIS — I10 Essential (primary) hypertension: Secondary | ICD-10-CM | POA: Diagnosis not present

## 2017-09-14 DIAGNOSIS — R739 Hyperglycemia, unspecified: Secondary | ICD-10-CM | POA: Diagnosis not present

## 2017-09-15 ENCOUNTER — Encounter (INDEPENDENT_AMBULATORY_CARE_PROVIDER_SITE_OTHER): Payer: Medicare HMO | Admitting: Ophthalmology

## 2017-09-15 DIAGNOSIS — E113291 Type 2 diabetes mellitus with mild nonproliferative diabetic retinopathy without macular edema, right eye: Secondary | ICD-10-CM | POA: Diagnosis not present

## 2017-09-15 DIAGNOSIS — I10 Essential (primary) hypertension: Secondary | ICD-10-CM | POA: Diagnosis not present

## 2017-09-15 DIAGNOSIS — H43813 Vitreous degeneration, bilateral: Secondary | ICD-10-CM | POA: Diagnosis not present

## 2017-09-15 DIAGNOSIS — E11319 Type 2 diabetes mellitus with unspecified diabetic retinopathy without macular edema: Secondary | ICD-10-CM

## 2017-09-15 DIAGNOSIS — H353231 Exudative age-related macular degeneration, bilateral, with active choroidal neovascularization: Secondary | ICD-10-CM | POA: Diagnosis not present

## 2017-09-15 DIAGNOSIS — E113392 Type 2 diabetes mellitus with moderate nonproliferative diabetic retinopathy without macular edema, left eye: Secondary | ICD-10-CM | POA: Diagnosis not present

## 2017-09-15 DIAGNOSIS — H35033 Hypertensive retinopathy, bilateral: Secondary | ICD-10-CM

## 2017-09-20 DIAGNOSIS — R739 Hyperglycemia, unspecified: Secondary | ICD-10-CM | POA: Diagnosis not present

## 2017-09-20 DIAGNOSIS — I10 Essential (primary) hypertension: Secondary | ICD-10-CM | POA: Diagnosis not present

## 2017-09-20 DIAGNOSIS — E7800 Pure hypercholesterolemia, unspecified: Secondary | ICD-10-CM | POA: Diagnosis not present

## 2017-10-13 ENCOUNTER — Encounter (INDEPENDENT_AMBULATORY_CARE_PROVIDER_SITE_OTHER): Payer: Medicare HMO | Admitting: Ophthalmology

## 2017-10-13 DIAGNOSIS — E11319 Type 2 diabetes mellitus with unspecified diabetic retinopathy without macular edema: Secondary | ICD-10-CM

## 2017-10-13 DIAGNOSIS — H353231 Exudative age-related macular degeneration, bilateral, with active choroidal neovascularization: Secondary | ICD-10-CM

## 2017-10-13 DIAGNOSIS — E113393 Type 2 diabetes mellitus with moderate nonproliferative diabetic retinopathy without macular edema, bilateral: Secondary | ICD-10-CM

## 2017-10-13 DIAGNOSIS — H35033 Hypertensive retinopathy, bilateral: Secondary | ICD-10-CM | POA: Diagnosis not present

## 2017-10-13 DIAGNOSIS — I10 Essential (primary) hypertension: Secondary | ICD-10-CM

## 2017-10-13 DIAGNOSIS — H43813 Vitreous degeneration, bilateral: Secondary | ICD-10-CM

## 2017-11-10 ENCOUNTER — Encounter (INDEPENDENT_AMBULATORY_CARE_PROVIDER_SITE_OTHER): Payer: Medicare HMO | Admitting: Ophthalmology

## 2017-11-10 DIAGNOSIS — E11311 Type 2 diabetes mellitus with unspecified diabetic retinopathy with macular edema: Secondary | ICD-10-CM | POA: Diagnosis not present

## 2017-11-10 DIAGNOSIS — I10 Essential (primary) hypertension: Secondary | ICD-10-CM

## 2017-11-10 DIAGNOSIS — H353231 Exudative age-related macular degeneration, bilateral, with active choroidal neovascularization: Secondary | ICD-10-CM | POA: Diagnosis not present

## 2017-11-10 DIAGNOSIS — H35033 Hypertensive retinopathy, bilateral: Secondary | ICD-10-CM | POA: Diagnosis not present

## 2017-11-10 DIAGNOSIS — E113311 Type 2 diabetes mellitus with moderate nonproliferative diabetic retinopathy with macular edema, right eye: Secondary | ICD-10-CM | POA: Diagnosis not present

## 2017-11-10 DIAGNOSIS — E113212 Type 2 diabetes mellitus with mild nonproliferative diabetic retinopathy with macular edema, left eye: Secondary | ICD-10-CM

## 2017-11-10 DIAGNOSIS — H43813 Vitreous degeneration, bilateral: Secondary | ICD-10-CM | POA: Diagnosis not present

## 2017-11-18 DIAGNOSIS — N390 Urinary tract infection, site not specified: Secondary | ICD-10-CM | POA: Diagnosis not present

## 2017-11-18 DIAGNOSIS — N3001 Acute cystitis with hematuria: Secondary | ICD-10-CM | POA: Diagnosis not present

## 2017-12-08 ENCOUNTER — Encounter (INDEPENDENT_AMBULATORY_CARE_PROVIDER_SITE_OTHER): Payer: Medicare HMO | Admitting: Ophthalmology

## 2017-12-08 DIAGNOSIS — I10 Essential (primary) hypertension: Secondary | ICD-10-CM | POA: Diagnosis not present

## 2017-12-08 DIAGNOSIS — E113313 Type 2 diabetes mellitus with moderate nonproliferative diabetic retinopathy with macular edema, bilateral: Secondary | ICD-10-CM

## 2017-12-08 DIAGNOSIS — H353231 Exudative age-related macular degeneration, bilateral, with active choroidal neovascularization: Secondary | ICD-10-CM | POA: Diagnosis not present

## 2017-12-08 DIAGNOSIS — H43813 Vitreous degeneration, bilateral: Secondary | ICD-10-CM

## 2017-12-08 DIAGNOSIS — H35033 Hypertensive retinopathy, bilateral: Secondary | ICD-10-CM

## 2017-12-08 DIAGNOSIS — E11311 Type 2 diabetes mellitus with unspecified diabetic retinopathy with macular edema: Secondary | ICD-10-CM

## 2018-01-05 ENCOUNTER — Encounter (INDEPENDENT_AMBULATORY_CARE_PROVIDER_SITE_OTHER): Payer: Medicare HMO | Admitting: Ophthalmology

## 2018-01-05 DIAGNOSIS — E113292 Type 2 diabetes mellitus with mild nonproliferative diabetic retinopathy without macular edema, left eye: Secondary | ICD-10-CM | POA: Diagnosis not present

## 2018-01-05 DIAGNOSIS — H35033 Hypertensive retinopathy, bilateral: Secondary | ICD-10-CM

## 2018-01-05 DIAGNOSIS — H353231 Exudative age-related macular degeneration, bilateral, with active choroidal neovascularization: Secondary | ICD-10-CM

## 2018-01-05 DIAGNOSIS — E11311 Type 2 diabetes mellitus with unspecified diabetic retinopathy with macular edema: Secondary | ICD-10-CM | POA: Diagnosis not present

## 2018-01-05 DIAGNOSIS — E113311 Type 2 diabetes mellitus with moderate nonproliferative diabetic retinopathy with macular edema, right eye: Secondary | ICD-10-CM

## 2018-01-05 DIAGNOSIS — I10 Essential (primary) hypertension: Secondary | ICD-10-CM | POA: Diagnosis not present

## 2018-01-05 DIAGNOSIS — H43813 Vitreous degeneration, bilateral: Secondary | ICD-10-CM

## 2018-02-02 ENCOUNTER — Encounter (INDEPENDENT_AMBULATORY_CARE_PROVIDER_SITE_OTHER): Payer: Medicare HMO | Admitting: Ophthalmology

## 2018-02-02 DIAGNOSIS — H35033 Hypertensive retinopathy, bilateral: Secondary | ICD-10-CM | POA: Diagnosis not present

## 2018-02-02 DIAGNOSIS — H353231 Exudative age-related macular degeneration, bilateral, with active choroidal neovascularization: Secondary | ICD-10-CM | POA: Diagnosis not present

## 2018-02-02 DIAGNOSIS — H4313 Vitreous hemorrhage, bilateral: Secondary | ICD-10-CM | POA: Diagnosis not present

## 2018-02-02 DIAGNOSIS — E11311 Type 2 diabetes mellitus with unspecified diabetic retinopathy with macular edema: Secondary | ICD-10-CM

## 2018-02-02 DIAGNOSIS — E113313 Type 2 diabetes mellitus with moderate nonproliferative diabetic retinopathy with macular edema, bilateral: Secondary | ICD-10-CM | POA: Diagnosis not present

## 2018-02-02 DIAGNOSIS — I10 Essential (primary) hypertension: Secondary | ICD-10-CM

## 2018-02-09 DIAGNOSIS — R69 Illness, unspecified: Secondary | ICD-10-CM | POA: Diagnosis not present

## 2018-03-02 ENCOUNTER — Encounter (INDEPENDENT_AMBULATORY_CARE_PROVIDER_SITE_OTHER): Payer: Medicare HMO | Admitting: Ophthalmology

## 2018-03-02 DIAGNOSIS — E113292 Type 2 diabetes mellitus with mild nonproliferative diabetic retinopathy without macular edema, left eye: Secondary | ICD-10-CM

## 2018-03-02 DIAGNOSIS — H43813 Vitreous degeneration, bilateral: Secondary | ICD-10-CM | POA: Diagnosis not present

## 2018-03-02 DIAGNOSIS — H353231 Exudative age-related macular degeneration, bilateral, with active choroidal neovascularization: Secondary | ICD-10-CM | POA: Diagnosis not present

## 2018-03-02 DIAGNOSIS — E113391 Type 2 diabetes mellitus with moderate nonproliferative diabetic retinopathy without macular edema, right eye: Secondary | ICD-10-CM | POA: Diagnosis not present

## 2018-03-02 DIAGNOSIS — E11319 Type 2 diabetes mellitus with unspecified diabetic retinopathy without macular edema: Secondary | ICD-10-CM | POA: Diagnosis not present

## 2018-03-02 DIAGNOSIS — I10 Essential (primary) hypertension: Secondary | ICD-10-CM | POA: Diagnosis not present

## 2018-03-02 DIAGNOSIS — H35033 Hypertensive retinopathy, bilateral: Secondary | ICD-10-CM | POA: Diagnosis not present

## 2018-03-14 DIAGNOSIS — R739 Hyperglycemia, unspecified: Secondary | ICD-10-CM | POA: Diagnosis not present

## 2018-03-14 DIAGNOSIS — I10 Essential (primary) hypertension: Secondary | ICD-10-CM | POA: Diagnosis not present

## 2018-03-14 DIAGNOSIS — E7800 Pure hypercholesterolemia, unspecified: Secondary | ICD-10-CM | POA: Diagnosis not present

## 2018-03-23 DIAGNOSIS — I2510 Atherosclerotic heart disease of native coronary artery without angina pectoris: Secondary | ICD-10-CM | POA: Diagnosis not present

## 2018-03-23 DIAGNOSIS — I10 Essential (primary) hypertension: Secondary | ICD-10-CM | POA: Diagnosis not present

## 2018-03-23 DIAGNOSIS — E7800 Pure hypercholesterolemia, unspecified: Secondary | ICD-10-CM | POA: Diagnosis not present

## 2018-03-23 DIAGNOSIS — D61818 Other pancytopenia: Secondary | ICD-10-CM | POA: Diagnosis not present

## 2018-03-23 DIAGNOSIS — I340 Nonrheumatic mitral (valve) insufficiency: Secondary | ICD-10-CM | POA: Diagnosis not present

## 2018-03-23 DIAGNOSIS — R739 Hyperglycemia, unspecified: Secondary | ICD-10-CM | POA: Diagnosis not present

## 2018-03-23 DIAGNOSIS — E11319 Type 2 diabetes mellitus with unspecified diabetic retinopathy without macular edema: Secondary | ICD-10-CM | POA: Diagnosis not present

## 2018-03-23 DIAGNOSIS — I6521 Occlusion and stenosis of right carotid artery: Secondary | ICD-10-CM | POA: Diagnosis not present

## 2018-03-30 ENCOUNTER — Encounter (INDEPENDENT_AMBULATORY_CARE_PROVIDER_SITE_OTHER): Payer: Medicare HMO | Admitting: Ophthalmology

## 2018-03-30 DIAGNOSIS — E113212 Type 2 diabetes mellitus with mild nonproliferative diabetic retinopathy with macular edema, left eye: Secondary | ICD-10-CM

## 2018-03-30 DIAGNOSIS — H35033 Hypertensive retinopathy, bilateral: Secondary | ICD-10-CM

## 2018-03-30 DIAGNOSIS — H353231 Exudative age-related macular degeneration, bilateral, with active choroidal neovascularization: Secondary | ICD-10-CM

## 2018-03-30 DIAGNOSIS — E11311 Type 2 diabetes mellitus with unspecified diabetic retinopathy with macular edema: Secondary | ICD-10-CM | POA: Diagnosis not present

## 2018-03-30 DIAGNOSIS — H43813 Vitreous degeneration, bilateral: Secondary | ICD-10-CM | POA: Diagnosis not present

## 2018-03-30 DIAGNOSIS — I10 Essential (primary) hypertension: Secondary | ICD-10-CM

## 2018-03-30 DIAGNOSIS — E113311 Type 2 diabetes mellitus with moderate nonproliferative diabetic retinopathy with macular edema, right eye: Secondary | ICD-10-CM

## 2018-04-27 ENCOUNTER — Encounter (INDEPENDENT_AMBULATORY_CARE_PROVIDER_SITE_OTHER): Payer: Medicare HMO | Admitting: Ophthalmology

## 2018-04-27 DIAGNOSIS — H353231 Exudative age-related macular degeneration, bilateral, with active choroidal neovascularization: Secondary | ICD-10-CM

## 2018-04-27 DIAGNOSIS — H43813 Vitreous degeneration, bilateral: Secondary | ICD-10-CM | POA: Diagnosis not present

## 2018-04-27 DIAGNOSIS — E113213 Type 2 diabetes mellitus with mild nonproliferative diabetic retinopathy with macular edema, bilateral: Secondary | ICD-10-CM

## 2018-04-27 DIAGNOSIS — E11311 Type 2 diabetes mellitus with unspecified diabetic retinopathy with macular edema: Secondary | ICD-10-CM

## 2018-04-27 DIAGNOSIS — I10 Essential (primary) hypertension: Secondary | ICD-10-CM | POA: Diagnosis not present

## 2018-04-27 DIAGNOSIS — H35033 Hypertensive retinopathy, bilateral: Secondary | ICD-10-CM

## 2018-05-14 ENCOUNTER — Other Ambulatory Visit: Payer: Self-pay | Admitting: Cardiology

## 2018-05-14 DIAGNOSIS — I2510 Atherosclerotic heart disease of native coronary artery without angina pectoris: Secondary | ICD-10-CM

## 2018-05-22 DIAGNOSIS — Z7984 Long term (current) use of oral hypoglycemic drugs: Secondary | ICD-10-CM | POA: Diagnosis not present

## 2018-05-22 DIAGNOSIS — H547 Unspecified visual loss: Secondary | ICD-10-CM | POA: Diagnosis not present

## 2018-05-22 DIAGNOSIS — M810 Age-related osteoporosis without current pathological fracture: Secondary | ICD-10-CM | POA: Diagnosis not present

## 2018-05-22 DIAGNOSIS — N183 Chronic kidney disease, stage 3 (moderate): Secondary | ICD-10-CM | POA: Diagnosis not present

## 2018-05-22 DIAGNOSIS — M109 Gout, unspecified: Secondary | ICD-10-CM | POA: Diagnosis not present

## 2018-05-22 DIAGNOSIS — I129 Hypertensive chronic kidney disease with stage 1 through stage 4 chronic kidney disease, or unspecified chronic kidney disease: Secondary | ICD-10-CM | POA: Diagnosis not present

## 2018-05-22 DIAGNOSIS — E1122 Type 2 diabetes mellitus with diabetic chronic kidney disease: Secondary | ICD-10-CM | POA: Diagnosis not present

## 2018-05-22 DIAGNOSIS — Z8249 Family history of ischemic heart disease and other diseases of the circulatory system: Secondary | ICD-10-CM | POA: Diagnosis not present

## 2018-05-22 DIAGNOSIS — E785 Hyperlipidemia, unspecified: Secondary | ICD-10-CM | POA: Diagnosis not present

## 2018-05-22 DIAGNOSIS — Z7982 Long term (current) use of aspirin: Secondary | ICD-10-CM | POA: Diagnosis not present

## 2018-05-25 ENCOUNTER — Encounter (INDEPENDENT_AMBULATORY_CARE_PROVIDER_SITE_OTHER): Payer: Medicare HMO | Admitting: Ophthalmology

## 2018-05-25 DIAGNOSIS — H353231 Exudative age-related macular degeneration, bilateral, with active choroidal neovascularization: Secondary | ICD-10-CM

## 2018-05-25 DIAGNOSIS — H35033 Hypertensive retinopathy, bilateral: Secondary | ICD-10-CM

## 2018-05-25 DIAGNOSIS — E113213 Type 2 diabetes mellitus with mild nonproliferative diabetic retinopathy with macular edema, bilateral: Secondary | ICD-10-CM | POA: Diagnosis not present

## 2018-05-25 DIAGNOSIS — I10 Essential (primary) hypertension: Secondary | ICD-10-CM

## 2018-05-25 DIAGNOSIS — E11311 Type 2 diabetes mellitus with unspecified diabetic retinopathy with macular edema: Secondary | ICD-10-CM | POA: Diagnosis not present

## 2018-05-25 DIAGNOSIS — H43813 Vitreous degeneration, bilateral: Secondary | ICD-10-CM

## 2018-06-20 NOTE — Progress Notes (Signed)
HPI: FU CAD; seen in the past for chest pain and near syncope. An echocardiogram performed in March of 2011showednormalEF. There was mild aortic and mitral regurgitation. A CardioNet monitor was performed in November of 2011 related to syncope and revealed sinus with pacs. LHC 06/30/11: LAD diffuse 40-50%, circumflex irregular with less than 10% stenosis, mid RCA 50-60%, EF 55-65%. Medical therapy was recommended. Carotid dopplers January 2016 showed no significant stenosis. Since she was last seen, she denies dyspnea, chest pain, palpitations or syncope.  Current Outpatient Medications  Medication Sig Dispense Refill  . allopurinol (ZYLOPRIM) 100 MG tablet Take 50 mg by mouth daily.     Marland Kitchen aspirin 81 MG tablet Take 81 mg by mouth daily.      . Bevacizumab (AVASTIN IV) Inject into the vein. Injection in left eye. Every 4 weeks.    . bisoprolol (ZEBETA) 5 MG tablet TAKE 1/2 TABLET (2.5 MG TOTAL) BY MOUTH 2 (TWO) TIMES DAILY. 90 tablet 0  . Calcium Carbonate (CALTRATE 600 PO) Take 1 tablet by mouth daily.    . diazepam (VALIUM) 5 MG tablet Take 5 mg by mouth as needed (as needed for eye injection).     . Iron-Vitamins (GERITOL COMPLETE) TABS Take by mouth daily.      Marland Kitchen lisinopril (PRINIVIL,ZESTRIL) 20 MG tablet TAKE 1 TABLET (20 MG TOTAL) BY MOUTH DAILY. 90 tablet 0  . moxifloxacin (VIGAMOX) 0.5 % ophthalmic solution 1 drop as directed. Takes with injection in the eye    . Multiple Vitamins-Minerals (CENTRUM SILVER ULTRA MENS) TABS Take by mouth daily.      . Multiple Vitamins-Minerals (OCUVITE PRESERVISION) TABS Take by mouth daily.      . Nutritional Supplements (VITAMIN D MAINTENANCE PO) Take 1 tablet by mouth daily.    . pioglitazone (ACTOS) 15 MG tablet Take 2 tablets (30 mg total) by mouth daily. SEE COMMENT    . Polyethyl Glycol-Propyl Glycol (SYSTANE) 0.4-0.3 % SOLN Apply to eye as directed.      . simvastatin (ZOCOR) 40 MG tablet Take 40 mg by mouth daily.    . traMADol (ULTRAM)  50 MG tablet Take 1 tablet (50 mg total) by mouth every 12 (twelve) hours as needed for severe pain. 10 tablet 0   No current facility-administered medications for this visit.      Past Medical History:  Diagnosis Date  . Anemia    Felt secondary renal insuffiency, seen by Dr. Ralene Ok - Workup in the past has included normal iron, folate, B12, serum and urine protein electrophoresis as well as an ANA. Peripheral blood smear had been normal  . Carotid arterial disease ([place])    Last dopplers 08/537 - RICA 76-73%, LICA 4-19% for recheck in 1 year  . Coronary artery disease    Moderate nonobstructive disease by cath 06/2011 (ruled out for MI/PE at that time)  . Dyslipidemia   . Esophageal reflux   . Gout   . Macular degeneration (senile) of retina, unspecified   . Osteoporosis   . Other and unspecified hyperlipidemia   . Pancytopenia 2010   Intermittent mild leukopenia and thrombocytopenia felt to be most likely due to immune dysregulation per Dr. Ralene Ok  . Type II or unspecified type diabetes mellitus without mention of complication, not stated as uncontrolled   . Unspecified essential hypertension     Past Surgical History:  Procedure Laterality Date  . abdominal cyst    . ABDOMINAL HYSTERECTOMY     cyst removal  .  APPENDECTOMY     age 30  . LEFT HEART CATHETERIZATION WITH CORONARY ANGIOGRAM N/A 06/30/2011   Procedure: LEFT HEART CATHETERIZATION WITH CORONARY ANGIOGRAM;  Surgeon: Peter M Martinique, MD;  Location: [facility] CATH LAB;  Service: Cardiovascular;  Laterality: N/A;    Social History   Socioeconomic History  . Marital status: Widowed    Spouse name: Not on file  . Number of children: Not on file  . Years of education: Not on file  . Highest education level: Not on file  Occupational History  . Not on file  Social Needs  . Financial resource strain: Not on file  . Food insecurity:    Worry: Not on file    Inability: Not on file  . Transportation needs:    Medical:  Not on file    Non-medical: Not on file  Tobacco Use  . Smoking status: Never Smoker  . Smokeless tobacco: Never Used  Substance and Sexual Activity  . Alcohol use: No  . Drug use: No  . Sexual activity: Never    Birth control/protection: Post-menopausal  Lifestyle  . Physical activity:    Days per week: Not on file    Minutes per session: Not on file  . Stress: Not on file  Relationships  . Social connections:    Talks on phone: Not on file    Gets together: Not on file    Attends religious service: Not on file    Active member of club or organization: Not on file    Attends meetings of clubs or organizations: Not on file    Relationship status: Not on file  . Intimate partner violence:    Fear of current or ex partner: Not on file    Emotionally abused: Not on file    Physically abused: Not on file    Forced sexual activity: Not on file  Other Topics Concern  . Not on file  Social History Narrative   Lives in [place] and lives alone, retired form [employer] work.     Family History  Problem Relation Age of Onset  . Heart attack Father        in his 88's.  . Coronary artery disease Other        younger siblings  . Cancer Maternal Aunt        GYN    ROS: no fevers or chills, productive cough, hemoptysis, dysphasia, odynophagia, melena, hematochezia, dysuria, hematuria, rash, seizure activity, orthopnea, PND, pedal edema, claudication. Remaining systems are negative.  Physical Exam: Well-developed well-nourished in no acute distress.  Skin is warm and dry.  HEENT is normal.  Neck is supple.  Chest is clear to auscultation with normal expansion.  Cardiovascular exam is regular rate and rhythm.  2/6 systolic ejection murmur Abdominal exam nontender or distended. No masses palpated. Extremities show no edema. neuro grossly intact  ECG-sinus rhythm at a rate of 54, right bundle branch block.  Personally reviewed  A/P  1 coronary artery disease-there is no  chest pain.  Plan to continue medical therapy with aspirin and statin.  2 carotid artery disease-continue aspirin and statin.  3 hypertension-patient's blood pressure is controlled.  Continue present medications.  K and renal function monitored by primary care.  4 hyperlipidemia-continue statin.  Lipids and liver monitored by primary care.  Kirk Ruths, MD

## 2018-06-22 ENCOUNTER — Encounter (INDEPENDENT_AMBULATORY_CARE_PROVIDER_SITE_OTHER): Payer: Medicare HMO | Admitting: Ophthalmology

## 2018-06-22 DIAGNOSIS — H35033 Hypertensive retinopathy, bilateral: Secondary | ICD-10-CM | POA: Diagnosis not present

## 2018-06-22 DIAGNOSIS — E113291 Type 2 diabetes mellitus with mild nonproliferative diabetic retinopathy without macular edema, right eye: Secondary | ICD-10-CM

## 2018-06-22 DIAGNOSIS — I10 Essential (primary) hypertension: Secondary | ICD-10-CM

## 2018-06-22 DIAGNOSIS — H43813 Vitreous degeneration, bilateral: Secondary | ICD-10-CM

## 2018-06-22 DIAGNOSIS — E11319 Type 2 diabetes mellitus with unspecified diabetic retinopathy without macular edema: Secondary | ICD-10-CM | POA: Diagnosis not present

## 2018-06-22 DIAGNOSIS — E113392 Type 2 diabetes mellitus with moderate nonproliferative diabetic retinopathy without macular edema, left eye: Secondary | ICD-10-CM | POA: Diagnosis not present

## 2018-06-22 DIAGNOSIS — H353231 Exudative age-related macular degeneration, bilateral, with active choroidal neovascularization: Secondary | ICD-10-CM | POA: Diagnosis not present

## 2018-06-23 ENCOUNTER — Ambulatory Visit: Payer: Medicare HMO | Admitting: Cardiology

## 2018-06-23 ENCOUNTER — Encounter: Payer: Self-pay | Admitting: Cardiology

## 2018-06-23 VITALS — BP 144/60 | HR 54 | Ht 59.0 in | Wt 126.0 lb

## 2018-06-23 DIAGNOSIS — L570 Actinic keratosis: Secondary | ICD-10-CM | POA: Diagnosis not present

## 2018-06-23 DIAGNOSIS — E7800 Pure hypercholesterolemia, unspecified: Secondary | ICD-10-CM

## 2018-06-23 DIAGNOSIS — I2510 Atherosclerotic heart disease of native coronary artery without angina pectoris: Secondary | ICD-10-CM

## 2018-06-23 DIAGNOSIS — Z85820 Personal history of malignant melanoma of skin: Secondary | ICD-10-CM | POA: Diagnosis not present

## 2018-06-23 DIAGNOSIS — L821 Other seborrheic keratosis: Secondary | ICD-10-CM | POA: Diagnosis not present

## 2018-06-23 DIAGNOSIS — L304 Erythema intertrigo: Secondary | ICD-10-CM | POA: Diagnosis not present

## 2018-06-23 DIAGNOSIS — I10 Essential (primary) hypertension: Secondary | ICD-10-CM

## 2018-06-23 NOTE — Patient Instructions (Signed)

## 2018-06-27 DIAGNOSIS — K5900 Constipation, unspecified: Secondary | ICD-10-CM | POA: Diagnosis not present

## 2018-06-27 DIAGNOSIS — I10 Essential (primary) hypertension: Secondary | ICD-10-CM | POA: Diagnosis not present

## 2018-07-20 ENCOUNTER — Encounter (INDEPENDENT_AMBULATORY_CARE_PROVIDER_SITE_OTHER): Payer: Medicare HMO | Admitting: Ophthalmology

## 2018-07-20 ENCOUNTER — Other Ambulatory Visit: Payer: Self-pay

## 2018-07-20 DIAGNOSIS — E113312 Type 2 diabetes mellitus with moderate nonproliferative diabetic retinopathy with macular edema, left eye: Secondary | ICD-10-CM | POA: Diagnosis not present

## 2018-07-20 DIAGNOSIS — H353231 Exudative age-related macular degeneration, bilateral, with active choroidal neovascularization: Secondary | ICD-10-CM

## 2018-07-20 DIAGNOSIS — E11311 Type 2 diabetes mellitus with unspecified diabetic retinopathy with macular edema: Secondary | ICD-10-CM

## 2018-07-20 DIAGNOSIS — I10 Essential (primary) hypertension: Secondary | ICD-10-CM

## 2018-07-20 DIAGNOSIS — E113211 Type 2 diabetes mellitus with mild nonproliferative diabetic retinopathy with macular edema, right eye: Secondary | ICD-10-CM | POA: Diagnosis not present

## 2018-07-20 DIAGNOSIS — H43813 Vitreous degeneration, bilateral: Secondary | ICD-10-CM | POA: Diagnosis not present

## 2018-07-20 DIAGNOSIS — H35033 Hypertensive retinopathy, bilateral: Secondary | ICD-10-CM | POA: Diagnosis not present

## 2018-08-17 ENCOUNTER — Other Ambulatory Visit: Payer: Self-pay

## 2018-08-17 ENCOUNTER — Encounter (INDEPENDENT_AMBULATORY_CARE_PROVIDER_SITE_OTHER): Payer: Medicare HMO | Admitting: Ophthalmology

## 2018-08-17 DIAGNOSIS — E11311 Type 2 diabetes mellitus with unspecified diabetic retinopathy with macular edema: Secondary | ICD-10-CM

## 2018-08-17 DIAGNOSIS — E113291 Type 2 diabetes mellitus with mild nonproliferative diabetic retinopathy without macular edema, right eye: Secondary | ICD-10-CM | POA: Diagnosis not present

## 2018-08-17 DIAGNOSIS — I10 Essential (primary) hypertension: Secondary | ICD-10-CM

## 2018-08-17 DIAGNOSIS — H35033 Hypertensive retinopathy, bilateral: Secondary | ICD-10-CM

## 2018-08-17 DIAGNOSIS — H43813 Vitreous degeneration, bilateral: Secondary | ICD-10-CM | POA: Diagnosis not present

## 2018-08-17 DIAGNOSIS — H353231 Exudative age-related macular degeneration, bilateral, with active choroidal neovascularization: Secondary | ICD-10-CM

## 2018-08-17 DIAGNOSIS — E113313 Type 2 diabetes mellitus with moderate nonproliferative diabetic retinopathy with macular edema, bilateral: Secondary | ICD-10-CM

## 2018-08-24 DIAGNOSIS — R69 Illness, unspecified: Secondary | ICD-10-CM | POA: Diagnosis not present

## 2018-08-25 DIAGNOSIS — R69 Illness, unspecified: Secondary | ICD-10-CM | POA: Diagnosis not present

## 2018-09-14 ENCOUNTER — Encounter (INDEPENDENT_AMBULATORY_CARE_PROVIDER_SITE_OTHER): Payer: Medicare HMO | Admitting: Ophthalmology

## 2018-09-14 ENCOUNTER — Other Ambulatory Visit: Payer: Self-pay

## 2018-09-14 DIAGNOSIS — E113313 Type 2 diabetes mellitus with moderate nonproliferative diabetic retinopathy with macular edema, bilateral: Secondary | ICD-10-CM | POA: Diagnosis not present

## 2018-09-14 DIAGNOSIS — H43813 Vitreous degeneration, bilateral: Secondary | ICD-10-CM | POA: Diagnosis not present

## 2018-09-14 DIAGNOSIS — H353231 Exudative age-related macular degeneration, bilateral, with active choroidal neovascularization: Secondary | ICD-10-CM | POA: Diagnosis not present

## 2018-09-14 DIAGNOSIS — I10 Essential (primary) hypertension: Secondary | ICD-10-CM

## 2018-09-14 DIAGNOSIS — E11311 Type 2 diabetes mellitus with unspecified diabetic retinopathy with macular edema: Secondary | ICD-10-CM | POA: Diagnosis not present

## 2018-09-14 DIAGNOSIS — H35033 Hypertensive retinopathy, bilateral: Secondary | ICD-10-CM

## 2018-09-18 DIAGNOSIS — N390 Urinary tract infection, site not specified: Secondary | ICD-10-CM | POA: Diagnosis not present

## 2018-09-18 DIAGNOSIS — E7800 Pure hypercholesterolemia, unspecified: Secondary | ICD-10-CM | POA: Diagnosis not present

## 2018-09-18 DIAGNOSIS — R739 Hyperglycemia, unspecified: Secondary | ICD-10-CM | POA: Diagnosis not present

## 2018-09-18 DIAGNOSIS — I10 Essential (primary) hypertension: Secondary | ICD-10-CM | POA: Diagnosis not present

## 2018-09-25 ENCOUNTER — Other Ambulatory Visit: Payer: Self-pay | Admitting: Cardiology

## 2018-09-25 DIAGNOSIS — E7800 Pure hypercholesterolemia, unspecified: Secondary | ICD-10-CM | POA: Diagnosis not present

## 2018-09-25 DIAGNOSIS — K5900 Constipation, unspecified: Secondary | ICD-10-CM | POA: Diagnosis not present

## 2018-09-25 DIAGNOSIS — I10 Essential (primary) hypertension: Secondary | ICD-10-CM | POA: Diagnosis not present

## 2018-09-25 DIAGNOSIS — D61818 Other pancytopenia: Secondary | ICD-10-CM | POA: Diagnosis not present

## 2018-09-25 DIAGNOSIS — E11319 Type 2 diabetes mellitus with unspecified diabetic retinopathy without macular edema: Secondary | ICD-10-CM | POA: Diagnosis not present

## 2018-09-25 DIAGNOSIS — I2510 Atherosclerotic heart disease of native coronary artery without angina pectoris: Secondary | ICD-10-CM

## 2018-09-25 DIAGNOSIS — N189 Chronic kidney disease, unspecified: Secondary | ICD-10-CM | POA: Diagnosis not present

## 2018-09-25 DIAGNOSIS — R739 Hyperglycemia, unspecified: Secondary | ICD-10-CM | POA: Diagnosis not present

## 2018-09-25 DIAGNOSIS — I340 Nonrheumatic mitral (valve) insufficiency: Secondary | ICD-10-CM | POA: Diagnosis not present

## 2018-09-25 DIAGNOSIS — I6521 Occlusion and stenosis of right carotid artery: Secondary | ICD-10-CM | POA: Diagnosis not present

## 2018-10-03 DIAGNOSIS — H353232 Exudative age-related macular degeneration, bilateral, with inactive choroidal neovascularization: Secondary | ICD-10-CM | POA: Diagnosis not present

## 2018-10-03 DIAGNOSIS — H401122 Primary open-angle glaucoma, left eye, moderate stage: Secondary | ICD-10-CM | POA: Diagnosis not present

## 2018-10-12 ENCOUNTER — Other Ambulatory Visit: Payer: Self-pay

## 2018-10-12 ENCOUNTER — Encounter (INDEPENDENT_AMBULATORY_CARE_PROVIDER_SITE_OTHER): Payer: Medicare HMO | Admitting: Ophthalmology

## 2018-10-12 DIAGNOSIS — H43813 Vitreous degeneration, bilateral: Secondary | ICD-10-CM

## 2018-10-12 DIAGNOSIS — H353231 Exudative age-related macular degeneration, bilateral, with active choroidal neovascularization: Secondary | ICD-10-CM

## 2018-10-12 DIAGNOSIS — E113293 Type 2 diabetes mellitus with mild nonproliferative diabetic retinopathy without macular edema, bilateral: Secondary | ICD-10-CM | POA: Diagnosis not present

## 2018-10-12 DIAGNOSIS — E11319 Type 2 diabetes mellitus with unspecified diabetic retinopathy without macular edema: Secondary | ICD-10-CM

## 2018-10-12 DIAGNOSIS — H35033 Hypertensive retinopathy, bilateral: Secondary | ICD-10-CM

## 2018-10-12 DIAGNOSIS — I10 Essential (primary) hypertension: Secondary | ICD-10-CM

## 2018-11-09 ENCOUNTER — Encounter (HOSPITAL_COMMUNITY): Payer: Self-pay | Admitting: Emergency Medicine

## 2018-11-09 ENCOUNTER — Emergency Department (HOSPITAL_COMMUNITY)
Admission: EM | Admit: 2018-11-09 | Discharge: 2018-11-09 | Disposition: A | Discharge status: Home / Self Care | Payer: Medicare HMO | Attending: Emergency Medicine | Admitting: Emergency Medicine

## 2018-11-09 ENCOUNTER — Other Ambulatory Visit: Payer: Self-pay

## 2018-11-09 ENCOUNTER — Encounter (INDEPENDENT_AMBULATORY_CARE_PROVIDER_SITE_OTHER): Payer: Medicare HMO | Admitting: Ophthalmology

## 2018-11-09 ENCOUNTER — Emergency Department (HOSPITAL_COMMUNITY): Payer: Medicare HMO

## 2018-11-09 DIAGNOSIS — Z7982 Long term (current) use of aspirin: Secondary | ICD-10-CM | POA: Insufficient documentation

## 2018-11-09 DIAGNOSIS — I10 Essential (primary) hypertension: Secondary | ICD-10-CM | POA: Diagnosis not present

## 2018-11-09 DIAGNOSIS — R0789 Other chest pain: Secondary | ICD-10-CM

## 2018-11-09 DIAGNOSIS — E119 Type 2 diabetes mellitus without complications: Secondary | ICD-10-CM | POA: Diagnosis not present

## 2018-11-09 DIAGNOSIS — Z79899 Other long term (current) drug therapy: Secondary | ICD-10-CM | POA: Insufficient documentation

## 2018-11-09 DIAGNOSIS — I2510 Atherosclerotic heart disease of native coronary artery without angina pectoris: Secondary | ICD-10-CM | POA: Diagnosis not present

## 2018-11-09 DIAGNOSIS — R079 Chest pain, unspecified: Secondary | ICD-10-CM | POA: Diagnosis not present

## 2018-11-09 LAB — TROPONIN I (HIGH SENSITIVITY)
Troponin I (High Sensitivity): 7 ng/L (ref <18)
Troponin I (High Sensitivity): 8 ng/L (ref <18)

## 2018-11-09 LAB — CBC
HCT: 34.2 % — ABNORMAL LOW (ref 36.0–46.0)
Hemoglobin: 10.6 g/dL — ABNORMAL LOW (ref 12.0–15.0)
MCH: 28.7 pg (ref 26.0–34.0)
MCHC: 31 g/dL (ref 30.0–36.0)
MCV: 92.7 fL (ref 80.0–100.0)
Platelets: 141 10*3/uL — ABNORMAL LOW (ref 150–400)
RBC: 3.69 MIL/uL — ABNORMAL LOW (ref 3.87–5.11)
RDW: 14.5 % (ref 11.5–15.5)
WBC: 3.2 10*3/uL — ABNORMAL LOW (ref 4.0–10.5)
nRBC: 0 % (ref 0.0–0.2)

## 2018-11-09 LAB — BASIC METABOLIC PANEL
Anion gap: 8 (ref 5–15)
BUN: 24 mg/dL — ABNORMAL HIGH (ref 8–23)
CO2: 29 mmol/L (ref 22–32)
Calcium: 9.9 mg/dL (ref 8.9–10.3)
Chloride: 103 mmol/L (ref 98–111)
Creatinine, Ser: 1.37 mg/dL — ABNORMAL HIGH (ref 0.44–1.00)
GFR calc Af Amer: 40 mL/min — ABNORMAL LOW (ref >60)
GFR calc non Af Amer: 35 mL/min — ABNORMAL LOW (ref >60)
Glucose, Bld: 97 mg/dL (ref 70–99)
Potassium: 4.6 mmol/L (ref 3.5–5.1)
Sodium: 140 mmol/L (ref 135–145)

## 2018-11-09 MED ORDER — SODIUM CHLORIDE 0.9% FLUSH: 3.0000 mL | Freq: Once | INTRAVENOUS | Status: DC

## 2018-11-09 NOTE — ED Triage Notes (Signed)
Onset chest pain today at 0715 4/10 pressure denies shortness of breath. Stated took aspirin 325 mg at 0720 and 0800 total of 650 mg.

## 2018-11-09 NOTE — ED Provider Notes (Signed)
[facility] EMERGENCY DEPARTMENT Provider Note   CSN: [id number] Arrival date & time: 11/09/18  8563    History   Chief Complaint Chief Complaint  Patient presents with  . Chest Pain    HPI Patricia Moran is a 85 y.o. female with a PMH of CAD, T2DM, HTN, HLD, macular degeneration, and esophageal reflux presenting with constant left sided chest pain onset today at 7:45am. Patient describes pain as a pressure and states it is non radiating. Patient states nothing makes symptoms worse. Patient reports she took aspirin prior to arrival and symptoms have improved. Patient states she has had similar episodes in the past, but symptoms typically completely resolve after taking aspirin. Patient denies trauma, shortness of breath, nausea, vomiting, abdominal pain, or recent travel. Patient denies fever, cough, congestion, or sick exposures. Patient states she had similar symptoms in 2013 and was evaluated by Dr. Stanford Breed. Per chart review, echo performed in 2011 reveals LVEF was 55-60%. Patient also had a left heart catheterization with coronary angiogram in 2013 that revealed moderate nonobstructive disease. Patient denies tobacco, alcohol, or drug use. Patient states father had an MI at age 9.     HPI  Past Medical History:  Diagnosis Date  . Anemia    Felt secondary renal insuffiency, seen by Dr. Ralene Ok - Workup in the past has included normal iron, folate, B12, serum and urine protein electrophoresis as well as an Dameon Soltis. Peripheral blood smear had been normal  . Carotid arterial disease ([place])    Last dopplers 04/4968 - RICA 26-37%, LICA 8-58% for recheck in 1 year  . Coronary artery disease    Moderate nonobstructive disease by cath 06/2011 (ruled out for MI/PE at that time)  . Dyslipidemia   . Esophageal reflux   . Gout   . Macular degeneration (senile) of retina, unspecified   . Osteoporosis   . Other and unspecified hyperlipidemia   . Pancytopenia 2010   Intermittent  mild leukopenia and thrombocytopenia felt to be most likely due to immune dysregulation per Dr. Ralene Ok  . Type II or unspecified type diabetes mellitus without mention of complication, not stated as uncontrolled   . Unspecified essential hypertension     Patient Active Problem List   Diagnosis Date Noted  . CAD (coronary artery disease) 10/08/2011  . Chest pain 07/01/2011  . Anemia 06/30/2011  . Anemia in chronic kidney disease(285.21) 06/15/2011  . Pancytopenia 06/15/2011  . SYNCOPE 08/12/2009  . MITRAL REGURGITATION 12/25/2008  . CAROTID STENOSIS 12/25/2008  . DIABETES MELLITUS, TYPE II 07/23/2008  . HYPERLIPIDEMIA 07/23/2008  . MACULAR DEGENERATION 07/23/2008  . Essential hypertension 07/23/2008  . GASTROESOPHAGEAL REFLUX DISEASE 07/23/2008  . OSTEOPOROSIS 07/23/2008  . CHEST PAIN, ATYPICAL 07/23/2008  . GOUT, HX OF 07/23/2008    Past Surgical History:  Procedure Laterality Date  . abdominal cyst    . ABDOMINAL HYSTERECTOMY     cyst removal  . APPENDECTOMY     age 54  . LEFT HEART CATHETERIZATION WITH CORONARY ANGIOGRAM N/A 06/30/2011   Procedure: LEFT HEART CATHETERIZATION WITH CORONARY ANGIOGRAM;  Surgeon: Peter M Martinique, MD;  Location: [facility] CATH LAB;  Service: Cardiovascular;  Laterality: N/A;     OB History   No obstetric history on file.      Home Medications    Prior to Admission medications   Medication Sig Start Date End Date Taking? Authorizing Provider  allopurinol (ZYLOPRIM) 100 MG tablet Take 50 mg by mouth daily.    Yes [provider]  aspirin 81 MG tablet Take 81 mg by mouth daily.     Yes [provider]  bisoprolol (ZEBETA) 5 MG tablet TAKE 1/2 TABLET (2.5 MG TOTAL) BY MOUTH 2 (TWO) TIMES DAILY. Patient taking differently: Take 2.5 mg by mouth 2 (two) times a day.  03/02/13  Yes Lelon Perla, MD  Calcium Carbonate (CALTRATE 600 PO) Take 100 mg by mouth at bedtime.    Yes [provider]  diazepam (VALIUM) 5 MG  tablet Take 5 mg by mouth as needed (as needed for eye injection).    Yes [provider]  Iron-Vitamins (GERITOL COMPLETE) TABS Take 1 tablet by mouth daily.    Yes [provider]  lisinopril (ZESTRIL) 20 MG tablet TAKE 1 TABLET BY MOUTH EVERY DAY Patient taking differently: Take 15 mg by mouth daily.  09/25/18  Yes Lelon Perla, MD  moxifloxacin (VIGAMOX) 0.5 % ophthalmic solution Place 1 drop into both eyes as needed. Takes with injection in the eye   Yes [provider]  Multiple Vitamins-Minerals (CENTRUM SILVER ULTRA MENS) TABS Take 1 tablet by mouth daily.    Yes [provider]  Multiple Vitamins-Minerals (OCUVITE PRESERVISION) TABS Take 1 tablet by mouth 2 (two) times a day.    Yes [provider]  pioglitazone (ACTOS) 15 MG tablet Take 2 tablets (30 mg total) by mouth daily. SEE COMMENT Patient taking differently: Take 15 mg by mouth daily.  07/01/11  Yes Dunn, Dayna N, PA-C  Polyethyl Glycol-Propyl Glycol (SYSTANE) 0.4-0.3 % SOLN Place 1 drop into the right eye daily.    Yes [provider]  simvastatin (ZOCOR) 40 MG tablet Take 40 mg by mouth at bedtime.    Yes [provider]  Bevacizumab (AVASTIN IV) Inject into the vein. Injection in left eye. Every 4 weeks.    [provider]  traMADol (ULTRAM) 50 MG tablet Take 1 tablet (50 mg total) by mouth every 12 (twelve) hours as needed for severe pain. Patient not taking: Reported on 11/09/2018 05/13/17   Renita Papa, PA-C    Family History Family History  Problem Relation Age of Onset  . Heart attack Father        in his 26's.  . Coronary artery disease Other        younger siblings  . Cancer Maternal Aunt        GYN    Social History Social History   Tobacco Use  . Smoking status: Never Smoker  . Smokeless tobacco: Never Used  Substance Use Topics  . Alcohol use: No  . Drug use: No     Allergies   Hydrocodone, Nitroglycerin, Ofloxacin, and  Sulfonamide derivatives   Review of Systems Review of Systems  Constitutional: Negative for activity change, appetite change, chills, diaphoresis, fatigue, fever and unexpected weight change.  HENT: Negative for congestion and rhinorrhea.   Eyes: Negative for pain and visual disturbance.  Respiratory: Negative for cough, chest tightness, shortness of breath and wheezing.   Cardiovascular: Positive for chest pain. Negative for palpitations and leg swelling.  Gastrointestinal: Negative for abdominal pain, nausea and vomiting.  Endocrine: Negative for cold intolerance and heat intolerance.  Genitourinary: Negative for dysuria.  Musculoskeletal: Negative for back pain.  Skin: Negative for rash.  Allergic/Immunologic: Negative for immunocompromised state.  Neurological: Negative for dizziness, syncope, weakness and light-headedness.  Psychiatric/Behavioral: Negative for agitation and behavioral problems. The patient is not nervous/anxious.     Physical Exam Updated  Vital Signs BP (!) 157/48   Pulse (!) 53   Temp 97.9 F (36.6 C) (Oral)   Resp 15   Ht 5' (1.524 m)   Wt 58 kg   SpO2 100%   BMI 24.97 kg/m   Physical Exam Vitals signs and nursing note reviewed.  Constitutional:      General: She is not in acute distress.    Appearance: She is well-developed. She is not diaphoretic.  HENT:     Head: Normocephalic and atraumatic.  Neck:     Musculoskeletal: Normal range of motion and neck supple.     Vascular: No JVD.  Cardiovascular:     Rate and Rhythm: Normal rate and regular rhythm.     Pulses: Normal pulses.          Radial pulses are 2+ on the right side and 2+ on the left side.       Dorsalis pedis pulses are 2+ on the right side and 2+ on the left side.     Heart sounds: Normal heart sounds. No murmur. No friction rub. No gallop.   Pulmonary:     Effort: Pulmonary effort is normal. No respiratory distress.     Breath sounds: Normal breath sounds. No wheezing, rhonchi  or rales.  Chest:     Chest wall: Tenderness (Mild left sided chest tenderness to palpation.) present.  Abdominal:     Palpations: Abdomen is soft.     Tenderness: There is no abdominal tenderness.  Musculoskeletal: Normal range of motion.     Right lower leg: She exhibits no tenderness. No edema.     Left lower leg: She exhibits no tenderness. No edema.  Skin:    Capillary Refill: Capillary refill takes less than 2 seconds.     Coloration: Skin is not pale.     Findings: No rash.  Neurological:     Mental Status: She is alert.      ED Treatments / Results  Labs (all labs ordered are listed, but only abnormal results are displayed) Labs Reviewed  BASIC METABOLIC PANEL - Abnormal; Notable for the following components:      Result Value   BUN 24 (*)    Creatinine, Ser 1.37 (*)    GFR calc non Af Amer 35 (*)    GFR calc Af Amer 40 (*)    All other components within normal limits  CBC - Abnormal; Notable for the following components:   WBC 3.2 (*)    RBC 3.69 (*)    Hemoglobin 10.6 (*)    HCT 34.2 (*)    Platelets 141 (*)    All other components within normal limits  TROPONIN I (HIGH SENSITIVITY)  TROPONIN I (HIGH SENSITIVITY)    EKG EKG Interpretation  Date/Time:  Thursday November 09 2018 09:04:51 EDT Ventricular Rate:  59 PR Interval:  184 QRS Duration: 128 QT Interval:  440 QTC Calculation: 435 R Axis:   101 Text Interpretation:  Sinus bradycardia Right bundle branch block Abnormal ECG No acute changes No significant change since last tracing Confirmed by Varney Biles [phone]) on 11/09/2018 11:06:45 AM   Radiology Dg Chest 2 View  Result Date: 11/09/2018 CLINICAL DATA:  Left lower chest pains this morning that have not gone away, pt states she usually takes an Asprin to help and it did not alleviate the pains so she came in, no known heart disease, hx diabetes, , non smoker EXAM: CHEST - 2 VIEW COMPARISON:  06/29/2011 CT and chest  x-ray FINDINGS: Heart is  enlarged. There is atherosclerotic calcification of the thoracic aorta. The lungs are clear. No pulmonary edema. IMPRESSION: Cardiomegaly without pulmonary edema. Electronically Signed   By: Nolon Nations M.D.   On: 11/09/2018 09:43    Procedures Procedures (including critical care time)  Medications Ordered in ED Medications  sodium chloride flush (NS) 0.9 % injection 3 mL (has no administration in time range)     Initial Impression / Assessment and Plan / ED Course  I have reviewed the triage vital signs and the nursing notes.  Pertinent labs & imaging results that were available during my care of the patient were reviewed by me and considered in my medical decision making (see chart for details).  Clinical Course as of Nov 09 1355  Thu Nov 09, 2018  1005 Cardiomegaly without pulmonary edema.  DG Chest 2 View [AH]  1038 Low hemoglobin noted at 10.6. This appears to have improved when compared to previous values.  Hemoglobin(!): 10.6 [AH]  1038 Leukopenia noted at 3.2. This appears to be baseline when compared to previous value.  WBC(!): 3.2 [AH]  1208 Initial troponin is 7 and second troponin is 8.   Troponin I (High Sensitivity) [AH]  1303 GFR, Est African American(!): 40 [AH]    Clinical Course User Index [AH] Arville Lime, PA-C      Patient presents with chest pain. Patient is to be discharged with recommendation to follow up with PCP and her cardiologist in regards to today's hospital visit. Chest pain is not likely of cardiac or pulmonary etiology d/t presentation, VSS, no tracheal deviation, no JVD or new murmur, RRR, breath sounds equal bilaterally, EKG without acute abnormalities, and negative CXR. Initial troponin was 7 and second troponin was 8. Heart score is a 5. Pt has been advised to return to the ED if CP becomes exertional, associated with diaphoresis or nausea, radiates to left jaw/arm, worsens or becomes concerning in any way. Pt appears reliable for  follow up and is agreeable to discharge.   Case has been discussed with and seen by Dr. Kathrynn Humble who agrees with the above plan to discharge.   Final Clinical Impressions(s) / ED Diagnoses   Final diagnoses:  Atypical chest pain    ED Discharge Orders    None       Arville Lime, [place] 11/09/18 [place], Ankit, MD 11/12/18 1416

## 2018-11-09 NOTE — ED Notes (Signed)
Up to ambulate to [facility]

## 2018-11-09 NOTE — Discharge Instructions (Signed)
You have been seen today for chest pain. Please read and follow all provided instructions.   1. Medications: usual home medications 2. Treatment: rest, drink plenty of fluids 3. Follow Up: Please call and schedule an appointment with your cardiologist. Please follow up with your primary doctor in 2 days for discussion of your diagnoses and further evaluation after today's visit; if you do not have a primary care doctor use the resource guide provided to find one; Please return to the ER for any new or worsening symptoms. Please obtain all of your results from medical records or have your doctors office obtain the results - share them with your doctor - you should be seen at your doctors office. Call today to arrange your follow up.   Take medications as prescribed. Please review all of the medicines and only take them if you do not have an allergy to them. Return to the emergency room for worsening condition or new concerning symptoms. Follow up with your regular doctor. If you don't have a regular doctor use one of the numbers below to establish a primary care doctor.  Please be aware that if you are taking birth control pills, taking other prescriptions, ESPECIALLY ANTIBIOTICS may make the birth control ineffective - if this is the case, either do not engage in sexual activity or use alternative methods of birth control such as condoms until you have finished the medicine and your family doctor says it is OK to restart them. If you are on a blood thinner such as COUMADIN, be aware that any other medicine that you take may cause the coumadin to either work too much, or not enough - you should have your coumadin level rechecked in next 7 days if this is the case.  ?  It is also a possibility that you have an allergic reaction to any of the medicines that you have been prescribed - Everybody reacts differently to medications and while MOST people have no trouble with most medicines, you may have a reaction  such as nausea, vomiting, rash, swelling, shortness of breath. If this is the case, please stop taking the medicine immediately and contact your physician.  ?  You should return to the ER if you develop severe or worsening symptoms.   Emergency Department Resource Guide 1) Find a Doctor and Pay Out of Pocket Although you won't have to find out who is covered by your insurance plan, it is a good idea to ask around and get recommendations. You will then need to call the office and see if the doctor you have chosen will accept you as a new patient and what types of options they offer for patients who are self-pay. Some doctors offer discounts or will set up payment plans for their patients who do not have insurance, but you will need to ask so you aren't surprised when you get to your appointment.  2) Contact Your Local Health Department Not all health departments have doctors that can see patients for sick visits, but many do, so it is worth a call to see if yours does. If you don't know where your local health department is, you can check in your phone book. The CDC also has a tool to help you locate your state's health department, and many state websites also have listings of all of their local health departments.  3) Find a [facility] If your illness is not likely to be very severe or complicated, you may want to try a  walk in clinic. These are popping up all over the country in pharmacies, drugstores, and shopping centers. They're usually staffed by nurse practitioners or physician assistants that have been trained to treat common illnesses and complaints. They're usually fairly quick and inexpensive. However, if you have serious medical issues or chronic medical problems, these are probably not your best option.  No Primary Care Doctor: Call [facility] at  [phone] - they can help you locate a primary care doctor that  accepts your insurance, provides certain services, etc. Physician  Referral Service- [phone]  Chronic Pain Problems: Organization         Address  Phone   Notes  [facility]  [phone] Patients need to be referred by their primary care doctor.   Medication Assistance: Organization         Address  Phone   Notes  [facility] Medication [facility] [address] [place], [place] [address] [phone] --Must be a resident of [facility] -- Must have NO insurance coverage whatsoever (no Medicaid/ Medicare, etc.) -- The pt. MUST have a primary care doctor that directs their care regularly and follows them in the community   [facility]  [phone]   [employer]  [phone]    Agencies that provide inexpensive medical care: Organization         Address  Phone   Notes  [place]  [phone]   [facility]    [phone]   [facility] [place], [place] [address] [phone]   [place] [address] [place] [phone]   [facility]    [phone]   [facility]    [phone]   [place] [address], [place] Phone:  [phone], Fax:  [phone] Hours of Operation:  9 am - 6 pm, M-F.  Also accepts Medicaid/Medicare and self-pay.  [facility] for [address], [address], [place] Phone: [phone], Fax: [phone]. Hours of Operation:  8:30 am - 5:30 pm, M-F.  Also accepts Medicaid and self-pay.  [facility] [address], [place] Phone: [phone]   [place], [place], [place] [phone], Ext. [phone] Mondays & Thursdays: 7-9 AM.  First 15 patients are seen on a first come, first serve basis.    [place] Providers:  Organization         Address  Phone   Notes  [facility] [address], [place] [phone] Also accepts self-pay patients.  [facility] [address], [place]  [phone]   [place], [address], [place] [phone]   [facility] [address], [place] [phone]   Lucianne Lei [address], [place]   [phone] Only accepts [place] patients after they have their name applied to their card.   Self-Pay (no insurance) in [facility]:  Organization         Address  Phone   Notes  Sickle Cell Patients, [facility] [place] [phone]   [facility] [place] [phone]   [facility] [place]  [address], [address], [place] [phone]   [facility]/Dr. Osei-Bonsu  [address] [place] or [address], [place] [phone] Phone number for both [place] and [place] locations is the same.  [facility] [address] [place] [phone]   [facility] [address], [place] or [address] [phone]   [facility] [address] [place] [phone], phone; [phone], fax Sees patients 1st and 3rd Saturday of every month.  Must not qualify for public or private insurance (i.e. Medicaid, Medicare, [employer] Choice, Veterans' Benefits)  Household income should be no more than 200% of the poverty level The clinic cannot treat you if you are pregnant or think you are pregnant  Sexually transmitted diseases are not treated at the clinic.

## 2018-11-10 DIAGNOSIS — I2510 Atherosclerotic heart disease of native coronary artery without angina pectoris: Secondary | ICD-10-CM | POA: Diagnosis not present

## 2018-11-10 DIAGNOSIS — I10 Essential (primary) hypertension: Secondary | ICD-10-CM | POA: Diagnosis not present

## 2018-11-10 DIAGNOSIS — I340 Nonrheumatic mitral (valve) insufficiency: Secondary | ICD-10-CM | POA: Diagnosis not present

## 2018-11-10 DIAGNOSIS — R0789 Other chest pain: Secondary | ICD-10-CM | POA: Diagnosis not present

## 2018-11-10 DIAGNOSIS — D61818 Other pancytopenia: Secondary | ICD-10-CM | POA: Diagnosis not present

## 2018-11-20 ENCOUNTER — Encounter (INDEPENDENT_AMBULATORY_CARE_PROVIDER_SITE_OTHER): Payer: Medicare HMO | Admitting: Ophthalmology

## 2018-11-20 ENCOUNTER — Other Ambulatory Visit: Payer: Self-pay

## 2018-11-20 DIAGNOSIS — H35033 Hypertensive retinopathy, bilateral: Secondary | ICD-10-CM | POA: Diagnosis not present

## 2018-11-20 DIAGNOSIS — I10 Essential (primary) hypertension: Secondary | ICD-10-CM | POA: Diagnosis not present

## 2018-11-20 DIAGNOSIS — H43813 Vitreous degeneration, bilateral: Secondary | ICD-10-CM | POA: Diagnosis not present

## 2018-11-20 DIAGNOSIS — H353231 Exudative age-related macular degeneration, bilateral, with active choroidal neovascularization: Secondary | ICD-10-CM | POA: Diagnosis not present

## 2018-11-20 DIAGNOSIS — R69 Illness, unspecified: Secondary | ICD-10-CM | POA: Diagnosis not present

## 2018-11-24 DIAGNOSIS — R69 Illness, unspecified: Secondary | ICD-10-CM | POA: Diagnosis not present

## 2018-12-20 ENCOUNTER — Encounter (INDEPENDENT_AMBULATORY_CARE_PROVIDER_SITE_OTHER): Payer: Medicare HMO | Admitting: Ophthalmology

## 2018-12-20 ENCOUNTER — Other Ambulatory Visit: Payer: Self-pay

## 2018-12-20 DIAGNOSIS — I10 Essential (primary) hypertension: Secondary | ICD-10-CM | POA: Diagnosis not present

## 2018-12-20 DIAGNOSIS — H43813 Vitreous degeneration, bilateral: Secondary | ICD-10-CM

## 2018-12-20 DIAGNOSIS — H353231 Exudative age-related macular degeneration, bilateral, with active choroidal neovascularization: Secondary | ICD-10-CM | POA: Diagnosis not present

## 2018-12-20 DIAGNOSIS — H35033 Hypertensive retinopathy, bilateral: Secondary | ICD-10-CM | POA: Diagnosis not present

## 2018-12-25 ENCOUNTER — Other Ambulatory Visit: Payer: Self-pay | Admitting: Cardiology

## 2018-12-25 DIAGNOSIS — I2510 Atherosclerotic heart disease of native coronary artery without angina pectoris: Secondary | ICD-10-CM

## 2018-12-29 DIAGNOSIS — R69 Illness, unspecified: Secondary | ICD-10-CM | POA: Diagnosis not present

## 2019-01-17 ENCOUNTER — Other Ambulatory Visit: Payer: Self-pay

## 2019-01-17 ENCOUNTER — Encounter (INDEPENDENT_AMBULATORY_CARE_PROVIDER_SITE_OTHER): Payer: Medicare HMO | Admitting: Ophthalmology

## 2019-01-17 DIAGNOSIS — I10 Essential (primary) hypertension: Secondary | ICD-10-CM | POA: Diagnosis not present

## 2019-01-17 DIAGNOSIS — H353231 Exudative age-related macular degeneration, bilateral, with active choroidal neovascularization: Secondary | ICD-10-CM

## 2019-01-17 DIAGNOSIS — H43813 Vitreous degeneration, bilateral: Secondary | ICD-10-CM | POA: Diagnosis not present

## 2019-01-17 DIAGNOSIS — H35033 Hypertensive retinopathy, bilateral: Secondary | ICD-10-CM | POA: Diagnosis not present

## 2019-02-15 ENCOUNTER — Encounter (INDEPENDENT_AMBULATORY_CARE_PROVIDER_SITE_OTHER): Payer: Medicare HMO | Admitting: Ophthalmology

## 2019-02-15 DIAGNOSIS — H43813 Vitreous degeneration, bilateral: Secondary | ICD-10-CM | POA: Diagnosis not present

## 2019-02-15 DIAGNOSIS — I10 Essential (primary) hypertension: Secondary | ICD-10-CM

## 2019-02-15 DIAGNOSIS — H353231 Exudative age-related macular degeneration, bilateral, with active choroidal neovascularization: Secondary | ICD-10-CM | POA: Diagnosis not present

## 2019-02-15 DIAGNOSIS — H35033 Hypertensive retinopathy, bilateral: Secondary | ICD-10-CM | POA: Diagnosis not present

## 2019-02-17 DIAGNOSIS — R69 Illness, unspecified: Secondary | ICD-10-CM | POA: Diagnosis not present

## 2019-03-14 ENCOUNTER — Encounter (INDEPENDENT_AMBULATORY_CARE_PROVIDER_SITE_OTHER): Payer: Medicare HMO | Admitting: Ophthalmology

## 2019-03-14 DIAGNOSIS — H353231 Exudative age-related macular degeneration, bilateral, with active choroidal neovascularization: Secondary | ICD-10-CM | POA: Diagnosis not present

## 2019-03-14 DIAGNOSIS — I10 Essential (primary) hypertension: Secondary | ICD-10-CM | POA: Diagnosis not present

## 2019-03-14 DIAGNOSIS — H43813 Vitreous degeneration, bilateral: Secondary | ICD-10-CM | POA: Diagnosis not present

## 2019-03-14 DIAGNOSIS — H35033 Hypertensive retinopathy, bilateral: Secondary | ICD-10-CM | POA: Diagnosis not present

## 2019-03-23 DIAGNOSIS — N189 Chronic kidney disease, unspecified: Secondary | ICD-10-CM | POA: Diagnosis not present

## 2019-03-23 DIAGNOSIS — D649 Anemia, unspecified: Secondary | ICD-10-CM | POA: Diagnosis not present

## 2019-03-23 DIAGNOSIS — D61818 Other pancytopenia: Secondary | ICD-10-CM | POA: Diagnosis not present

## 2019-03-23 DIAGNOSIS — I10 Essential (primary) hypertension: Secondary | ICD-10-CM | POA: Diagnosis not present

## 2019-03-23 DIAGNOSIS — Z79899 Other long term (current) drug therapy: Secondary | ICD-10-CM | POA: Diagnosis not present

## 2019-03-23 DIAGNOSIS — R739 Hyperglycemia, unspecified: Secondary | ICD-10-CM | POA: Diagnosis not present

## 2019-03-30 DIAGNOSIS — D61818 Other pancytopenia: Secondary | ICD-10-CM | POA: Diagnosis not present

## 2019-03-30 DIAGNOSIS — Z0000 Encounter for general adult medical examination without abnormal findings: Secondary | ICD-10-CM | POA: Diagnosis not present

## 2019-03-30 DIAGNOSIS — I10 Essential (primary) hypertension: Secondary | ICD-10-CM | POA: Diagnosis not present

## 2019-03-30 DIAGNOSIS — E7800 Pure hypercholesterolemia, unspecified: Secondary | ICD-10-CM | POA: Diagnosis not present

## 2019-03-30 DIAGNOSIS — I340 Nonrheumatic mitral (valve) insufficiency: Secondary | ICD-10-CM | POA: Diagnosis not present

## 2019-03-30 DIAGNOSIS — I6521 Occlusion and stenosis of right carotid artery: Secondary | ICD-10-CM | POA: Diagnosis not present

## 2019-04-11 ENCOUNTER — Encounter (INDEPENDENT_AMBULATORY_CARE_PROVIDER_SITE_OTHER): Payer: Medicare HMO | Admitting: Ophthalmology

## 2019-04-11 DIAGNOSIS — I10 Essential (primary) hypertension: Secondary | ICD-10-CM | POA: Diagnosis not present

## 2019-04-11 DIAGNOSIS — H353231 Exudative age-related macular degeneration, bilateral, with active choroidal neovascularization: Secondary | ICD-10-CM

## 2019-04-11 DIAGNOSIS — H43813 Vitreous degeneration, bilateral: Secondary | ICD-10-CM

## 2019-04-11 DIAGNOSIS — H35033 Hypertensive retinopathy, bilateral: Secondary | ICD-10-CM | POA: Diagnosis not present

## 2019-05-02 DIAGNOSIS — E1122 Type 2 diabetes mellitus with diabetic chronic kidney disease: Secondary | ICD-10-CM | POA: Diagnosis not present

## 2019-05-02 DIAGNOSIS — N1832 Chronic kidney disease, stage 3b: Secondary | ICD-10-CM | POA: Diagnosis not present

## 2019-05-02 DIAGNOSIS — Z7984 Long term (current) use of oral hypoglycemic drugs: Secondary | ICD-10-CM | POA: Diagnosis not present

## 2019-05-02 DIAGNOSIS — Z8249 Family history of ischemic heart disease and other diseases of the circulatory system: Secondary | ICD-10-CM | POA: Diagnosis not present

## 2019-05-02 DIAGNOSIS — H3530 Unspecified macular degeneration: Secondary | ICD-10-CM | POA: Diagnosis not present

## 2019-05-02 DIAGNOSIS — Z7982 Long term (current) use of aspirin: Secondary | ICD-10-CM | POA: Diagnosis not present

## 2019-05-02 DIAGNOSIS — M109 Gout, unspecified: Secondary | ICD-10-CM | POA: Diagnosis not present

## 2019-05-02 DIAGNOSIS — E785 Hyperlipidemia, unspecified: Secondary | ICD-10-CM | POA: Diagnosis not present

## 2019-05-02 DIAGNOSIS — I129 Hypertensive chronic kidney disease with stage 1 through stage 4 chronic kidney disease, or unspecified chronic kidney disease: Secondary | ICD-10-CM | POA: Diagnosis not present

## 2019-05-02 DIAGNOSIS — H547 Unspecified visual loss: Secondary | ICD-10-CM | POA: Diagnosis not present

## 2019-05-03 DIAGNOSIS — E7800 Pure hypercholesterolemia, unspecified: Secondary | ICD-10-CM | POA: Diagnosis not present

## 2019-05-03 DIAGNOSIS — I10 Essential (primary) hypertension: Secondary | ICD-10-CM | POA: Diagnosis not present

## 2019-05-10 ENCOUNTER — Other Ambulatory Visit: Payer: Self-pay

## 2019-05-10 ENCOUNTER — Encounter (INDEPENDENT_AMBULATORY_CARE_PROVIDER_SITE_OTHER): Payer: Medicare HMO | Admitting: Ophthalmology

## 2019-05-10 DIAGNOSIS — H43813 Vitreous degeneration, bilateral: Secondary | ICD-10-CM | POA: Diagnosis not present

## 2019-05-10 DIAGNOSIS — I10 Essential (primary) hypertension: Secondary | ICD-10-CM

## 2019-05-10 DIAGNOSIS — H353231 Exudative age-related macular degeneration, bilateral, with active choroidal neovascularization: Secondary | ICD-10-CM | POA: Diagnosis not present

## 2019-05-10 DIAGNOSIS — H35033 Hypertensive retinopathy, bilateral: Secondary | ICD-10-CM

## 2019-05-30 DIAGNOSIS — I10 Essential (primary) hypertension: Secondary | ICD-10-CM | POA: Diagnosis not present

## 2019-05-30 DIAGNOSIS — R739 Hyperglycemia, unspecified: Secondary | ICD-10-CM | POA: Diagnosis not present

## 2019-06-05 NOTE — Progress Notes (Signed)
HPI: FU CAD; seen in the past for chest pain and near syncope. An echocardiogram performed in March of 2011 showed normalEF. There was mild aortic and mitral regurgitation. A CardioNet monitor was performed in November of 2011 related to syncope and revealed sinus with pacs. LHC 06/30/11: LAD diffuse 40-50%, circumflex irregular with less than 10% stenosis, mid RCA 50-60%, EF 55-65%. Medical therapy was recommended. Carotid dopplers January 2016 showed no significant stenosis. Since she was last seen,  she denies dyspnea, chest pain, palpitations or syncope.  Her blood pressure is running high despite recent increase in lisinopril by Dr. Maudie Mercury.  Current Outpatient Medications  Medication Sig Dispense Refill  . allopurinol (ZYLOPRIM) 100 MG tablet Take 50 mg by mouth daily.     Marland Kitchen aspirin 81 MG tablet Take 81 mg by mouth daily.      . Bevacizumab (AVASTIN IV) Inject into the vein. Injection in left eye. Every 4 weeks.    . bisoprolol (ZEBETA) 5 MG tablet TAKE 1/2 TABLET (2.5 MG TOTAL) BY MOUTH 2 (TWO) TIMES DAILY. (Patient taking differently: Take 2.5 mg by mouth 2 (two) times a day. ) 90 tablet 0  . Calcium Carbonate (CALTRATE 600 PO) Take 100 mg by mouth at bedtime.     . diazepam (VALIUM) 5 MG tablet Take 5 mg by mouth as needed (as needed for eye injection).     . Iron-Vitamins (GERITOL COMPLETE) TABS Take 1 tablet by mouth daily.     Marland Kitchen lisinopril (ZESTRIL) 40 MG tablet Take 40 mg by mouth daily.    Marland Kitchen moxifloxacin (VIGAMOX) 0.5 % ophthalmic solution Place 1 drop into both eyes as needed. Takes with injection in the eye    . Multiple Vitamins-Minerals (CENTRUM SILVER ULTRA MENS) TABS Take 1 tablet by mouth daily.     . Multiple Vitamins-Minerals (OCUVITE PRESERVISION) TABS Take 1 tablet by mouth 2 (two) times a day.     . pioglitazone (ACTOS) 15 MG tablet Take 2 tablets (30 mg total) by mouth daily. SEE COMMENT (Patient taking differently: Take 15 mg by mouth daily. )    . Polyethyl  Glycol-Propyl Glycol (SYSTANE) 0.4-0.3 % SOLN Place 1 drop into the right eye daily.     . simvastatin (ZOCOR) 40 MG tablet Take 40 mg by mouth at bedtime.     . traMADol (ULTRAM) 50 MG tablet Take 1 tablet (50 mg total) by mouth every 12 (twelve) hours as needed for severe pain. 10 tablet 0   No current facility-administered medications for this visit.     Past Medical History:  Diagnosis Date  . Anemia    Felt secondary renal insuffiency, seen by Dr. Ralene Ok - Workup in the past has included normal iron, folate, B12, serum and urine protein electrophoresis as well as an ANA. Peripheral blood smear had been normal  . Carotid arterial disease ([place])    Last dopplers [id number] - RICA 123456, LICA [id number] for recheck in 1 year  . Coronary artery disease    Moderate nonobstructive disease by cath 06/2011 (ruled out for MI/PE at that time)  . Dyslipidemia   . Esophageal reflux   . Gout   . Macular degeneration (senile) of retina, unspecified   . Osteoporosis   . Other and unspecified hyperlipidemia   . Pancytopenia 2010   Intermittent mild leukopenia and thrombocytopenia felt to be most likely due to immune dysregulation per Dr. Ralene Ok  . Type II or unspecified type diabetes mellitus without mention of  complication, not stated as uncontrolled   . Unspecified essential hypertension     Past Surgical History:  Procedure Laterality Date  . abdominal cyst    . ABDOMINAL HYSTERECTOMY     cyst removal  . APPENDECTOMY     age 86  . LEFT HEART CATHETERIZATION WITH CORONARY ANGIOGRAM N/A 06/30/2011   Procedure: LEFT HEART CATHETERIZATION WITH CORONARY ANGIOGRAM;  Surgeon: Peter M Martinique, MD;  Location: [facility] CATH LAB;  Service: Cardiovascular;  Laterality: N/A;    Social History   Socioeconomic History  . Marital status: Widowed    Spouse name: Not on file  . Number of children: Not on file  . Years of education: Not on file  . Highest education level: Not on file  Occupational History  .  Not on file  Tobacco Use  . Smoking status: Never Smoker  . Smokeless tobacco: Never Used  Substance and Sexual Activity  . Alcohol use: No  . Drug use: No  . Sexual activity: Never    Birth control/protection: Post-menopausal  Other Topics Concern  . Not on file  Social History Narrative   Lives in [place] and lives alone, retired form [employer] work.    Social Determinants of Health   Financial Resource Strain:   . Difficulty of Paying Living Expenses: Not on file  Food Insecurity:   . Worried About Charity fundraiser in the Last Year: Not on file  . Ran Out of Food in the Last Year: Not on file  Transportation Needs:   . Lack of Transportation (Medical): Not on file  . Lack of Transportation (Non-Medical): Not on file  Physical Activity:   . Days of Exercise per Week: Not on file  . Minutes of Exercise per Session: Not on file  Stress:   . Feeling of Stress : Not on file  Social Connections:   . Frequency of Communication with Friends and Family: Not on file  . Frequency of Social Gatherings with Friends and Family: Not on file  . Attends Religious Services: Not on file  . Active Member of Clubs or Organizations: Not on file  . Attends Archivist Meetings: Not on file  . Marital Status: Not on file  Intimate Partner Violence:   . Fear of Current or Ex-Partner: Not on file  . Emotionally Abused: Not on file  . Physically Abused: Not on file  . Sexually Abused: Not on file    Family History  Problem Relation Age of Onset  . Heart attack Father        in his 19's.  . Coronary artery disease Other        younger siblings  . Cancer Maternal Aunt        GYN    ROS: no fevers or chills, productive cough, hemoptysis, dysphasia, odynophagia, melena, hematochezia, dysuria, hematuria, rash, seizure activity, orthopnea, PND, pedal edema, claudication. Remaining systems are negative.  Physical Exam: Well-developed well-nourished in no acute distress.    Skin is warm and dry.  HEENT is normal.  Neck is supple.  Chest is clear to auscultation with normal expansion.  Cardiovascular exam is regular rate and rhythm.  Abdominal exam nontender or distended. No masses palpated. Extremities show no edema. neuro grossly intact   A/P  1 coronary artery disease-patient denies chest pain.  Continue aspirin and statin.  2 hypertension-blood pressure elevated; add amlodipine 2.5 mg daily.  Advance as needed.  3 hyperlipidemia-continue statin.   4 carotid artery disease-continue  aspirin and statin.  Kirk Ruths, MD

## 2019-06-06 ENCOUNTER — Encounter: Payer: Self-pay | Admitting: Cardiology

## 2019-06-06 ENCOUNTER — Other Ambulatory Visit: Payer: Self-pay

## 2019-06-06 ENCOUNTER — Ambulatory Visit (INDEPENDENT_AMBULATORY_CARE_PROVIDER_SITE_OTHER): Payer: Medicare HMO | Admitting: Cardiology

## 2019-06-06 VITALS — BP 174/62 | HR 64 | Ht 60.0 in | Wt 134.2 lb

## 2019-06-06 DIAGNOSIS — E7800 Pure hypercholesterolemia, unspecified: Secondary | ICD-10-CM | POA: Diagnosis not present

## 2019-06-06 DIAGNOSIS — I2510 Atherosclerotic heart disease of native coronary artery without angina pectoris: Secondary | ICD-10-CM | POA: Diagnosis not present

## 2019-06-06 DIAGNOSIS — I10 Essential (primary) hypertension: Secondary | ICD-10-CM | POA: Diagnosis not present

## 2019-06-06 MED ORDER — AMLODIPINE BESYLATE 2.5 MG PO TABS: 2.5000 mg | ORAL_TABLET | Freq: Every day | ORAL | 3 refills | Status: DC

## 2019-06-06 NOTE — Patient Instructions (Signed)
Medication Instructions:  START AMLODIPINE 2.5 MG ONCE DAILY  *If you need a refill on your cardiac medications before your next appointment, please call your pharmacy*  Lab Work: If you have labs (blood work) drawn today and your tests are completely normal, you will receive your results only by: Marland Kitchen MyChart Message (if you have MyChart) OR . A paper copy in the mail If you have any lab test that is abnormal or we need to change your treatment, we will call you to review the results.  Follow-Up: At [facility], you and your health needs are our priority.  As part of our continuing mission to provide you with exceptional heart care, we have created designated Provider Care Teams.  These Care Teams include your primary Cardiologist (physician) and Advanced Practice Providers (APPs -  Physician Assistants and Nurse Practitioners) who all work together to provide you with the care you need, when you need it.  Your next appointment:   6 month(s)  The format for your next appointment:   Either In Person or Virtual  Provider:   You may see Kirk Ruths MD or one of the following Advanced Practice Providers on your designated Care Team:    Kerin Ransom, PA-C  [place], [place]  Coletta Memos, [place]

## 2019-06-11 ENCOUNTER — Telehealth: Payer: Self-pay | Admitting: Cardiology

## 2019-06-11 ENCOUNTER — Other Ambulatory Visit: Payer: Self-pay | Admitting: *Deleted

## 2019-06-11 DIAGNOSIS — I10 Essential (primary) hypertension: Secondary | ICD-10-CM

## 2019-06-11 DIAGNOSIS — E7800 Pure hypercholesterolemia, unspecified: Secondary | ICD-10-CM

## 2019-06-11 MED ORDER — AMLODIPINE BESYLATE 2.5 MG PO TABS: 2.5000 mg | ORAL_TABLET | Freq: Every day | ORAL | 3 refills | Status: DC

## 2019-06-11 MED ORDER — PRAVASTATIN SODIUM 40 MG PO TABS: 40.0000 mg | ORAL_TABLET | Freq: Every evening | ORAL | 3 refills | Status: DC

## 2019-06-11 NOTE — Telephone Encounter (Signed)
New script sent to the pharmacy AND Lab orders mailed to the pt

## 2019-06-11 NOTE — Telephone Encounter (Signed)
Pt c/o medication issue:  1. Name of Medication:   amLODipine (NORVASC) 2.5 MG tablet  simvastatin (ZOCOR) 40 MG tablet  2. How are you currently taking this medication (dosage and times per day)?   3. Are you having a reaction (difficulty breathing--STAT)?   4. What is your medication issue? [facility] is calling in regards to the listed medications stating they cause a medication interaction. Please advise

## 2019-06-11 NOTE — Telephone Encounter (Signed)
Called and spoke to pharmacist. Stated that pt starting amlodipine while taking simvastatin increases the risk of rhabdomyolsis. Wanted the provider to be aware before filling prescription. Will send to Dr. Stanford Breed, pharmacy and nurse for further review.

## 2019-06-11 NOTE — Telephone Encounter (Signed)
Discontinue Zocor.  Treat with pravastatin 40 mg daily.  Check lipids and liver in 12 weeks. Kirk Ruths

## 2019-06-14 ENCOUNTER — Other Ambulatory Visit: Payer: Self-pay

## 2019-06-14 ENCOUNTER — Encounter (INDEPENDENT_AMBULATORY_CARE_PROVIDER_SITE_OTHER): Payer: Medicare HMO | Admitting: Ophthalmology

## 2019-06-14 DIAGNOSIS — H35033 Hypertensive retinopathy, bilateral: Secondary | ICD-10-CM

## 2019-06-14 DIAGNOSIS — H43813 Vitreous degeneration, bilateral: Secondary | ICD-10-CM

## 2019-06-14 DIAGNOSIS — I10 Essential (primary) hypertension: Secondary | ICD-10-CM

## 2019-06-14 DIAGNOSIS — H353231 Exudative age-related macular degeneration, bilateral, with active choroidal neovascularization: Secondary | ICD-10-CM

## 2019-06-29 DIAGNOSIS — C44319 Basal cell carcinoma of skin of other parts of face: Secondary | ICD-10-CM | POA: Diagnosis not present

## 2019-06-29 DIAGNOSIS — L821 Other seborrheic keratosis: Secondary | ICD-10-CM | POA: Diagnosis not present

## 2019-06-29 DIAGNOSIS — L578 Other skin changes due to chronic exposure to nonionizing radiation: Secondary | ICD-10-CM | POA: Diagnosis not present

## 2019-06-29 DIAGNOSIS — Z85820 Personal history of malignant melanoma of skin: Secondary | ICD-10-CM | POA: Diagnosis not present

## 2019-07-09 DIAGNOSIS — C44319 Basal cell carcinoma of skin of other parts of face: Secondary | ICD-10-CM | POA: Diagnosis not present

## 2019-07-12 ENCOUNTER — Encounter (INDEPENDENT_AMBULATORY_CARE_PROVIDER_SITE_OTHER): Payer: Medicare HMO | Admitting: Ophthalmology

## 2019-07-12 DIAGNOSIS — I10 Essential (primary) hypertension: Secondary | ICD-10-CM

## 2019-07-12 DIAGNOSIS — H353231 Exudative age-related macular degeneration, bilateral, with active choroidal neovascularization: Secondary | ICD-10-CM

## 2019-07-12 DIAGNOSIS — H43813 Vitreous degeneration, bilateral: Secondary | ICD-10-CM | POA: Diagnosis not present

## 2019-07-12 DIAGNOSIS — H35033 Hypertensive retinopathy, bilateral: Secondary | ICD-10-CM

## 2019-07-25 DIAGNOSIS — R69 Illness, unspecified: Secondary | ICD-10-CM | POA: Diagnosis not present

## 2019-08-09 ENCOUNTER — Encounter (INDEPENDENT_AMBULATORY_CARE_PROVIDER_SITE_OTHER): Payer: Medicare HMO | Admitting: Ophthalmology

## 2019-08-09 DIAGNOSIS — H43813 Vitreous degeneration, bilateral: Secondary | ICD-10-CM | POA: Diagnosis not present

## 2019-08-09 DIAGNOSIS — I10 Essential (primary) hypertension: Secondary | ICD-10-CM

## 2019-08-09 DIAGNOSIS — H353231 Exudative age-related macular degeneration, bilateral, with active choroidal neovascularization: Secondary | ICD-10-CM | POA: Diagnosis not present

## 2019-08-09 DIAGNOSIS — H35033 Hypertensive retinopathy, bilateral: Secondary | ICD-10-CM | POA: Diagnosis not present

## 2019-08-23 ENCOUNTER — Encounter: Payer: Self-pay | Admitting: Cardiology

## 2019-08-23 DIAGNOSIS — E7800 Pure hypercholesterolemia, unspecified: Secondary | ICD-10-CM | POA: Diagnosis not present

## 2019-08-23 DIAGNOSIS — R739 Hyperglycemia, unspecified: Secondary | ICD-10-CM | POA: Diagnosis not present

## 2019-08-23 DIAGNOSIS — I10 Essential (primary) hypertension: Secondary | ICD-10-CM | POA: Diagnosis not present

## 2019-08-30 DIAGNOSIS — I10 Essential (primary) hypertension: Secondary | ICD-10-CM | POA: Diagnosis not present

## 2019-08-30 DIAGNOSIS — I6521 Occlusion and stenosis of right carotid artery: Secondary | ICD-10-CM | POA: Diagnosis not present

## 2019-08-30 DIAGNOSIS — K5900 Constipation, unspecified: Secondary | ICD-10-CM | POA: Diagnosis not present

## 2019-08-30 DIAGNOSIS — R739 Hyperglycemia, unspecified: Secondary | ICD-10-CM | POA: Diagnosis not present

## 2019-08-30 DIAGNOSIS — E7800 Pure hypercholesterolemia, unspecified: Secondary | ICD-10-CM | POA: Diagnosis not present

## 2019-08-30 DIAGNOSIS — I2510 Atherosclerotic heart disease of native coronary artery without angina pectoris: Secondary | ICD-10-CM | POA: Diagnosis not present

## 2019-08-30 DIAGNOSIS — N189 Chronic kidney disease, unspecified: Secondary | ICD-10-CM | POA: Diagnosis not present

## 2019-08-30 DIAGNOSIS — E11319 Type 2 diabetes mellitus with unspecified diabetic retinopathy without macular edema: Secondary | ICD-10-CM | POA: Diagnosis not present

## 2019-09-06 ENCOUNTER — Encounter (INDEPENDENT_AMBULATORY_CARE_PROVIDER_SITE_OTHER): Payer: Medicare HMO | Admitting: Ophthalmology

## 2019-09-06 ENCOUNTER — Other Ambulatory Visit: Payer: Self-pay

## 2019-09-06 DIAGNOSIS — H353231 Exudative age-related macular degeneration, bilateral, with active choroidal neovascularization: Secondary | ICD-10-CM

## 2019-09-06 DIAGNOSIS — I10 Essential (primary) hypertension: Secondary | ICD-10-CM

## 2019-09-06 DIAGNOSIS — H35033 Hypertensive retinopathy, bilateral: Secondary | ICD-10-CM

## 2019-09-06 DIAGNOSIS — H43813 Vitreous degeneration, bilateral: Secondary | ICD-10-CM

## 2019-10-04 ENCOUNTER — Other Ambulatory Visit: Payer: Self-pay

## 2019-10-04 ENCOUNTER — Encounter (INDEPENDENT_AMBULATORY_CARE_PROVIDER_SITE_OTHER): Payer: Medicare HMO | Admitting: Ophthalmology

## 2019-10-04 DIAGNOSIS — H35033 Hypertensive retinopathy, bilateral: Secondary | ICD-10-CM | POA: Diagnosis not present

## 2019-10-04 DIAGNOSIS — H43813 Vitreous degeneration, bilateral: Secondary | ICD-10-CM | POA: Diagnosis not present

## 2019-10-04 DIAGNOSIS — I10 Essential (primary) hypertension: Secondary | ICD-10-CM

## 2019-10-04 DIAGNOSIS — E113391 Type 2 diabetes mellitus with moderate nonproliferative diabetic retinopathy without macular edema, right eye: Secondary | ICD-10-CM

## 2019-10-04 DIAGNOSIS — H353231 Exudative age-related macular degeneration, bilateral, with active choroidal neovascularization: Secondary | ICD-10-CM

## 2019-10-04 DIAGNOSIS — E11319 Type 2 diabetes mellitus with unspecified diabetic retinopathy without macular edema: Secondary | ICD-10-CM

## 2019-10-13 DIAGNOSIS — M545 Low back pain: Secondary | ICD-10-CM | POA: Diagnosis not present

## 2019-10-13 DIAGNOSIS — M419 Scoliosis, unspecified: Secondary | ICD-10-CM | POA: Diagnosis not present

## 2019-10-20 ENCOUNTER — Other Ambulatory Visit: Payer: Self-pay

## 2019-10-20 ENCOUNTER — Emergency Department (HOSPITAL_COMMUNITY)
Admission: EM | Admit: 2019-10-20 | Discharge: 2019-10-20 | Disposition: A | Discharge status: Home / Self Care | Payer: Medicare HMO | Attending: Emergency Medicine | Admitting: Emergency Medicine

## 2019-10-20 ENCOUNTER — Encounter (HOSPITAL_COMMUNITY): Payer: Self-pay | Admitting: Emergency Medicine

## 2019-10-20 ENCOUNTER — Emergency Department (HOSPITAL_COMMUNITY): Payer: Medicare HMO

## 2019-10-20 DIAGNOSIS — I700 Atherosclerosis of aorta: Secondary | ICD-10-CM | POA: Diagnosis not present

## 2019-10-20 DIAGNOSIS — M419 Scoliosis, unspecified: Secondary | ICD-10-CM | POA: Diagnosis not present

## 2019-10-20 DIAGNOSIS — I2510 Atherosclerotic heart disease of native coronary artery without angina pectoris: Secondary | ICD-10-CM | POA: Diagnosis not present

## 2019-10-20 DIAGNOSIS — M545 Low back pain, unspecified: Secondary | ICD-10-CM

## 2019-10-20 DIAGNOSIS — Z79899 Other long term (current) drug therapy: Secondary | ICD-10-CM | POA: Insufficient documentation

## 2019-10-20 DIAGNOSIS — Z7982 Long term (current) use of aspirin: Secondary | ICD-10-CM | POA: Insufficient documentation

## 2019-10-20 DIAGNOSIS — E119 Type 2 diabetes mellitus without complications: Secondary | ICD-10-CM | POA: Insufficient documentation

## 2019-10-20 DIAGNOSIS — R9431 Abnormal electrocardiogram [ECG] [EKG]: Secondary | ICD-10-CM | POA: Diagnosis not present

## 2019-10-20 DIAGNOSIS — R1032 Left lower quadrant pain: Secondary | ICD-10-CM

## 2019-10-20 DIAGNOSIS — Z7984 Long term (current) use of oral hypoglycemic drugs: Secondary | ICD-10-CM | POA: Insufficient documentation

## 2019-10-20 DIAGNOSIS — K5730 Diverticulosis of large intestine without perforation or abscess without bleeding: Secondary | ICD-10-CM | POA: Diagnosis not present

## 2019-10-20 DIAGNOSIS — N281 Cyst of kidney, acquired: Secondary | ICD-10-CM | POA: Diagnosis not present

## 2019-10-20 LAB — COMPREHENSIVE METABOLIC PANEL
ALT: 15 U/L (ref 0–44)
AST: 19 U/L (ref 15–41)
Albumin: 3.7 g/dL (ref 3.5–5.0)
Alkaline Phosphatase: 54 U/L (ref 38–126)
Anion gap: 10 (ref 5–15)
BUN: 25 mg/dL — ABNORMAL HIGH (ref 8–23)
CO2: 25 mmol/L (ref 22–32)
Calcium: 9.1 mg/dL (ref 8.9–10.3)
Chloride: 101 mmol/L (ref 98–111)
Creatinine, Ser: 1.2 mg/dL — ABNORMAL HIGH (ref 0.44–1.00)
GFR calc Af Amer: 47 mL/min — ABNORMAL LOW (ref >60)
GFR calc non Af Amer: 41 mL/min — ABNORMAL LOW (ref >60)
Glucose, Bld: 104 mg/dL — ABNORMAL HIGH (ref 70–99)
Potassium: 4.1 mmol/L (ref 3.5–5.1)
Sodium: 136 mmol/L (ref 135–145)
Total Bilirubin: 0.7 mg/dL (ref 0.3–1.2)
Total Protein: 6.6 g/dL (ref 6.5–8.1)

## 2019-10-20 LAB — CBC WITH DIFFERENTIAL/PLATELET
Abs Immature Granulocytes: 0.05 10*3/uL (ref 0.00–0.07)
Basophils Absolute: 0 10*3/uL (ref 0.0–0.1)
Basophils Relative: 1 %
Eosinophils Absolute: 0.1 10*3/uL (ref 0.0–0.5)
Eosinophils Relative: 2 %
HCT: 33.1 % — ABNORMAL LOW (ref 36.0–46.0)
Hemoglobin: 10.4 g/dL — ABNORMAL LOW (ref 12.0–15.0)
Immature Granulocytes: 1 %
Lymphocytes Relative: 34 %
Lymphs Abs: 1.6 10*3/uL (ref 0.7–4.0)
MCH: 29 pg (ref 26.0–34.0)
MCHC: 31.4 g/dL (ref 30.0–36.0)
MCV: 92.2 fL (ref 80.0–100.0)
Monocytes Absolute: 0.5 10*3/uL (ref 0.1–1.0)
Monocytes Relative: 11 %
Neutro Abs: 2.3 10*3/uL (ref 1.7–7.7)
Neutrophils Relative %: 51 %
Platelets: 157 10*3/uL (ref 150–400)
RBC: 3.59 MIL/uL — ABNORMAL LOW (ref 3.87–5.11)
RDW: 13.7 % (ref 11.5–15.5)
WBC: 4.6 10*3/uL (ref 4.0–10.5)
nRBC: 0 % (ref 0.0–0.2)

## 2019-10-20 LAB — URINALYSIS, ROUTINE W REFLEX MICROSCOPIC
Bilirubin Urine: NEGATIVE
Glucose, UA: NEGATIVE mg/dL
Hgb urine dipstick: NEGATIVE
Ketones, ur: NEGATIVE mg/dL
Leukocytes,Ua: NEGATIVE
Nitrite: NEGATIVE
Protein, ur: NEGATIVE mg/dL
Specific Gravity, Urine: 1.005 (ref 1.005–1.030)
pH: 6 (ref 5.0–8.0)

## 2019-10-20 LAB — LIPASE, BLOOD: Lipase: 32 U/L (ref 11–51)

## 2019-10-20 MED ORDER — METHOCARBAMOL 500 MG PO TABS
500.0000 mg | ORAL_TABLET | Freq: Two times a day (BID) | ORAL | 0 refills | Status: DC
Start: 2019-10-20 — End: 2022-09-07

## 2019-10-20 MED ORDER — ACETAMINOPHEN 325 MG PO TABS
650.0000 mg | ORAL_TABLET | Freq: Once | ORAL | Status: AC
  Administered 2019-10-20: 650 mg via ORAL
  Filled 2019-10-20: qty 2

## 2019-10-20 MED ORDER — IOHEXOL 300 MG/ML  SOLN
75.0000 mL | Freq: Once | INTRAMUSCULAR | Status: AC | PRN
  Administered 2019-10-20: 75 mL via INTRAVENOUS

## 2019-10-20 NOTE — ED Triage Notes (Signed)
Pt. Stated, My stomach hurts when I moves especially move. It started last Thursday.

## 2019-10-20 NOTE — ED Provider Notes (Signed)
[facility] DEPART[facility] Provider Note   CSN: [id number] Arrival date & time: 10/20/19  1146     History Chief Complaint  Patient presents with  . Abdominal Pain    Patricia Moran is a 86 y.o. female.  HPI Patient reports about 10 days of L sided abdominal pain, worse with certain movements. No associated fever, vomiting, diarrhea or dysuria. She went to an UC about a week ago where they reportedly did a UA and xray which were neg. They gave her some pills, which she took without improvement. She is unsure what that medication was.     Past Medical History:  Diagnosis Date  . Anemia    Felt secondary renal insuffiency, seen by Dr. Ralene Ok - Workup in the past has included normal iron, folate, B12, serum and urine protein electrophoresis as well as an ANA. Peripheral blood smear had been normal  . Carotid arterial disease ([place])    Last dopplers 09/2227 - RICA 79-89%, LICA 2-11% for recheck in 1 year  . Coronary artery disease    Moderate nonobstructive disease by cath 06/2011 (ruled out for MI/PE at that time)  . Dyslipidemia   . Esophageal reflux   . Gout   . Macular degeneration (senile) of retina, unspecified   . Osteoporosis   . Other and unspecified hyperlipidemia   . Pancytopenia 2010   Intermittent mild leukopenia and thrombocytopenia felt to be most likely due to immune dysregulation per Dr. Ralene Ok  . Type II or unspecified type diabetes mellitus without mention of complication, not stated as uncontrolled   . Unspecified essential hypertension     Patient Active Problem List   Diagnosis Date Noted  . CAD (coronary artery disease) 10/08/2011  . Chest pain 07/01/2011  . Anemia 06/30/2011  . Anemia in chronic kidney disease(285.21) 06/15/2011  . Pancytopenia 06/15/2011  . SYNCOPE 08/12/2009  . MITRAL REGURGITATION 12/25/2008  . CAROTID STENOSIS 12/25/2008  . DIABETES MELLITUS, TYPE II 07/23/2008  . HYPERLIPIDEMIA 07/23/2008  . MACULAR  DEGENERATION 07/23/2008  . Essential hypertension 07/23/2008  . GASTROESOPHAGEAL REFLUX DISEASE 07/23/2008  . OSTEOPOROSIS 07/23/2008  . CHEST PAIN, ATYPICAL 07/23/2008  . GOUT, HX OF 07/23/2008    Past Surgical History:  Procedure Laterality Date  . abdominal cyst    . ABDOMINAL HYSTERECTOMY     cyst removal  . APPENDECTOMY     age 33  . LEFT HEART CATHETERIZATION WITH CORONARY ANGIOGRAM N/A 06/30/2011   Procedure: LEFT HEART CATHETERIZATION WITH CORONARY ANGIOGRAM;  Surgeon: Peter M Martinique, MD;  Location: [facility] CATH LAB;  Service: Cardiovascular;  Laterality: N/A;     OB History   No obstetric history on file.     Family History  Problem Relation Age of Onset  . Heart attack Father        in his 86's.  . Coronary artery disease Other        younger siblings  . Cancer Maternal Aunt        GYN    Social History   Tobacco Use  . Smoking status: Never Smoker  . Smokeless tobacco: Never Used  Vaping Use  . Vaping Use: Never used  Substance Use Topics  . Alcohol use: No  . Drug use: No    Home Medications Prior to Admission medications   Medication Sig Start Date End Date Taking? Authorizing Provider  allopurinol (ZYLOPRIM) 100 MG tablet Take 50 mg by mouth daily.     [provider]  amLODipine (NORVASC) 2.5 MG tablet Take 1 tablet (2.5 mg total) by mouth daily. 06/11/19 09/09/19  Lelon Perla, MD  aspirin 81 MG tablet Take 81 mg by mouth daily.      [provider]  Bevacizumab (AVASTIN IV) Inject into the vein. Injection in left eye. Every 4 weeks.    [provider]  bisoprolol (ZEBETA) 5 MG tablet TAKE 1/2 TABLET (2.5 MG TOTAL) BY MOUTH 2 (TWO) TIMES DAILY. Patient taking differently: Take 2.5 mg by mouth 2 (two) times a day.  03/02/13   Lelon Perla, MD  Calcium Carbonate (CALTRATE 600 PO) Take 100 mg by mouth at bedtime.     [provider]  diazepam (VALIUM) 5 MG tablet Take 5 mg by mouth as needed (as needed for eye  injection).     [provider]  Iron-Vitamins (GERITOL COMPLETE) TABS Take 1 tablet by mouth daily.     [provider]  lisinopril (ZESTRIL) 40 MG tablet Take 40 mg by mouth daily. 05/03/19   [provider]  methocarbamol (ROBAXIN) 500 MG tablet Take 1 tablet (500 mg total) by mouth 2 (two) times daily. 10/20/19   Truddie Hidden, MD  moxifloxacin (VIGAMOX) 0.5 % ophthalmic solution Place 1 drop into both eyes as needed. Takes with injection in the eye    [provider]  Multiple Vitamins-Minerals (CENTRUM SILVER ULTRA MENS) TABS Take 1 tablet by mouth daily.     [provider]  Multiple Vitamins-Minerals (OCUVITE PRESERVISION) TABS Take 1 tablet by mouth 2 (two) times a day.     [provider]  pioglitazone (ACTOS) 15 MG tablet Take 2 tablets (30 mg total) by mouth daily. SEE COMMENT Patient taking differently: Take 15 mg by mouth daily.  07/01/11   Dunn, Nedra Hai, PA-C  Polyethyl Glycol-Propyl Glycol (SYSTANE) 0.4-0.3 % SOLN Place 1 drop into the right eye daily.     [provider]  pravastatin (PRAVACHOL) 40 MG tablet Take 1 tablet (40 mg total) by mouth every evening. 06/11/19 09/09/19  Lelon Perla, MD  traMADol (ULTRAM) 50 MG tablet Take 1 tablet (50 mg total) by mouth every 12 (twelve) hours as needed for severe pain. 05/13/17   Rodell Perna A, PA-C    Allergies    Hydrocodone, Nitroglycerin, Ofloxacin, and Sulfonamide derivatives  Review of Systems   Review of Systems A comprehensive review of systems was completed and negative except as noted in HPI.    Physical Exam Updated Vital Signs BP (!) 165/50   Pulse (!) 55   Temp 98.6 F (37 C) (Oral)   Resp (!) 6   SpO2 100%   Physical Exam Vitals and nursing note reviewed.  Constitutional:      Appearance: Normal appearance.  HENT:     Head: Normocephalic and atraumatic.     Nose: Nose normal.     Mouth/Throat:     Mouth: Mucous membranes are moist.  Eyes:      Extraocular Movements: Extraocular movements intact.     Conjunctiva/sclera: Conjunctivae normal.  Cardiovascular:     Rate and Rhythm: Normal rate.  Pulmonary:     Effort: Pulmonary effort is normal.     Breath sounds: Normal breath sounds.  Abdominal:     General: Abdomen is flat.     Palpations: Abdomen is soft.     Tenderness: There is no abdominal tenderness. There is no guarding. Negative signs include Murphy's sign and McBurney's sign.  Musculoskeletal:  General: No swelling. Normal range of motion.     Cervical back: Neck supple.  Skin:    General: Skin is warm and dry.  Neurological:     General: No focal deficit present.     Mental Status: She is alert.  Psychiatric:        Mood and Affect: Mood normal.     ED Results / Procedures / Treatments   Labs (all labs ordered are listed, but only abnormal results are displayed) Labs Reviewed  COMPREHENSIVE METABOLIC PANEL - Abnormal; Notable for the following components:      Result Value   Glucose, Bld 104 (*)    BUN 25 (*)    Creatinine, Ser 1.20 (*)    GFR calc non Af Amer 41 (*)    GFR calc Af Amer 47 (*)    All other components within normal limits  CBC WITH DIFFERENTIAL/PLATELET - Abnormal; Notable for the following components:   RBC 3.59 (*)    Hemoglobin 10.4 (*)    HCT 33.1 (*)    All other components within normal limits  URINALYSIS, ROUTINE W REFLEX MICROSCOPIC - Abnormal; Notable for the following components:   Color, Urine STRAW (*)    All other components within normal limits  LIPASE, BLOOD    EKG EKG Interpretation  Date/Time:  Saturday October 20 2019 11:56:54 EDT Ventricular Rate:  60 PR Interval:  184 QRS Duration: 116 QT Interval:  420 QTC Calculation: 420 R Axis:   111 Text Interpretation: Normal sinus rhythm Low voltage QRS Right bundle branch block Abnormal ECG No significant change since last tracing Confirmed by Calvert Cantor [phone]) on 10/20/2019 11:57:52  AM   Radiology CT Abdomen Pelvis W Contrast  Result Date: 10/20/2019 CLINICAL DATA:  Assess for abdominal abscess or infection. Patient complains of abdomen pain since last Thursday. EXAM: CT ABDOMEN AND PELVIS WITH CONTRAST TECHNIQUE: Multidetector CT imaging of the abdomen and pelvis was performed using the standard protocol following bolus administration of intravenous contrast. CONTRAST:  26mL OMNIPAQUE IOHEXOL 300 MG/ML  SOLN COMPARISON:  None. FINDINGS: Lower chest: Mild dependent atelectasis of posterior lung bases are noted. Heart size mildly enlarged. Hepatobiliary: No focal liver abnormality is seen. No gallstones, gallbladder wall thickening, or biliary dilatation. Pancreas: Unremarkable. No pancreatic ductal dilatation or surrounding inflammatory changes. Spleen: Normal in size without focal abnormality. Adrenals/Urinary Tract: Bilateral adrenal glands are normal. Scarring of right kidney is identified. Left kidney cyst is noted. There is no hydronephrosis bilaterally. Bladder is normal. Stomach/Bowel: Stomach is within normal limits. Appendix is not seen but no inflammation is noted around cecum. No evidence of bowel wall thickening, distention, or inflammatory changes. There is diverticulosis of colon without diverticulitis Vascular/Lymphatic: Aortic atherosclerosis. No enlarged abdominal or pelvic lymph nodes. Reproductive: Status post hysterectomy. No adnexal masses. Other: None. Musculoskeletal: Degenerative joint changes of the spine and scoliosis of the spine are identified. IMPRESSION: 1. No acute abnormality identified in the abdomen and pelvis. 2. Diverticulosis of colon without diverticulitis. 3. Aortic atherosclerosis. Aortic Atherosclerosis (ICD10-I70.0). Electronically Signed   By: Abelardo Diesel M.D.   On: 10/20/2019 14:37    Procedures Procedures (including critical care time)  Medications Ordered in ED Medications  acetaminophen (TYLENOL) tablet 650 mg (has no administration  in time range)  iohexol (OMNIPAQUE) 300 MG/ML solution 75 mL (75 mLs Intravenous Contrast Given 10/20/19 1415)    ED Course  I have reviewed the triage vital signs and the nursing notes.  Pertinent labs & imaging results  that were available during my care of the patient were reviewed by me and considered in my medical decision making (see chart for details).  Clinical Course as of Oct 19 1501  Sat Oct 20, 2019  1349 CMP, Lipase are normal   [CS]  1415 UA negative for infection or blood   [CS]  1610 CT images and results reviewed, no concerning findings. Patient resting comfortably, exam is benign, symptoms likely MSK. Will d/c with APAP, robaxin and PCP followup if not improving.    [CS]    Clinical Course User Index [CS] Truddie Hidden, MD   MDM Rules/Calculators/A&P                          PAtient with vague L lower back and L abdomen pain, not tender on exam. Pain worse with movement. Will check labs and CT.   Final Clinical Impression(s) / ED Diagnoses Final diagnoses:  Acute low back pain without sciatica, unspecified back pain laterality  Left lower quadrant abdominal pain    Rx / DC Orders ED Discharge Orders         Ordered    methocarbamol (ROBAXIN) 500 MG tablet  2 times daily     Discontinue  Reprint     10/20/19 1502           Truddie Hidden, MD 10/20/19 [phone]

## 2019-10-30 DIAGNOSIS — R109 Unspecified abdominal pain: Secondary | ICD-10-CM | POA: Diagnosis not present

## 2019-10-30 DIAGNOSIS — I10 Essential (primary) hypertension: Secondary | ICD-10-CM | POA: Diagnosis not present

## 2019-10-30 DIAGNOSIS — R10812 Left upper quadrant abdominal tenderness: Secondary | ICD-10-CM | POA: Diagnosis not present

## 2019-11-01 ENCOUNTER — Other Ambulatory Visit: Payer: Self-pay

## 2019-11-01 ENCOUNTER — Encounter (INDEPENDENT_AMBULATORY_CARE_PROVIDER_SITE_OTHER): Payer: Medicare HMO | Admitting: Ophthalmology

## 2019-11-01 DIAGNOSIS — H43813 Vitreous degeneration, bilateral: Secondary | ICD-10-CM | POA: Diagnosis not present

## 2019-11-01 DIAGNOSIS — E11311 Type 2 diabetes mellitus with unspecified diabetic retinopathy with macular edema: Secondary | ICD-10-CM

## 2019-11-01 DIAGNOSIS — I10 Essential (primary) hypertension: Secondary | ICD-10-CM

## 2019-11-01 DIAGNOSIS — H353231 Exudative age-related macular degeneration, bilateral, with active choroidal neovascularization: Secondary | ICD-10-CM | POA: Diagnosis not present

## 2019-11-01 DIAGNOSIS — E113313 Type 2 diabetes mellitus with moderate nonproliferative diabetic retinopathy with macular edema, bilateral: Secondary | ICD-10-CM

## 2019-11-01 DIAGNOSIS — H35033 Hypertensive retinopathy, bilateral: Secondary | ICD-10-CM | POA: Diagnosis not present

## 2019-11-06 DIAGNOSIS — R10812 Left upper quadrant abdominal tenderness: Secondary | ICD-10-CM | POA: Diagnosis not present

## 2019-11-06 DIAGNOSIS — I10 Essential (primary) hypertension: Secondary | ICD-10-CM | POA: Diagnosis not present

## 2019-11-06 DIAGNOSIS — R109 Unspecified abdominal pain: Secondary | ICD-10-CM | POA: Diagnosis not present

## 2019-11-29 ENCOUNTER — Encounter (INDEPENDENT_AMBULATORY_CARE_PROVIDER_SITE_OTHER): Payer: Medicare HMO | Admitting: Ophthalmology

## 2019-11-29 ENCOUNTER — Other Ambulatory Visit: Payer: Self-pay

## 2019-11-29 DIAGNOSIS — H35033 Hypertensive retinopathy, bilateral: Secondary | ICD-10-CM

## 2019-11-29 DIAGNOSIS — E11311 Type 2 diabetes mellitus with unspecified diabetic retinopathy with macular edema: Secondary | ICD-10-CM | POA: Diagnosis not present

## 2019-11-29 DIAGNOSIS — E113313 Type 2 diabetes mellitus with moderate nonproliferative diabetic retinopathy with macular edema, bilateral: Secondary | ICD-10-CM

## 2019-11-29 DIAGNOSIS — H353231 Exudative age-related macular degeneration, bilateral, with active choroidal neovascularization: Secondary | ICD-10-CM | POA: Diagnosis not present

## 2019-11-29 DIAGNOSIS — H43813 Vitreous degeneration, bilateral: Secondary | ICD-10-CM | POA: Diagnosis not present

## 2019-11-29 DIAGNOSIS — I10 Essential (primary) hypertension: Secondary | ICD-10-CM

## 2019-12-27 ENCOUNTER — Encounter (INDEPENDENT_AMBULATORY_CARE_PROVIDER_SITE_OTHER): Payer: Medicare HMO | Admitting: Ophthalmology

## 2019-12-27 ENCOUNTER — Other Ambulatory Visit: Payer: Self-pay

## 2019-12-27 DIAGNOSIS — H35033 Hypertensive retinopathy, bilateral: Secondary | ICD-10-CM

## 2019-12-27 DIAGNOSIS — H353231 Exudative age-related macular degeneration, bilateral, with active choroidal neovascularization: Secondary | ICD-10-CM

## 2019-12-27 DIAGNOSIS — I10 Essential (primary) hypertension: Secondary | ICD-10-CM | POA: Diagnosis not present

## 2019-12-27 DIAGNOSIS — H43813 Vitreous degeneration, bilateral: Secondary | ICD-10-CM | POA: Diagnosis not present

## 2020-01-03 ENCOUNTER — Telehealth: Payer: Self-pay | Admitting: Cardiology

## 2020-01-03 NOTE — Telephone Encounter (Signed)
New Message  Pts daughter is calling, she is wondering if the patient needs to have labs done before her appt on 09/28 or if she needs to wait until February   Please call daughter back

## 2020-01-07 NOTE — Progress Notes (Deleted)
HPI: FU CAD; seen in the past for chest pain and near syncope. An echocardiogram performed in March of 2011 showed normalEF. There was mild aortic and mitral regurgitation. A CardioNet monitor was performed in November of 2011 related to syncope and revealed sinus with pacs. LHC 06/30/11: LAD diffuse 40-50%, circumflex irregular with less than 10% stenosis, mid RCA 50-60%, EF 55-65%. Medical therapy was recommended. Carotid dopplers January 2016 showed no significant stenosis. Since she was last seen,   Current Outpatient Medications  Medication Sig Dispense Refill  . allopurinol (ZYLOPRIM) 100 MG tablet Take 50 mg by mouth daily.     Marland Kitchen amLODipine (NORVASC) 2.5 MG tablet Take 1 tablet (2.5 mg total) by mouth daily. 90 tablet 3  . aspirin 81 MG tablet Take 81 mg by mouth daily.      . Bevacizumab (AVASTIN IV) Inject into the vein. Injection in left eye. Every 4 weeks.    . bisoprolol (ZEBETA) 5 MG tablet TAKE 1/2 TABLET (2.5 MG TOTAL) BY MOUTH 2 (TWO) TIMES DAILY. (Patient taking differently: Take 2.5 mg by mouth 2 (two) times a day. ) 90 tablet 0  . Calcium Carbonate (CALTRATE 600 PO) Take 100 mg by mouth at bedtime.     . diazepam (VALIUM) 5 MG tablet Take 5 mg by mouth as needed (as needed for eye injection).     . Iron-Vitamins (GERITOL COMPLETE) TABS Take 1 tablet by mouth daily.     Marland Kitchen lisinopril (ZESTRIL) 40 MG tablet Take 40 mg by mouth daily.    . methocarbamol (ROBAXIN) 500 MG tablet Take 1 tablet (500 mg total) by mouth 2 (two) times daily. 20 tablet 0  . moxifloxacin (VIGAMOX) 0.5 % ophthalmic solution Place 1 drop into both eyes as needed. Takes with injection in the eye    . Multiple Vitamins-Minerals (CENTRUM SILVER ULTRA MENS) TABS Take 1 tablet by mouth daily.     . Multiple Vitamins-Minerals (OCUVITE PRESERVISION) TABS Take 1 tablet by mouth 2 (two) times a day.     . pioglitazone (ACTOS) 15 MG tablet Take 2 tablets (30 mg total) by mouth daily. SEE COMMENT (Patient taking  differently: Take 15 mg by mouth daily. )    . Polyethyl Glycol-Propyl Glycol (SYSTANE) 0.4-0.3 % SOLN Place 1 drop into the right eye daily.     . pravastatin (PRAVACHOL) 40 MG tablet Take 1 tablet (40 mg total) by mouth every evening. 90 tablet 3  . traMADol (ULTRAM) 50 MG tablet Take 1 tablet (50 mg total) by mouth every 12 (twelve) hours as needed for severe pain. 10 tablet 0   No current facility-administered medications for this visit.     Past Medical History:  Diagnosis Date  . Anemia    Felt secondary renal insuffiency, seen by Dr. Ralene Ok - Workup in the past has included normal iron, folate, B12, serum and urine protein electrophoresis as well as an ANA. Peripheral blood smear had been normal  . Carotid arterial disease ([place])    Last dopplers 04/3084 - RICA 57-84%, LICA 6-96% for recheck in 1 year  . Coronary artery disease    Moderate nonobstructive disease by cath 06/2011 (ruled out for MI/PE at that time)  . Dyslipidemia   . Esophageal reflux   . Gout   . Macular degeneration (senile) of retina, unspecified   . Osteoporosis   . Other and unspecified hyperlipidemia   . Pancytopenia 2010   Intermittent mild leukopenia and thrombocytopenia felt to be  most likely due to immune dysregulation per Dr. Ralene Ok  . Type II or unspecified type diabetes mellitus without mention of complication, not stated as uncontrolled   . Unspecified essential hypertension     Past Surgical History:  Procedure Laterality Date  . abdominal cyst    . ABDOMINAL HYSTERECTOMY     cyst removal  . APPENDECTOMY     age 26  . LEFT HEART CATHETERIZATION WITH CORONARY ANGIOGRAM N/A 06/30/2011   Procedure: LEFT HEART CATHETERIZATION WITH CORONARY ANGIOGRAM;  Surgeon: Peter M Martinique, MD;  Location: [facility] CATH LAB;  Service: Cardiovascular;  Laterality: N/A;    Social History   Socioeconomic History  . Marital status: Widowed    Spouse name: Not on file  . Number of children: Not on file  . Years of  education: Not on file  . Highest education level: Not on file  Occupational History  . Not on file  Tobacco Use  . Smoking status: Never Smoker  . Smokeless tobacco: Never Used  Vaping Use  . Vaping Use: Never used  Substance and Sexual Activity  . Alcohol use: No  . Drug use: No  . Sexual activity: Never    Birth control/protection: Post-menopausal  Other Topics Concern  . Not on file  Social History Narrative   Lives in [place] and lives alone, retired form [employer] work.    Social Determinants of Health   Financial Resource Strain:   . Difficulty of Paying Living Expenses: Not on file  Food Insecurity:   . Worried About Charity fundraiser in the Last Year: Not on file  . Ran Out of Food in the Last Year: Not on file  Transportation Needs:   . Lack of Transportation (Medical): Not on file  . Lack of Transportation (Non-Medical): Not on file  Physical Activity:   . Days of Exercise per Week: Not on file  . Minutes of Exercise per Session: Not on file  Stress:   . Feeling of Stress : Not on file  Social Connections:   . Frequency of Communication with Friends and Family: Not on file  . Frequency of Social Gatherings with Friends and Family: Not on file  . Attends Religious Services: Not on file  . Active Member of Clubs or Organizations: Not on file  . Attends Archivist Meetings: Not on file  . Marital Status: Not on file  Intimate Partner Violence:   . Fear of Current or Ex-Partner: Not on file  . Emotionally Abused: Not on file  . Physically Abused: Not on file  . Sexually Abused: Not on file    Family History  Problem Relation Age of Onset  . Heart attack Father        in his 11's.  . Coronary artery disease Other        younger siblings  . Cancer Maternal Aunt        GYN    ROS: no fevers or chills, productive cough, hemoptysis, dysphasia, odynophagia, melena, hematochezia, dysuria, hematuria, rash, seizure activity, orthopnea, PND,  pedal edema, claudication. Remaining systems are negative.  Physical Exam: Well-developed well-nourished in no acute distress.  Skin is warm and dry.  HEENT is normal.  Neck is supple.  Chest is clear to auscultation with normal expansion.  Cardiovascular exam is regular rate and rhythm.  Abdominal exam nontender or distended. No masses palpated. Extremities show no edema. neuro grossly intact  ECG- personally reviewed  A/P  1 CAD-pt has not  had recurrent CP; continue medical therapy with ASA and statin.   2 hypertension-BP controlled; continue present meds and follow.  3 hyperlipidemia-continue statin.  4 Carotid artery disease-will continue with medical therapy.  Kirk Ruths, MD

## 2020-01-07 NOTE — Telephone Encounter (Signed)
Left message for daughter, aware she will need fasting lipid panel for lab work. She is to call back and let me know if going to come fasting for follow up to have drawn same day or if I need to mail the paperwork to her for labs to be drawn prior to appointment.

## 2020-01-11 DIAGNOSIS — Z20828 Contact with and (suspected) exposure to other viral communicable diseases: Secondary | ICD-10-CM | POA: Diagnosis not present

## 2020-01-11 DIAGNOSIS — R509 Fever, unspecified: Secondary | ICD-10-CM | POA: Diagnosis not present

## 2020-01-12 ENCOUNTER — Other Ambulatory Visit (HOSPITAL_COMMUNITY): Payer: Self-pay | Admitting: Nurse Practitioner

## 2020-01-12 DIAGNOSIS — U071 COVID-19: Secondary | ICD-10-CM

## 2020-01-12 DIAGNOSIS — I080 Rheumatic disorders of both mitral and aortic valves: Secondary | ICD-10-CM

## 2020-01-12 DIAGNOSIS — I10 Essential (primary) hypertension: Secondary | ICD-10-CM

## 2020-01-12 NOTE — Progress Notes (Signed)
I connected by phone with Patricia Moran on 01/12/2020 at 2:01 PM to discuss the potential use of a new treatment for mild to moderate COVID-19 viral infection in non-hospitalized patients.  This patient is a 86 y.o. female that meets the FDA criteria for Emergency Use Authorization of COVID monoclonal antibody casirivimab/imdevimab or bamlanivimab/eteseviamb.  Has a (+) direct SARS-CoV-2 viral test result  Has mild or moderate COVID-19   Is NOT hospitalized due to COVID-19  Is within 10 days of symptom onset  Has at least one of the high risk factor(s) for progression to severe COVID-19 and/or hospitalization as defined in EUA.  Specific high risk criteria : Older age (>/= 84 yo) and Cardiovascular disease or hypertension   I have spoken and communicated the following to the patient or parent/caregiver regarding COVID monoclonal antibody treatment:  1. FDA has authorized the emergency use for the treatment of mild to moderate COVID-19 in adults and pediatric patients with positive results of direct SARS-CoV-2 viral testing who are 51 years of age and older weighing at least 40 kg, and who are at high risk for progressing to severe COVID-19 and/or hospitalization.  2. The significant known and potential risks and benefits of COVID monoclonal antibody, and the extent to which such potential risks and benefits are unknown.  3. Information on available alternative treatments and the risks and benefits of those alternatives, including clinical trials.  4. Patients treated with COVID monoclonal antibody should continue to self-isolate and use infection control measures (e.g., wear mask, isolate, social distance, avoid sharing personal items, clean and disinfect "high touch" surfaces, and frequent handwashing) according to CDC guidelines.   5. The patient or parent/caregiver has the option to accept or refuse COVID monoclonal antibody treatment.  After reviewing this information with the patient,  the patient has agreed to receive one of the available covid 19 monoclonal antibodies and will be provided an appropriate fact sheet prior to infusion. Jobe Gibbon, NP 01/12/2020 2:01 PM

## 2020-01-13 ENCOUNTER — Ambulatory Visit (HOSPITAL_COMMUNITY)
Admission: RE | Admit: 2020-01-13 | Discharge: 2020-01-13 | Disposition: A | Discharge status: Home / Self Care | Payer: Medicare Other | Source: Ambulatory Visit | Attending: Pulmonary Disease | Admitting: Pulmonary Disease

## 2020-01-13 ENCOUNTER — Other Ambulatory Visit (HOSPITAL_COMMUNITY): Payer: Self-pay

## 2020-01-13 DIAGNOSIS — I080 Rheumatic disorders of both mitral and aortic valves: Secondary | ICD-10-CM | POA: Diagnosis present

## 2020-01-13 DIAGNOSIS — Z23 Encounter for immunization: Secondary | ICD-10-CM | POA: Insufficient documentation

## 2020-01-13 DIAGNOSIS — I10 Essential (primary) hypertension: Secondary | ICD-10-CM | POA: Diagnosis present

## 2020-01-13 DIAGNOSIS — U071 COVID-19: Secondary | ICD-10-CM

## 2020-01-13 MED ORDER — ALBUTEROL SULFATE HFA 108 (90 BASE) MCG/ACT IN AERS: 2.0000 | INHALATION_SPRAY | Freq: Once | RESPIRATORY_TRACT | Status: DC | PRN

## 2020-01-13 MED ORDER — METHYLPREDNISOLONE SODIUM SUCC 125 MG IJ SOLR: 125.0000 mg | Freq: Once | INTRAMUSCULAR | Status: DC | PRN

## 2020-01-13 MED ORDER — DIPHENHYDRAMINE HCL 50 MG/ML IJ SOLN: 50.0000 mg | Freq: Once | INTRAMUSCULAR | Status: DC | PRN

## 2020-01-13 MED ORDER — FAMOTIDINE IN NACL 20-0.9 MG/50ML-% IV SOLN: 20.0000 mg | Freq: Once | INTRAVENOUS | Status: DC | PRN

## 2020-01-13 MED ORDER — SODIUM CHLORIDE 0.9 % IV SOLN
1200.0000 mg | Freq: Once | INTRAVENOUS | Status: AC
  Administered 2020-01-13: 1200 mg via INTRAVENOUS

## 2020-01-13 MED ORDER — SODIUM CHLORIDE 0.9 % IV SOLN: INTRAVENOUS | Status: DC | PRN

## 2020-01-13 MED ORDER — EPINEPHRINE 0.3 MG/0.3ML IJ SOAJ: 0.3000 mg | Freq: Once | INTRAMUSCULAR | Status: DC | PRN

## 2020-01-13 NOTE — Progress Notes (Signed)
  Diagnosis: COVID-19  Physician: Dr. Wright   Procedure: Covid Infusion Clinic Med: casirivimab\imdevimab infusion - Provided patient with casirivimab\imdevimab fact sheet for patients, parents and caregivers prior to infusion.  Complications: No immediate complications noted.  Discharge: Discharged home   Marchetta Navratil  Bell 01/13/2020   

## 2020-01-13 NOTE — Discharge Instructions (Signed)

## 2020-01-15 ENCOUNTER — Ambulatory Visit: Payer: Medicare HMO | Admitting: Cardiology

## 2020-01-31 ENCOUNTER — Other Ambulatory Visit: Payer: Self-pay

## 2020-01-31 ENCOUNTER — Encounter (INDEPENDENT_AMBULATORY_CARE_PROVIDER_SITE_OTHER): Payer: Medicare HMO | Admitting: Ophthalmology

## 2020-01-31 DIAGNOSIS — H35033 Hypertensive retinopathy, bilateral: Secondary | ICD-10-CM | POA: Diagnosis not present

## 2020-01-31 DIAGNOSIS — H43813 Vitreous degeneration, bilateral: Secondary | ICD-10-CM | POA: Diagnosis not present

## 2020-01-31 DIAGNOSIS — H353231 Exudative age-related macular degeneration, bilateral, with active choroidal neovascularization: Secondary | ICD-10-CM

## 2020-01-31 DIAGNOSIS — I10 Essential (primary) hypertension: Secondary | ICD-10-CM | POA: Diagnosis not present

## 2020-03-06 NOTE — Progress Notes (Signed)
HPI: FU CAD; seen in the past for chest pain and near syncope. An echocardiogram performed in March of 2011 showed normalEF. There was mild aortic and mitral regurgitation. A CardioNet monitor was performed in November of 2011 related to syncope and revealed sinus with pacs. LHC 06/30/11: LAD diffuse 40-50%, circumflex irregular with less than 10% stenosis, mid RCA 50-60%, EF 55-65%. Medical therapy was recommended. Carotid dopplers January 2016 showed no significant stenosis. Since she was last seen,she denies increased dyspnea.  She occasionally has brief dull pain in her chest not related to activities.  No radiation or associated symptoms.  Resolve spontaneously.  She denies syncope.  She has noticed minimal pedal edema.  Current Outpatient Medications  Medication Sig Dispense Refill   allopurinol (ZYLOPRIM) 100 MG tablet Take 50 mg by mouth daily.      aspirin 81 MG tablet Take 81 mg by mouth daily.       Bevacizumab (AVASTIN IV) Inject into the vein. Injection in left eye. Every 4 weeks.     bisoprolol (ZEBETA) 5 MG tablet TAKE 1/2 TABLET (2.5 MG TOTAL) BY MOUTH 2 (TWO) TIMES DAILY. (Patient taking differently: Take 2.5 mg by mouth 2 (two) times a day. ) 90 tablet 0   Calcium Carbonate (CALTRATE 600 PO) Take 100 mg by mouth at bedtime.      diazepam (VALIUM) 5 MG tablet Take 5 mg by mouth as needed (as needed for eye injection).      Iron-Vitamins (GERITOL COMPLETE) TABS Take 1 tablet by mouth daily.      lisinopril (ZESTRIL) 40 MG tablet Take 40 mg by mouth daily.     methocarbamol (ROBAXIN) 500 MG tablet Take 1 tablet (500 mg total) by mouth 2 (two) times daily. 20 tablet 0   moxifloxacin (VIGAMOX) 0.5 % ophthalmic solution Place 1 drop into both eyes as needed. Takes with injection in the eye     Multiple Vitamins-Minerals (CENTRUM SILVER ULTRA MENS) TABS Take 1 tablet by mouth daily.      Multiple Vitamins-Minerals (OCUVITE PRESERVISION) TABS Take 1 tablet by mouth 2  (two) times a day.      pioglitazone (ACTOS) 15 MG tablet Take 2 tablets (30 mg total) by mouth daily. SEE COMMENT (Patient taking differently: Take 15 mg by mouth daily. )     Polyethyl Glycol-Propyl Glycol (SYSTANE) 0.4-0.3 % SOLN Place 1 drop into the right eye daily.      pravastatin (PRAVACHOL) 40 MG tablet Take 1 tablet (40 mg total) by mouth every evening. 90 tablet 3   amLODipine (NORVASC) 2.5 MG tablet Take 1 tablet (2.5 mg total) by mouth daily. (Patient not taking: Reported on 03/18/2020) 90 tablet 3   No current facility-administered medications for this visit.     Past Medical History:  Diagnosis Date   Anemia    Felt secondary renal insuffiency, seen by Dr. Ralene Ok - Workup in the past has included normal iron, folate, B12, serum and urine protein electrophoresis as well as an ANA. Peripheral blood smear had been normal   Carotid arterial disease (HCC)    Last dopplers 0/8657 - RICA 84-69%, LICA 6-29% for recheck in 1 year   Coronary artery disease    Moderate nonobstructive disease by cath 06/2011 (ruled out for MI/PE at that time)   Dyslipidemia    Esophageal reflux    Gout    Macular degeneration (senile) of retina, unspecified    Osteoporosis    Other and unspecified hyperlipidemia  Pancytopenia 2010   Intermittent mild leukopenia and thrombocytopenia felt to be most likely due to immune dysregulation per Dr. Ralene Ok   Type II or unspecified type diabetes mellitus without mention of complication, not stated as uncontrolled    Unspecified essential hypertension     Past Surgical History:  Procedure Laterality Date   abdominal cyst     ABDOMINAL HYSTERECTOMY     cyst removal   APPENDECTOMY     age 13   LEFT HEART CATHETERIZATION WITH CORONARY ANGIOGRAM N/A 06/30/2011   Procedure: LEFT HEART CATHETERIZATION WITH CORONARY ANGIOGRAM;  Surgeon: Peter M Martinique, MD;  Location: [facility] CATH LAB;  Service: Cardiovascular;  Laterality: N/A;    Social  History   Socioeconomic History   Marital status: Widowed    Spouse name: Not on file   Number of children: Not on file   Years of education: Not on file   Highest education level: Not on file  Occupational History   Not on file  Tobacco Use   Smoking status: Never Smoker   Smokeless tobacco: Never Used  Vaping Use   Vaping Use: Never used  Substance and Sexual Activity   Alcohol use: No   Drug use: No   Sexual activity: Never    Birth control/protection: Post-menopausal  Other Topics Concern   Not on file  Social History Narrative   Lives in [place] and lives alone, 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    Social Determinants of Health   Financial Resource Strain:    Difficulty of Paying Living Expenses: Not on file  Food Insecurity:    Worried About Charity fundraiser in the Last Year: Not on file   [employer] of Food in the Last Year: Not on file  Transportation Needs:    Lack of Transportation (Medical): Not on file   Lack of Transportation (Non-Medical): Not on file  Physical Activity:    Days of Exercise per Week: Not on file   Minutes of Exercise per Session: Not on file  Stress:    Feeling of Stress : Not on file  Social Connections:    Frequency of Communication with Friends and Family: Not on file   Frequency of Social Gatherings with Friends and Family: Not on file   Attends Religious Services: Not on file   Active Member of Clubs or Organizations: Not on file   Attends Archivist Meetings: Not on file   Marital Status: Not on file  Intimate Partner Violence:    Fear of Current or Ex-Partner: Not on file   Emotionally Abused: Not on file   Physically Abused: Not on file   Sexually Abused: Not on file    Family History  Problem Relation Age of Onset   Heart attack Father        in his 31's.   Coronary artery disease Other        younger siblings   Cancer Maternal Aunt        GYN    ROS: no fevers or chills,  productive cough, hemoptysis, dysphasia, odynophagia, melena, hematochezia, dysuria, hematuria, rash, seizure activity, orthopnea, PND, pedal edema, claudication. Remaining systems are negative.  Physical Exam: Well-developed frail in no acute distress.  Skin is warm and dry.  HEENT is normal.  Neck is supple.  Chest is clear to auscultation with normal expansion.  Cardiovascular exam is regular rate and rhythm.  2/6 systolic murmur left sternal border. Abdominal exam nontender or distended. No masses palpated.  Extremities show trace edema. neuro grossly intact  ECG-sinus rhythm with right bundle branch block and no ST changes.  A/P  1 coronary artery disease-Continue aspirin and statin.  She describes an occasional dull pain in her chest that is short lived.  It is not exertional.  Electrocardiogram shows no new ST changes.  We will follow for now.  2 hypertension-patient's blood pressure is controlled today.  Continue present medications and follow.  3 carotid artery disease-continue aspirin and statin.  4 hyperlipidemia-continue statin.  5 murmur-patient sounds to have possible aortic stenosis murmur on examination.  We will plan echocardiogram to further assess.  6 pedal edema-patient describes minimal pedal edema.  I have asked her to keep her feet elevated during the day when she is resting and to decrease sodium intake.  Can add low-dose diuretic in the future if needed.  Kirk Ruths, MD

## 2020-03-10 DIAGNOSIS — R69 Illness, unspecified: Secondary | ICD-10-CM | POA: Diagnosis not present

## 2020-03-12 ENCOUNTER — Encounter (INDEPENDENT_AMBULATORY_CARE_PROVIDER_SITE_OTHER): Payer: Medicare HMO | Admitting: Ophthalmology

## 2020-03-12 ENCOUNTER — Other Ambulatory Visit: Payer: Self-pay

## 2020-03-12 DIAGNOSIS — H353231 Exudative age-related macular degeneration, bilateral, with active choroidal neovascularization: Secondary | ICD-10-CM | POA: Diagnosis not present

## 2020-03-12 DIAGNOSIS — H35033 Hypertensive retinopathy, bilateral: Secondary | ICD-10-CM | POA: Diagnosis not present

## 2020-03-12 DIAGNOSIS — H43813 Vitreous degeneration, bilateral: Secondary | ICD-10-CM | POA: Diagnosis not present

## 2020-03-12 DIAGNOSIS — I10 Essential (primary) hypertension: Secondary | ICD-10-CM

## 2020-03-18 ENCOUNTER — Ambulatory Visit (INDEPENDENT_AMBULATORY_CARE_PROVIDER_SITE_OTHER): Payer: Medicare HMO | Admitting: Cardiology

## 2020-03-18 ENCOUNTER — Encounter: Payer: Self-pay | Admitting: Cardiology

## 2020-03-18 ENCOUNTER — Other Ambulatory Visit: Payer: Self-pay

## 2020-03-18 VITALS — BP 142/74 | HR 68 | Ht 61.0 in | Wt 127.0 lb

## 2020-03-18 DIAGNOSIS — I10 Essential (primary) hypertension: Secondary | ICD-10-CM | POA: Diagnosis not present

## 2020-03-18 DIAGNOSIS — R011 Cardiac murmur, unspecified: Secondary | ICD-10-CM

## 2020-03-18 DIAGNOSIS — I2510 Atherosclerotic heart disease of native coronary artery without angina pectoris: Secondary | ICD-10-CM

## 2020-03-18 DIAGNOSIS — E7800 Pure hypercholesterolemia, unspecified: Secondary | ICD-10-CM

## 2020-03-18 NOTE — Patient Instructions (Signed)

## 2020-03-24 DIAGNOSIS — M1990 Unspecified osteoarthritis, unspecified site: Secondary | ICD-10-CM | POA: Diagnosis not present

## 2020-03-24 DIAGNOSIS — N390 Urinary tract infection, site not specified: Secondary | ICD-10-CM | POA: Diagnosis not present

## 2020-03-24 DIAGNOSIS — E7800 Pure hypercholesterolemia, unspecified: Secondary | ICD-10-CM | POA: Diagnosis not present

## 2020-03-24 DIAGNOSIS — Z0000 Encounter for general adult medical examination without abnormal findings: Secondary | ICD-10-CM | POA: Diagnosis not present

## 2020-03-24 DIAGNOSIS — D649 Anemia, unspecified: Secondary | ICD-10-CM | POA: Diagnosis not present

## 2020-03-24 DIAGNOSIS — R739 Hyperglycemia, unspecified: Secondary | ICD-10-CM | POA: Diagnosis not present

## 2020-03-24 DIAGNOSIS — D61818 Other pancytopenia: Secondary | ICD-10-CM | POA: Diagnosis not present

## 2020-03-24 DIAGNOSIS — I10 Essential (primary) hypertension: Secondary | ICD-10-CM | POA: Diagnosis not present

## 2020-04-09 DIAGNOSIS — Z0000 Encounter for general adult medical examination without abnormal findings: Secondary | ICD-10-CM | POA: Diagnosis not present

## 2020-04-09 DIAGNOSIS — I10 Essential (primary) hypertension: Secondary | ICD-10-CM | POA: Diagnosis not present

## 2020-04-09 DIAGNOSIS — I2510 Atherosclerotic heart disease of native coronary artery without angina pectoris: Secondary | ICD-10-CM | POA: Diagnosis not present

## 2020-04-09 DIAGNOSIS — E7800 Pure hypercholesterolemia, unspecified: Secondary | ICD-10-CM | POA: Diagnosis not present

## 2020-04-09 DIAGNOSIS — E119 Type 2 diabetes mellitus without complications: Secondary | ICD-10-CM | POA: Diagnosis not present

## 2020-04-14 ENCOUNTER — Other Ambulatory Visit: Payer: Self-pay

## 2020-04-14 ENCOUNTER — Ambulatory Visit (HOSPITAL_COMMUNITY): Payer: Medicare HMO | Attending: Cardiology

## 2020-04-14 DIAGNOSIS — R011 Cardiac murmur, unspecified: Secondary | ICD-10-CM | POA: Diagnosis not present

## 2020-04-14 LAB — ECHOCARDIOGRAM COMPLETE
AR max vel: 0.93 cm2
AV Area VTI: 0.88 cm2
AV Area mean vel: 0.8 cm2
AV Mean grad: 11.5 mmHg
AV Peak grad: 20.2 mmHg
Ao pk vel: 2.25 m/s
Area-P 1/2: 4.68 cm2
S' Lateral: 3.15 cm

## 2020-04-22 ENCOUNTER — Encounter: Payer: Self-pay | Admitting: *Deleted

## 2020-04-24 ENCOUNTER — Encounter (INDEPENDENT_AMBULATORY_CARE_PROVIDER_SITE_OTHER): Payer: Medicare HMO | Admitting: Ophthalmology

## 2020-04-24 ENCOUNTER — Other Ambulatory Visit: Payer: Self-pay

## 2020-04-24 DIAGNOSIS — H35033 Hypertensive retinopathy, bilateral: Secondary | ICD-10-CM

## 2020-04-24 DIAGNOSIS — H353231 Exudative age-related macular degeneration, bilateral, with active choroidal neovascularization: Secondary | ICD-10-CM | POA: Diagnosis not present

## 2020-04-24 DIAGNOSIS — H43813 Vitreous degeneration, bilateral: Secondary | ICD-10-CM | POA: Diagnosis not present

## 2020-04-24 DIAGNOSIS — I10 Essential (primary) hypertension: Secondary | ICD-10-CM

## 2020-05-05 ENCOUNTER — Encounter (INDEPENDENT_AMBULATORY_CARE_PROVIDER_SITE_OTHER): Payer: Medicare HMO | Admitting: Ophthalmology

## 2020-05-14 DIAGNOSIS — I10 Essential (primary) hypertension: Secondary | ICD-10-CM | POA: Diagnosis not present

## 2020-05-14 DIAGNOSIS — H547 Unspecified visual loss: Secondary | ICD-10-CM | POA: Diagnosis not present

## 2020-05-14 DIAGNOSIS — H409 Unspecified glaucoma: Secondary | ICD-10-CM | POA: Diagnosis not present

## 2020-05-14 DIAGNOSIS — M109 Gout, unspecified: Secondary | ICD-10-CM | POA: Diagnosis not present

## 2020-05-14 DIAGNOSIS — K5900 Constipation, unspecified: Secondary | ICD-10-CM | POA: Diagnosis not present

## 2020-05-14 DIAGNOSIS — Z7984 Long term (current) use of oral hypoglycemic drugs: Secondary | ICD-10-CM | POA: Diagnosis not present

## 2020-05-14 DIAGNOSIS — E119 Type 2 diabetes mellitus without complications: Secondary | ICD-10-CM | POA: Diagnosis not present

## 2020-05-14 DIAGNOSIS — E785 Hyperlipidemia, unspecified: Secondary | ICD-10-CM | POA: Diagnosis not present

## 2020-05-14 DIAGNOSIS — M810 Age-related osteoporosis without current pathological fracture: Secondary | ICD-10-CM | POA: Diagnosis not present

## 2020-05-14 DIAGNOSIS — Z7982 Long term (current) use of aspirin: Secondary | ICD-10-CM | POA: Diagnosis not present

## 2020-06-05 ENCOUNTER — Other Ambulatory Visit: Payer: Self-pay

## 2020-06-05 ENCOUNTER — Encounter (INDEPENDENT_AMBULATORY_CARE_PROVIDER_SITE_OTHER): Payer: Medicare HMO | Admitting: Ophthalmology

## 2020-06-05 DIAGNOSIS — H43813 Vitreous degeneration, bilateral: Secondary | ICD-10-CM | POA: Diagnosis not present

## 2020-06-05 DIAGNOSIS — H353231 Exudative age-related macular degeneration, bilateral, with active choroidal neovascularization: Secondary | ICD-10-CM

## 2020-06-05 DIAGNOSIS — I10 Essential (primary) hypertension: Secondary | ICD-10-CM | POA: Diagnosis not present

## 2020-06-05 DIAGNOSIS — H35033 Hypertensive retinopathy, bilateral: Secondary | ICD-10-CM

## 2020-06-11 DIAGNOSIS — Z961 Presence of intraocular lens: Secondary | ICD-10-CM | POA: Diagnosis not present

## 2020-06-11 DIAGNOSIS — H353232 Exudative age-related macular degeneration, bilateral, with inactive choroidal neovascularization: Secondary | ICD-10-CM | POA: Diagnosis not present

## 2020-06-11 DIAGNOSIS — H401122 Primary open-angle glaucoma, left eye, moderate stage: Secondary | ICD-10-CM | POA: Diagnosis not present

## 2020-06-27 DIAGNOSIS — L820 Inflamed seborrheic keratosis: Secondary | ICD-10-CM | POA: Diagnosis not present

## 2020-06-27 DIAGNOSIS — L728 Other follicular cysts of the skin and subcutaneous tissue: Secondary | ICD-10-CM | POA: Diagnosis not present

## 2020-06-27 DIAGNOSIS — L570 Actinic keratosis: Secondary | ICD-10-CM | POA: Diagnosis not present

## 2020-06-27 DIAGNOSIS — C44319 Basal cell carcinoma of skin of other parts of face: Secondary | ICD-10-CM | POA: Diagnosis not present

## 2020-07-09 DIAGNOSIS — E119 Type 2 diabetes mellitus without complications: Secondary | ICD-10-CM | POA: Diagnosis not present

## 2020-07-09 DIAGNOSIS — D61818 Other pancytopenia: Secondary | ICD-10-CM | POA: Diagnosis not present

## 2020-07-09 DIAGNOSIS — E7800 Pure hypercholesterolemia, unspecified: Secondary | ICD-10-CM | POA: Diagnosis not present

## 2020-07-24 ENCOUNTER — Other Ambulatory Visit: Payer: Self-pay

## 2020-07-24 ENCOUNTER — Encounter (INDEPENDENT_AMBULATORY_CARE_PROVIDER_SITE_OTHER): Payer: Medicare HMO | Admitting: Ophthalmology

## 2020-07-24 DIAGNOSIS — H353231 Exudative age-related macular degeneration, bilateral, with active choroidal neovascularization: Secondary | ICD-10-CM

## 2020-07-24 DIAGNOSIS — H43813 Vitreous degeneration, bilateral: Secondary | ICD-10-CM

## 2020-07-24 DIAGNOSIS — H35033 Hypertensive retinopathy, bilateral: Secondary | ICD-10-CM | POA: Diagnosis not present

## 2020-07-24 DIAGNOSIS — I10 Essential (primary) hypertension: Secondary | ICD-10-CM | POA: Diagnosis not present

## 2020-08-09 IMAGING — CT CT ABD-PELV W/ CM
2 of 5 series · 16 of 46 positions shown, 18 images · IV contrast (Omni 300)
Comparison: None.

CLINICAL DATA: Assess for abdominal abscess or infection. Patient
complains of abdomen pain since [REDACTED].

EXAM:
CT ABDOMEN AND PELVIS WITH CONTRAST
TECHNIQUE: Multidetector CT imaging of the abdomen and pelvis was performed
using the standard protocol following bolus administration of
intravenous contrast.
CONTRAST:  75mL OMNIPAQUE IOHEXOL 300 MG/ML  SOLN

[Series 3: a/p w/ 5mm · axial · 0.77mm/px · z∈[-378,-28]mm · 13 of 80 slices shown, 15 images]
[im 5/80  soft-tissue]
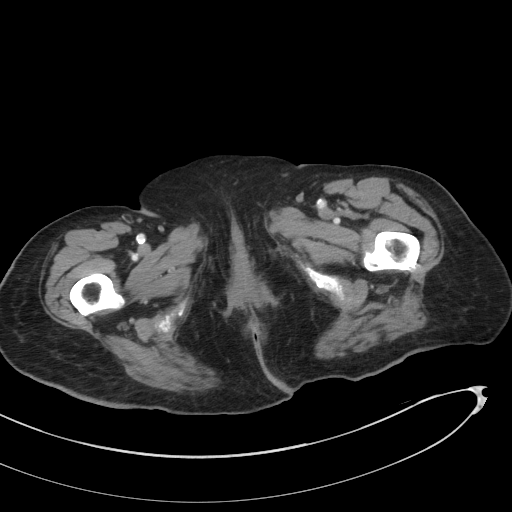
[im 5/80  bone]
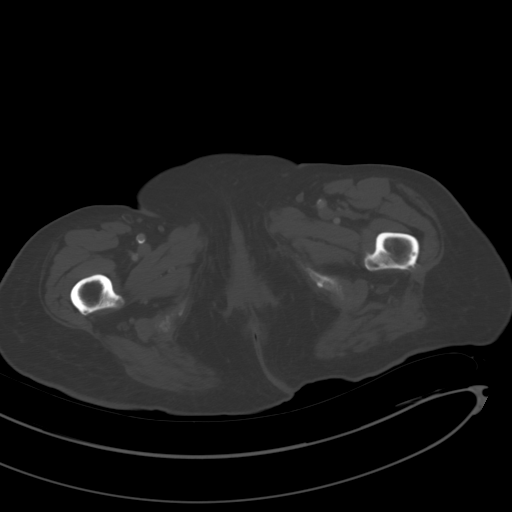
[im 13/80  soft-tissue]
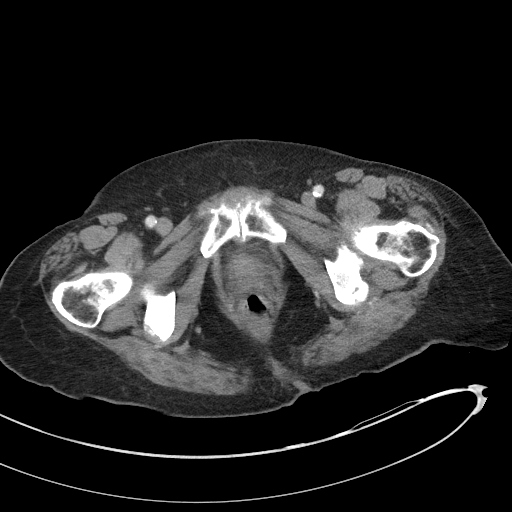
[im 17/80  soft-tissue]
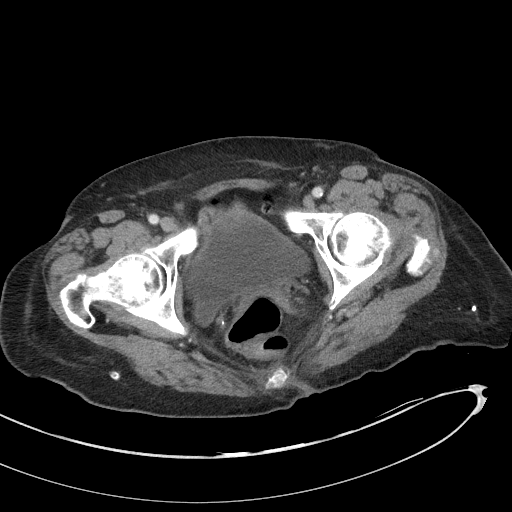
[im 21/80  soft-tissue]
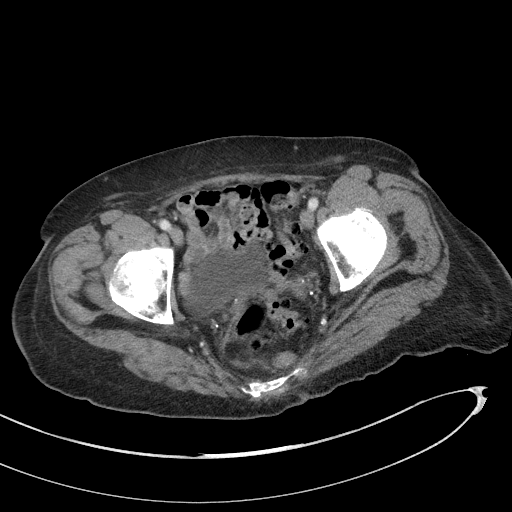
[im 30/80  soft-tissue]
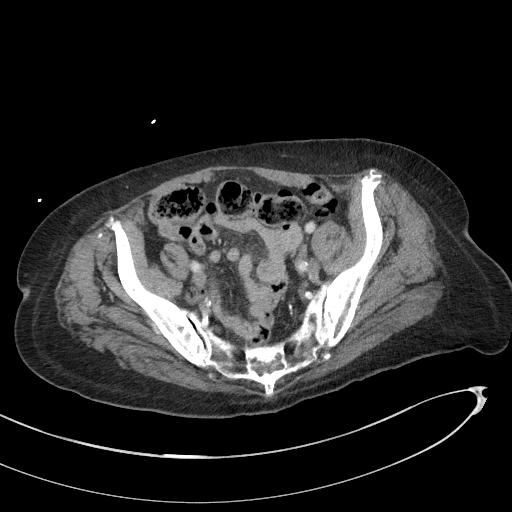
[im 34/80  soft-tissue]
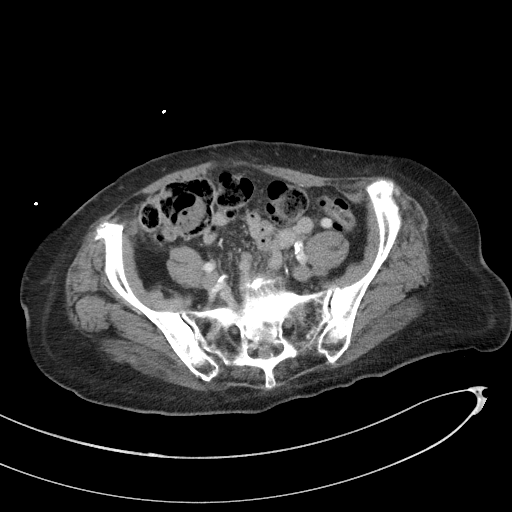
[im 42/80  soft-tissue]
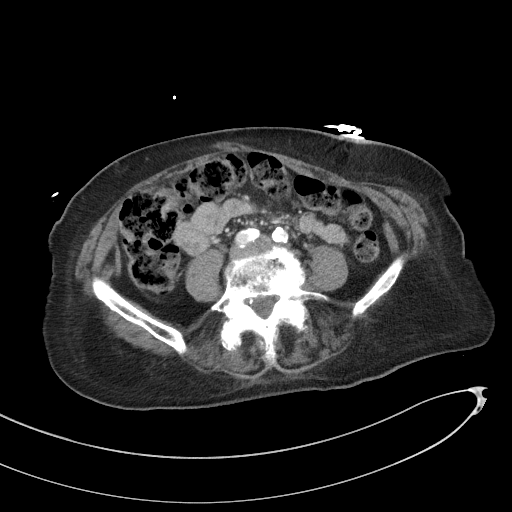
[im 46/80  soft-tissue]
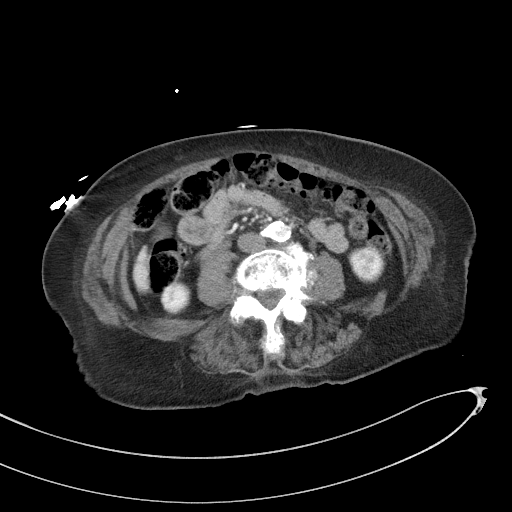
[im 50/80  soft-tissue]
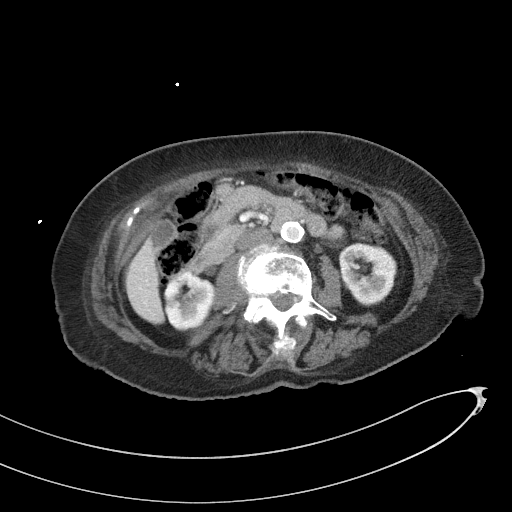
[im 50/80  bone]
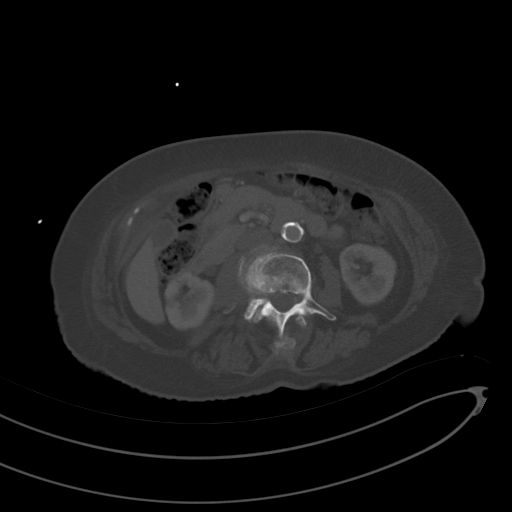
[im 59/80  soft-tissue]
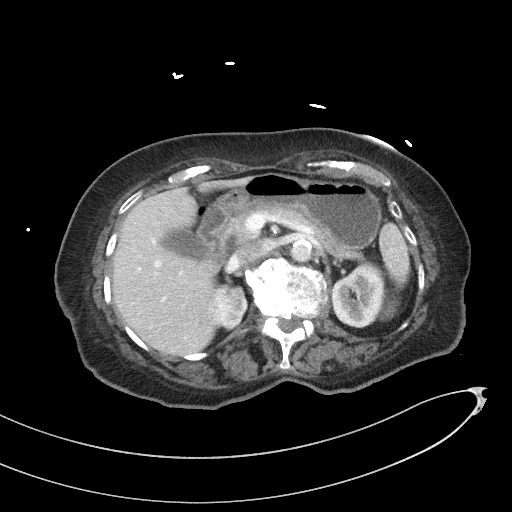
[im 63/80  soft-tissue]
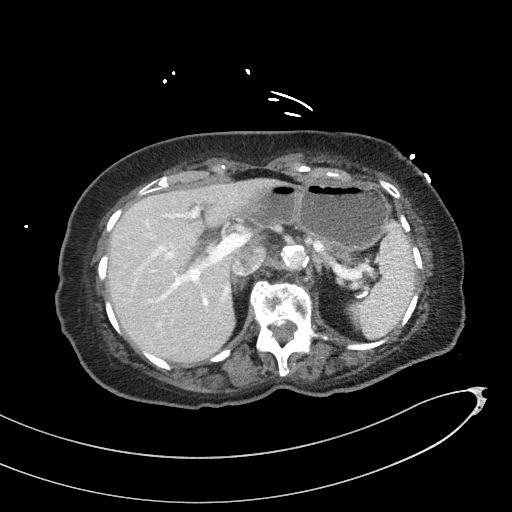
[im 67/80  soft-tissue]
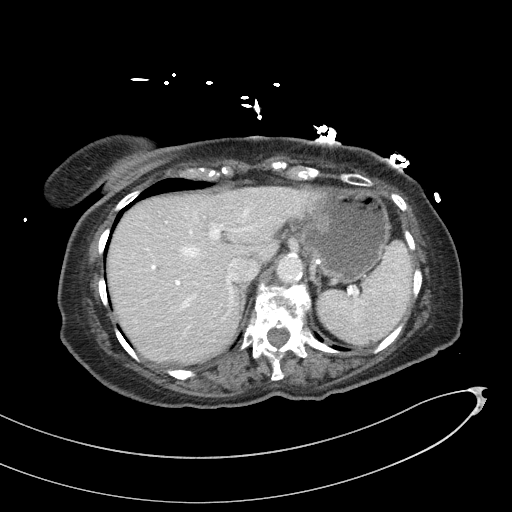
[im 75/80  soft-tissue]
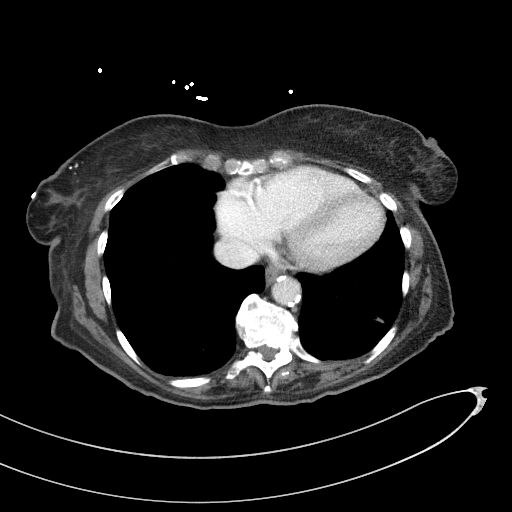

[Series 6: a/p w/ cor · coronal · 0.70mm/px · 3 of 129 slices shown]
[im 43/129  soft-tissue]
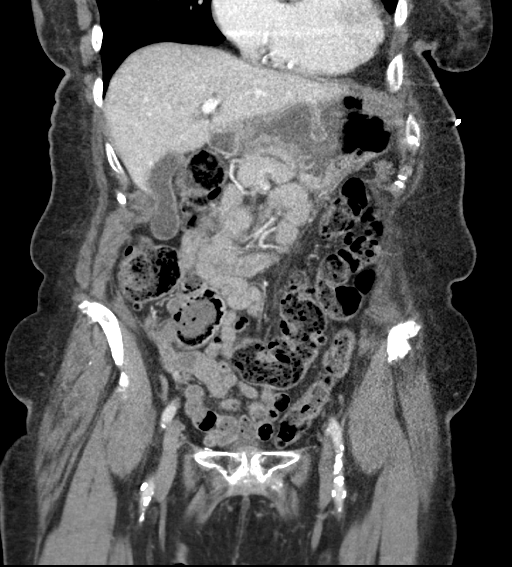
[im 57/129  soft-tissue]
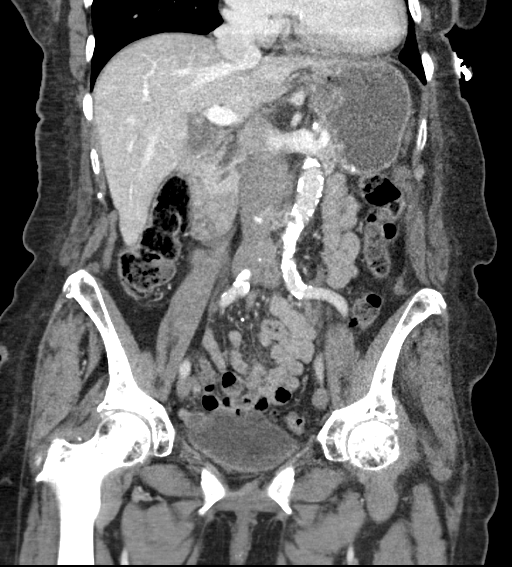
[im 72/129  soft-tissue]
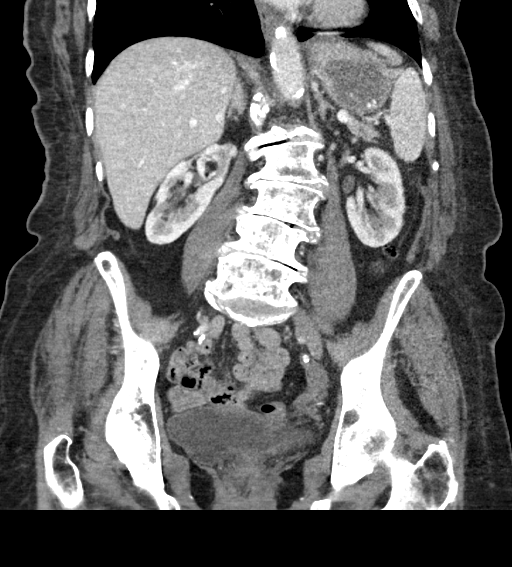

[16 of 46 positions shown; findings below may reference images not displayed]

FINDINGS: Lower chest: Mild dependent atelectasis of posterior lung bases are
noted. Heart size mildly enlarged.

Hepatobiliary: No focal liver abnormality is seen. No gallstones,
gallbladder wall thickening, or biliary dilatation.

Pancreas: Unremarkable. No pancreatic ductal dilatation or
surrounding inflammatory changes.

Spleen: Normal in size without focal abnormality.

Adrenals/Urinary Tract: Bilateral adrenal glands are normal.
Scarring of right kidney is identified. Left kidney cyst is noted.
There is no hydronephrosis bilaterally. Bladder is normal.

Stomach/Bowel: Stomach is within normal limits. Appendix is not seen
but no inflammation is noted around cecum. No evidence of bowel wall
thickening, distention, or inflammatory changes. There is
diverticulosis of colon without diverticulitis

Vascular/Lymphatic: Aortic atherosclerosis. No enlarged abdominal or
pelvic lymph nodes.

Reproductive: Status post hysterectomy. No adnexal masses.

Other: None.

Musculoskeletal: Degenerative joint changes of the spine and
scoliosis of the spine are identified.
IMPRESSION: 1. No acute abnormality identified in the abdomen and pelvis.
2. Diverticulosis of colon without diverticulitis.
3. Aortic atherosclerosis.

Aortic Atherosclerosis ([id number].M).

## 2020-09-01 NOTE — Progress Notes (Signed)
HPI: FU CAD; seen in the past for chest pain and near syncope. An echocardiogram performed in March of 2011 showed normalEF. There was mild aortic and mitral regurgitation. A CardioNet monitor was performed in November of 2011 related to syncope and revealed sinus with pacs. LHC 06/30/11: LAD diffuse 40-50%, circumflex irregular with less than 10% stenosis, mid RCA 50-60%, EF 55-65%. Medical therapy was recommended. Carotid dopplers January 2016 showed no significant stenosis.  Echocardiogram December 2021 showed normal LV function, grade 2 diastolic dysfunction, mild right ventricular enlargement, mild to moderate mitral regurgitation, mild mitral stenosis with mean gradient 4 mmHg, mild to moderate aortic stenosis with mean gradient 12 mmHg, trace aortic insufficiency.  Since she was last seen,she denies increased dyspnea, chest pain, palpitations or syncope.  She has less energy at times.  Current Outpatient Medications  Medication Sig Dispense Refill  . allopurinol (ZYLOPRIM) 100 MG tablet Take 50 mg by mouth daily.     Marland Kitchen aspirin 81 MG tablet Take 81 mg by mouth daily.      . Bevacizumab (AVASTIN IV) Inject into the vein. Injection in left eye. Every 4 weeks.    . bisoprolol (ZEBETA) 5 MG tablet TAKE 1/2 TABLET (2.5 MG TOTAL) BY MOUTH 2 (TWO) TIMES DAILY. (Patient taking differently: Take 2.5 mg by mouth 2 (two) times a day.) 90 tablet 0  . Calcium Carbonate (CALTRATE 600 PO) Take 100 mg by mouth at bedtime.     . diazepam (VALIUM) 5 MG tablet Take 5 mg by mouth as needed (as needed for eye injection).     . Iron-Vitamins (GERITOL COMPLETE) TABS Take 1 tablet by mouth daily.     Marland Kitchen lisinopril (ZESTRIL) 40 MG tablet Take 40 mg by mouth daily.    . methocarbamol (ROBAXIN) 500 MG tablet Take 1 tablet (500 mg total) by mouth 2 (two) times daily. 20 tablet 0  . moxifloxacin (VIGAMOX) 0.5 % ophthalmic solution Place 1 drop into both eyes as needed. Takes with injection in the eye    . Multiple  Vitamins-Minerals (CENTRUM SILVER ULTRA MENS) TABS Take 1 tablet by mouth daily.     . Multiple Vitamins-Minerals (OCUVITE PRESERVISION) TABS Take 1 tablet by mouth 2 (two) times a day.     . pioglitazone (ACTOS) 15 MG tablet Take 2 tablets (30 mg total) by mouth daily. SEE COMMENT (Patient taking differently: Take 15 mg by mouth daily.)    . Polyethyl Glycol-Propyl Glycol 0.4-0.3 % SOLN Place 1 drop into the right eye daily.     . simvastatin (ZOCOR) 40 MG tablet Take 1 tablet (40 mg total) by mouth at bedtime. 90 tablet 3  . amLODipine (NORVASC) 2.5 MG tablet Take 1 tablet (2.5 mg total) by mouth daily. (Patient not taking: No sig reported) 90 tablet 3   No current facility-administered medications for this visit.     Past Medical History:  Diagnosis Date  . Anemia    Felt secondary renal insuffiency, seen by Dr. Ralene Ok - Workup in the past has included normal iron, folate, B12, serum and urine protein electrophoresis as well as an ANA. Peripheral blood smear had been normal  . Carotid arterial disease ([place])    Last dopplers 05/9526 - RICA 41-32%, LICA 4-40% for recheck in 1 year  . Coronary artery disease    Moderate nonobstructive disease by cath 06/2011 (ruled out for MI/PE at that time)  . Dyslipidemia   . Esophageal reflux   . Gout   .  Macular degeneration (senile) of retina, unspecified   . Osteoporosis   . Other and unspecified hyperlipidemia   . Pancytopenia 2010   Intermittent mild leukopenia and thrombocytopenia felt to be most likely due to immune dysregulation per Dr. Ralene Ok  . Type II or unspecified type diabetes mellitus without mention of complication, not stated as uncontrolled   . Unspecified essential hypertension     Past Surgical History:  Procedure Laterality Date  . abdominal cyst    . ABDOMINAL HYSTERECTOMY     cyst removal  . APPENDECTOMY     age 39  . LEFT HEART CATHETERIZATION WITH CORONARY ANGIOGRAM N/A 06/30/2011   Procedure: LEFT HEART  CATHETERIZATION WITH CORONARY ANGIOGRAM;  Surgeon: Peter M Martinique, MD;  Location: [facility] CATH LAB;  Service: Cardiovascular;  Laterality: N/A;    Social History   Socioeconomic History  . Marital status: Widowed    Spouse name: Not on file  . Number of children: Not on file  . Years of education: Not on file  . Highest education level: Not on file  Occupational History  . Not on file  Tobacco Use  . Smoking status: Never Smoker  . Smokeless tobacco: Never Used  Vaping Use  . Vaping Use: Never used  Substance and Sexual Activity  . Alcohol use: No  . Drug use: No  . Sexual activity: Never    Birth control/protection: Post-menopausal  Other Topics Concern  . Not on file  Social History Narrative   Lives in [place] and lives alone, retired form [employer] work.    Social Determinants of Health   Financial Resource Strain: Not on file  Food Insecurity: Not on file  Transportation Needs: Not on file  Physical Activity: Not on file  Stress: Not on file  Social Connections: Not on file  Intimate Partner Violence: Not on file    Family History  Problem Relation Age of Onset  . Heart attack Father        in his 109's.  . Coronary artery disease Other        younger siblings  . Cancer Maternal Aunt        GYN    ROS: Some fatigue but no fevers or chills, productive cough, hemoptysis, dysphasia, odynophagia, melena, hematochezia, dysuria, hematuria, rash, seizure activity, orthopnea, PND, pedal edema, claudication. Remaining systems are negative.  Physical Exam: Well-developed well-nourished in no acute distress.  Skin is warm and dry.  HEENT is normal.  Neck is supple.  Chest is clear to auscultation with normal expansion.  Cardiovascular exam is regular rate and rhythm.  2/6 systolic murmur left sternal border. Abdominal exam nontender or distended. No masses palpated. Extremities show no edema. neuro grossly intact   A/P  1 coronary artery disease-patient  denies chest pain.  Continue aspirin and statin.  2 mild to moderate aortic stenosis-she will need follow-up echocardiograms in the future.  3 hypertension-blood pressure elevated; however she follows this at home and it is typically controlled.  Continue present medications and follow.  4 hyperlipidemia-continue statin.  5 carotid artery disease-continue aspirin and statin.  Most recent Dopplers with minimal disease.  Kirk Ruths, MD

## 2020-09-02 ENCOUNTER — Other Ambulatory Visit: Payer: Self-pay

## 2020-09-02 ENCOUNTER — Other Ambulatory Visit: Payer: Medicare HMO

## 2020-09-02 DIAGNOSIS — E7800 Pure hypercholesterolemia, unspecified: Secondary | ICD-10-CM | POA: Diagnosis not present

## 2020-09-04 LAB — HEPATIC FUNCTION PANEL
ALT: 13 IU/L (ref 0–32)
AST: 23 IU/L (ref 0–40)
Albumin: 4.4 g/dL (ref 3.6–4.6)
Alkaline Phosphatase: 62 IU/L (ref 44–121)
Bilirubin Total: 0.3 mg/dL (ref 0.0–1.2)
Bilirubin, Direct: <0.1 mg/dL (ref 0.00–0.40)
Total Protein: 7 g/dL (ref 6.0–8.5)

## 2020-09-04 LAB — LIPID PANEL
Chol/HDL Ratio: 2.8 ratio (ref 0.0–4.4)
Cholesterol, Total: 140 mg/dL (ref 100–199)
HDL: 50 mg/dL (ref >39)
LDL Chol Calc (NIH): 68 mg/dL (ref 0–99)
Triglycerides: 124 mg/dL (ref 0–149)
VLDL Cholesterol Cal: 22 mg/dL (ref 5–40)

## 2020-09-05 ENCOUNTER — Encounter: Payer: Self-pay | Admitting: *Deleted

## 2020-09-12 ENCOUNTER — Encounter: Payer: Self-pay | Admitting: Cardiology

## 2020-09-12 ENCOUNTER — Ambulatory Visit: Payer: Medicare HMO | Admitting: Cardiology

## 2020-09-12 ENCOUNTER — Other Ambulatory Visit: Payer: Self-pay

## 2020-09-12 VITALS — BP 172/60 | HR 66 | Ht 60.0 in | Wt 121.2 lb

## 2020-09-12 DIAGNOSIS — I2510 Atherosclerotic heart disease of native coronary artery without angina pectoris: Secondary | ICD-10-CM | POA: Diagnosis not present

## 2020-09-12 DIAGNOSIS — I10 Essential (primary) hypertension: Secondary | ICD-10-CM | POA: Diagnosis not present

## 2020-09-12 DIAGNOSIS — E7800 Pure hypercholesterolemia, unspecified: Secondary | ICD-10-CM

## 2020-09-12 NOTE — Patient Instructions (Signed)

## 2020-09-18 ENCOUNTER — Other Ambulatory Visit: Payer: Self-pay

## 2020-09-18 ENCOUNTER — Encounter (INDEPENDENT_AMBULATORY_CARE_PROVIDER_SITE_OTHER): Payer: Medicare HMO | Admitting: Ophthalmology

## 2020-09-18 DIAGNOSIS — H43813 Vitreous degeneration, bilateral: Secondary | ICD-10-CM

## 2020-09-18 DIAGNOSIS — H353231 Exudative age-related macular degeneration, bilateral, with active choroidal neovascularization: Secondary | ICD-10-CM

## 2020-09-18 DIAGNOSIS — I10 Essential (primary) hypertension: Secondary | ICD-10-CM | POA: Diagnosis not present

## 2020-09-18 DIAGNOSIS — H35033 Hypertensive retinopathy, bilateral: Secondary | ICD-10-CM | POA: Diagnosis not present

## 2020-10-10 DIAGNOSIS — E119 Type 2 diabetes mellitus without complications: Secondary | ICD-10-CM | POA: Diagnosis not present

## 2020-10-10 DIAGNOSIS — E7801 Familial hypercholesterolemia: Secondary | ICD-10-CM | POA: Diagnosis not present

## 2020-10-10 DIAGNOSIS — D61818 Other pancytopenia: Secondary | ICD-10-CM | POA: Diagnosis not present

## 2020-10-14 DIAGNOSIS — D61818 Other pancytopenia: Secondary | ICD-10-CM | POA: Diagnosis not present

## 2020-10-14 DIAGNOSIS — E7800 Pure hypercholesterolemia, unspecified: Secondary | ICD-10-CM | POA: Diagnosis not present

## 2020-10-14 DIAGNOSIS — E119 Type 2 diabetes mellitus without complications: Secondary | ICD-10-CM | POA: Diagnosis not present

## 2020-10-14 DIAGNOSIS — N1832 Chronic kidney disease, stage 3b: Secondary | ICD-10-CM | POA: Diagnosis not present

## 2020-10-14 DIAGNOSIS — I10 Essential (primary) hypertension: Secondary | ICD-10-CM | POA: Diagnosis not present

## 2020-10-14 DIAGNOSIS — E11319 Type 2 diabetes mellitus with unspecified diabetic retinopathy without macular edema: Secondary | ICD-10-CM | POA: Diagnosis not present

## 2020-10-14 DIAGNOSIS — I700 Atherosclerosis of aorta: Secondary | ICD-10-CM | POA: Diagnosis not present

## 2020-10-14 DIAGNOSIS — I2510 Atherosclerotic heart disease of native coronary artery without angina pectoris: Secondary | ICD-10-CM | POA: Diagnosis not present

## 2020-10-27 ENCOUNTER — Encounter: Payer: Self-pay | Admitting: *Deleted

## 2020-11-06 ENCOUNTER — Other Ambulatory Visit: Payer: Self-pay

## 2020-11-06 ENCOUNTER — Encounter (INDEPENDENT_AMBULATORY_CARE_PROVIDER_SITE_OTHER): Payer: Medicare HMO | Admitting: Ophthalmology

## 2020-11-06 DIAGNOSIS — H43813 Vitreous degeneration, bilateral: Secondary | ICD-10-CM

## 2020-11-06 DIAGNOSIS — I10 Essential (primary) hypertension: Secondary | ICD-10-CM | POA: Diagnosis not present

## 2020-11-06 DIAGNOSIS — H35033 Hypertensive retinopathy, bilateral: Secondary | ICD-10-CM

## 2020-11-06 DIAGNOSIS — H353231 Exudative age-related macular degeneration, bilateral, with active choroidal neovascularization: Secondary | ICD-10-CM

## 2020-11-09 DIAGNOSIS — H1820 Unspecified corneal edema: Secondary | ICD-10-CM | POA: Diagnosis not present

## 2020-11-09 DIAGNOSIS — Z961 Presence of intraocular lens: Secondary | ICD-10-CM | POA: Diagnosis not present

## 2020-11-09 DIAGNOSIS — H401134 Primary open-angle glaucoma, bilateral, indeterminate stage: Secondary | ICD-10-CM | POA: Diagnosis not present

## 2020-11-09 DIAGNOSIS — H5711 Ocular pain, right eye: Secondary | ICD-10-CM | POA: Diagnosis not present

## 2020-11-10 DIAGNOSIS — H3561 Retinal hemorrhage, right eye: Secondary | ICD-10-CM | POA: Diagnosis not present

## 2020-11-10 DIAGNOSIS — H353231 Exudative age-related macular degeneration, bilateral, with active choroidal neovascularization: Secondary | ICD-10-CM | POA: Diagnosis not present

## 2020-11-10 DIAGNOSIS — H1820 Unspecified corneal edema: Secondary | ICD-10-CM | POA: Diagnosis not present

## 2020-11-10 DIAGNOSIS — H401134 Primary open-angle glaucoma, bilateral, indeterminate stage: Secondary | ICD-10-CM | POA: Diagnosis not present

## 2020-11-11 ENCOUNTER — Encounter (INDEPENDENT_AMBULATORY_CARE_PROVIDER_SITE_OTHER): Payer: Medicare HMO | Admitting: Ophthalmology

## 2020-11-11 ENCOUNTER — Other Ambulatory Visit: Payer: Self-pay

## 2020-11-11 DIAGNOSIS — H43813 Vitreous degeneration, bilateral: Secondary | ICD-10-CM | POA: Diagnosis not present

## 2020-11-11 DIAGNOSIS — H338 Other retinal detachments: Secondary | ICD-10-CM | POA: Diagnosis not present

## 2020-11-11 DIAGNOSIS — H3561 Retinal hemorrhage, right eye: Secondary | ICD-10-CM

## 2020-11-11 DIAGNOSIS — H35033 Hypertensive retinopathy, bilateral: Secondary | ICD-10-CM

## 2020-11-11 DIAGNOSIS — H353231 Exudative age-related macular degeneration, bilateral, with active choroidal neovascularization: Secondary | ICD-10-CM | POA: Diagnosis not present

## 2020-11-11 DIAGNOSIS — I10 Essential (primary) hypertension: Secondary | ICD-10-CM

## 2020-11-12 ENCOUNTER — Encounter (INDEPENDENT_AMBULATORY_CARE_PROVIDER_SITE_OTHER): Payer: Medicare HMO | Admitting: Ophthalmology

## 2020-11-12 DIAGNOSIS — H401134 Primary open-angle glaucoma, bilateral, indeterminate stage: Secondary | ICD-10-CM | POA: Diagnosis not present

## 2020-11-12 DIAGNOSIS — H31401 Unspecified choroidal detachment, right eye: Secondary | ICD-10-CM | POA: Diagnosis not present

## 2020-11-12 DIAGNOSIS — H31411 Hemorrhagic choroidal detachment, right eye: Secondary | ICD-10-CM | POA: Diagnosis not present

## 2020-11-12 DIAGNOSIS — H3561 Retinal hemorrhage, right eye: Secondary | ICD-10-CM | POA: Diagnosis not present

## 2020-11-13 DIAGNOSIS — H401134 Primary open-angle glaucoma, bilateral, indeterminate stage: Secondary | ICD-10-CM | POA: Diagnosis not present

## 2020-11-13 DIAGNOSIS — H353231 Exudative age-related macular degeneration, bilateral, with active choroidal neovascularization: Secondary | ICD-10-CM | POA: Diagnosis not present

## 2020-11-13 DIAGNOSIS — H3561 Retinal hemorrhage, right eye: Secondary | ICD-10-CM | POA: Diagnosis not present

## 2020-11-13 DIAGNOSIS — H31401 Unspecified choroidal detachment, right eye: Secondary | ICD-10-CM | POA: Diagnosis not present

## 2020-11-14 ENCOUNTER — Encounter (INDEPENDENT_AMBULATORY_CARE_PROVIDER_SITE_OTHER): Payer: Medicare HMO | Admitting: Ophthalmology

## 2020-11-14 ENCOUNTER — Other Ambulatory Visit: Payer: Self-pay

## 2020-11-14 DIAGNOSIS — H353231 Exudative age-related macular degeneration, bilateral, with active choroidal neovascularization: Secondary | ICD-10-CM | POA: Diagnosis not present

## 2020-11-14 DIAGNOSIS — H35033 Hypertensive retinopathy, bilateral: Secondary | ICD-10-CM | POA: Diagnosis not present

## 2020-11-14 DIAGNOSIS — H31401 Unspecified choroidal detachment, right eye: Secondary | ICD-10-CM | POA: Diagnosis not present

## 2020-11-14 DIAGNOSIS — H31411 Hemorrhagic choroidal detachment, right eye: Secondary | ICD-10-CM | POA: Diagnosis not present

## 2020-11-14 DIAGNOSIS — H3561 Retinal hemorrhage, right eye: Secondary | ICD-10-CM | POA: Diagnosis not present

## 2020-11-14 DIAGNOSIS — H1820 Unspecified corneal edema: Secondary | ICD-10-CM | POA: Diagnosis not present

## 2020-11-14 DIAGNOSIS — H43813 Vitreous degeneration, bilateral: Secondary | ICD-10-CM | POA: Diagnosis not present

## 2020-11-14 DIAGNOSIS — H338 Other retinal detachments: Secondary | ICD-10-CM

## 2020-11-14 DIAGNOSIS — I10 Essential (primary) hypertension: Secondary | ICD-10-CM

## 2020-11-14 DIAGNOSIS — H401134 Primary open-angle glaucoma, bilateral, indeterminate stage: Secondary | ICD-10-CM | POA: Diagnosis not present

## 2020-11-17 DIAGNOSIS — H353231 Exudative age-related macular degeneration, bilateral, with active choroidal neovascularization: Secondary | ICD-10-CM | POA: Diagnosis not present

## 2020-11-17 DIAGNOSIS — H31411 Hemorrhagic choroidal detachment, right eye: Secondary | ICD-10-CM | POA: Diagnosis not present

## 2020-11-17 DIAGNOSIS — H401134 Primary open-angle glaucoma, bilateral, indeterminate stage: Secondary | ICD-10-CM | POA: Diagnosis not present

## 2020-11-17 NOTE — Progress Notes (Signed)
[facility] Note  11/18/2020     CHIEF COMPLAINT Patient presents for Retina Follow Up   HISTORY OF PRESENT ILLNESS: Patricia Moran is a 87 y.o. female who presents to the clinic today for:   HPI     Retina Follow Up   Patient presents with  Other.  In right eye.        Comments   Patient here for Retina follow  up for [name]. Patient states vision about the same. No eye pain. Dr [name] added Prednisone gtt QID OD. Started this am. He took her off Timolol. Still using Brimonidine and Dorzolamide.  Still taking Prednisone pill 20 mg QD.      Last edited by [name], COA on 11/18/2020  9:41 AM.    Pt is a regular injection pt of Dr. Zigmund Daniel, pt saw Dr. [name] while on call for eye pain and pressure, IOP was 80 at Dr. Roda Shutters office, IOP improved medically, but pt developed choroidal hemorrhage OD. pt saw Dr. Zigmund Daniel last Friday (07.29.22) and Dr. [name] yesterday (08.01.22), pt is on PF QID OD and brimonidine and dorzolamide BID OU, pt is also taking po Pred '20mg'$  once a day, pt states vision has not improved that much from last Friday, pts next appt with Dr. [name] is Thursday (08.04.22)  Referring physician: [name], MD [address] [place],  [place] [address]  HISTORICAL INFORMATION:   Selected notes from the MEDICAL RECORD NUMBER Dr. Zigmund Daniel pt.  Ok per [name].   CURRENT MEDICATIONS: Current Outpatient Medications (Ophthalmic Drugs)  Medication Sig   moxifloxacin (VIGAMOX) 0.5 % ophthalmic solution Place 1 drop into both eyes as needed. Takes with injection in the eye   Polyethyl Glycol-Propyl Glycol 0.4-0.3 % SOLN Place 1 drop into the right eye daily.    No current facility-administered medications for this visit. (Ophthalmic Drugs)   Current Outpatient Medications (Other)  Medication Sig   allopurinol (ZYLOPRIM) 100 MG tablet Take 50 mg by mouth daily.    amLODipine (NORVASC) 2.5 MG tablet Take 1 tablet (2.5  mg total) by mouth daily. (Patient not taking: No sig reported)   aspirin 81 MG tablet Take 81 mg by mouth daily.     Bevacizumab (AVASTIN IV) Inject into the vein. Injection in left eye. Every 4 weeks.   bisoprolol (ZEBETA) 5 MG tablet TAKE 1/2 TABLET (2.5 MG TOTAL) BY MOUTH 2 (TWO) TIMES DAILY. (Patient taking differently: Take 2.5 mg by mouth 2 (two) times a day.)   Calcium Carbonate (CALTRATE 600 PO) Take 100 mg by mouth at bedtime.    diazepam (VALIUM) 5 MG tablet Take 5 mg by mouth as needed (as needed for eye injection).    Iron-Vitamins (GERITOL COMPLETE) TABS Take 1 tablet by mouth daily.    lisinopril (ZESTRIL) 40 MG tablet Take 40 mg by mouth daily.   methocarbamol (ROBAXIN) 500 MG tablet Take 1 tablet (500 mg total) by mouth 2 (two) times daily.   Multiple Vitamins-Minerals (CENTRUM SILVER ULTRA MENS) TABS Take 1 tablet by mouth daily.    Multiple Vitamins-Minerals (OCUVITE PRESERVISION) TABS Take 1 tablet by mouth 2 (two) times a day.    pioglitazone (ACTOS) 15 MG tablet Take 2 tablets (30 mg total) by mouth daily. SEE COMMENT (Patient taking differently: Take 15 mg by mouth daily.)   simvastatin (ZOCOR) 40 MG tablet Take 1 tablet (40 mg total) by mouth at bedtime.  No current facility-administered medications for this visit. (Other)      REVIEW OF SYSTEMS: ROS   Positive for: Endocrine, Eyes Negative for: Constitutional, Gastrointestinal, Neurological, Skin, Genitourinary, Musculoskeletal, HENT, Cardiovascular, Respiratory, Psychiatric, Allergic/Imm, Heme/Lymph Last edited by [name], COA on 11/18/2020  9:43 AM.       ALLERGIES Allergies  Allergen Reactions   Hydrocodone Other (See Comments)    unknown   Nitroglycerin     Pt states it makes her pass out   Ofloxacin Other (See Comments)    unknown   Sulfonamide Derivatives Other (See Comments)    unknown    PAST MEDICAL HISTORY Past Medical History:  Diagnosis Date   Anemia    Felt secondary renal  insuffiency, seen by Dr. Ralene Ok - Workup in the past has included normal iron, folate, B12, serum and urine protein electrophoresis as well as an ANA. Peripheral blood smear had been normal   Carotid arterial disease (HCC)    Last dopplers [id number] - RICA [id number], LICA [id number] for recheck in 1 year   Coronary artery disease    Moderate nonobstructive disease by cath 06/2011 (ruled out for MI/PE at that time)   Dyslipidemia    Esophageal reflux    Gout    Macular degeneration (senile) of retina, unspecified    Osteoporosis    Other and unspecified hyperlipidemia    Pancytopenia 2010   Intermittent mild leukopenia and thrombocytopenia felt to be most likely due to immune dysregulation per Dr. Ralene Ok   Type II or unspecified type diabetes mellitus without mention of complication, not stated as uncontrolled    Unspecified essential hypertension    Past Surgical History:  Procedure Laterality Date   abdominal cyst     ABDOMINAL HYSTERECTOMY     cyst removal   APPENDECTOMY     age 30   LEFT HEART CATHETERIZATION WITH CORONARY ANGIOGRAM N/A 06/30/2011   Procedure: LEFT HEART CATHETERIZATION WITH CORONARY ANGIOGRAM;  Surgeon: Peter M Martinique, MD;  Location: [facility] CATH LAB;  Service: Cardiovascular;  Laterality: N/A;    FAMILY HISTORY Family History  Problem Relation Age of Onset   Heart attack Father        in his 79's.   Coronary artery disease Other        younger siblings   Cancer Maternal Aunt        GYN    SOCIAL HISTORY Social History   Tobacco Use   Smoking status: Never   Smokeless tobacco: Never  Vaping Use   Vaping Use: Never used  Substance Use Topics   Alcohol use: No   Drug use: No         OPHTHALMIC EXAM:  Base Eye Exam     Visual Acuity (Snellen - Linear)       Right Left   Dist cc HM 20/150 -1   Dist ph cc NI NI         Tonometry (Tonopen, 9:32 AM)       Right Left   Pressure 12 14         Pupils       Pupils Dark Light Shape React APD    Right PERRL 3 3 Round NR None   Left PERRL 2 1 Round Brisk None         Visual Fields (Counting fingers)       Left Right    Full    Restrictions  Total superior nasal, inferior nasal deficiencies; Partial inner superior  temporal, inferior temporal deficiencies         Extraocular Movement       Right Left    Full Full         Neuro/Psych     Oriented x3: Yes   Mood/Affect: Normal         Dilation     Both eyes: 1.0% Mydriacyl, 2.5% Phenylephrine @ 9:32 AM           Slit Lamp and Fundus Exam     Slit Lamp Exam       Right Left   Lids/Lashes Dermatochalasis - upper lid, Meibomian gland dysfunction Dermatochalasis - upper lid, Meibomian gland dysfunction   Conjunctiva/Sclera White and quiet White and quiet   Cornea 1+ Punctate epithelial erosions, mild arcus, trace Descemet's folds 1+ Punctate epithelial erosions, trace endo pigment   Anterior Chamber deep and clear, no cell or flare Deep and quiet   Iris Round and moderately dilated to 4.47m Round and moderately dilated to 5.011m  Lens PC IOL in good position PC IOL in good position, trace Posterior capsular opacification   Vitreous Vitreous syneresis, +cell Vitreous syneresis, Posterior vitreous detachment, vitreous condensations         Fundus Exam       Right Left   Disc mild Pallor, Sharp rim, sub retinal heme 270 superiorly mild Pallor, Pink and Sharp, Compact   C/D Ratio 0.2 0.1   Macula Blunted foveal reflex, sub retinal heme Blunted foveal reflex, Drusen, RPE mottling, clumping and atrophy, no heme   Vessels attenuated, Tortuous attenuated, Tortuous   Periphery 360 sub retinal heme slightly improved, +inferior SRF slightly improved from prior Attached, mild reticular degeneration, No heme            Refraction     Wearing Rx       Sphere Cylinder Axis Add   Right -0.25 +1.25 003 +2.75   Left -1.00 +1.75 127 +2.75         Manifest Refraction       Sphere Cylinder Axis Dist VA    Right -0.25 +1.25 005 20/HM   Left -1.00 +1.50 130 20/150-2            IMAGING AND PROCEDURES  Imaging and Procedures for 11/18/2020  OCT, Retina - OU - Both Eyes       Right Eye Quality was poor. Central Foveal Thickness: 125. Progression has been stable. Findings include abnormal foveal contour, no IRF, no SRF, subretinal hyper-reflective material ([facility]).   Left Eye Quality was good. Central Foveal Thickness: 251. Progression has improved. Findings include normal foveal contour, no IRF, no SRF, outer retinal atrophy, pigment epithelial detachment, subretinal hyper-reflective material.   Notes *Images captured and stored on drive  Diagnosis / Impression:    Clinical management:  See below  Abbreviations: NFP - Normal foveal profile. CME - cystoid macular edema. PED - pigment epithelial detachment. IRF - intraretinal fluid. SRF - subretinal fluid. EZ - ellipsoid zone. ERM - epiretinal membrane. ORA - outer retinal atrophy. ORT - outer retinal tubulation. SRHM - subretinal hyper-reflective material. IRHM - intraretinal hyper-reflective material      Color Fundus Photography Optos - OU - Both Eyes       Right Eye Progression has no prior data.   Left Eye Progression has no prior data.      Color Fundus Photography Optos - OU - Both Eyes       Right Eye Progression  has improved. Disc findings include hemorrhage (Peripapillary choroidal hemorrhage superiorly). Macula : hemorrhage. Vessels : attenuated, tortuous vessels. Periphery : hemorrhage, detachment (360 choroidal hemorrhage with overlying SRF inferiorly, slightly improved from prior).   Left Eye Progression has been stable. Disc findings include pallor. Macula : retinal pigment epithelium abnormalities, drusen, geographic atrophy. Vessels : attenuated, tortuous vessels. Periphery : RPE abnormality.   Notes **Images stored on drive**  Impression: OD: OS:               ASSESSMENT/PLAN:    ICD-10-CM   1. Vitreous hemorrhage of right eye (HCC)  H43.11     2. Retinal edema  H35.81 OCT, Retina - OU - Both Eyes    Color Fundus Photography Optos - OU - Both Eyes    3. Hemorrhagic choroidal detachment of right eye  H31.411     4. Advanced atrophic nonexudative age-related macular degeneration of both eyes with subfoveal involvement  H35.3134     5. Essential hypertension  I10     6. Hypertensive retinopathy of both eyes  H35.033     7. Pseudophakia, both eyes  Z96.1       1-3. Hemorrhagic choroidal detachment with exudative RD inferiorly OD  - slightly improved from last f/u with Dr. Zigmund Daniel on Friday, 7.29.22  - f/u Optos images obtained today showing mild decrease in [facility] and choroidal heme as well as inferior SRF  - IOP 12 on brim and dorzolamide BID OU  - currently on 20 mg po prednisone  - just started topical Pred-Forte QID OD  - continue present management per Dr. [name] and Dr. Zigmund Daniel  - pt to f/u with Dr. [name] on Thursday  - f/u here Friday, August 5, DFE, OCT  4. Age related macular degeneration, non-exudative, both eyes  - The incidence, anatomy, and pathology of dry AMD, risk of progression, and the AREDS and AREDS 2 study including smoking risks discussed with patient.  - cont amsler grid monitoring  - f/u per Dr. Zigmund Daniel  5,6. Hypertensive retinopathy OU - discussed importance of tight BP control - monitor  7. Pseudophakia OU  - s/p CE/IOL OU  - IOL in good position, doing well  - monitor  Ophthalmic Meds Ordered this visit:  No orders of the defined types were placed in this encounter.     Return for f/u Friday, vitreous hemorrhage OD, DFE, OCT, OPTOS.  There are no Patient Instructions on file for this visit.  Explained the diagnoses, plan, and follow up with the patient and they expressed understanding.  Patient expressed understanding of the importance of proper follow up care.   This document serves as a  record of services personally performed by Gardiner Sleeper, MD, PhD. It was created on their behalf by Estill Bakes, COT an ophthalmic technician. The creation of this record is the provider's dictation and/or activities during the visit.    Electronically signed by: Estill Bakes, COT 8.1.22 @ 12:55 PM   This document serves as a record of services personally performed by Gardiner Sleeper, MD, PhD. It was created on their behalf by San Jetty. Owens Shark, OA an ophthalmic technician. The creation of this record is the provider's dictation and/or activities during the visit.    Electronically signed by: San Jetty. Marguerita Merles 08.02.2022 12:55 PM   Gardiner Sleeper, M.D., Ph.D. Diseases & Surgery of the Retina and Vitreous [facility]  I have reviewed the above documentation for accuracy and completeness, and  I agree with the above. Gardiner Sleeper, M.D., Ph.D. 11/18/20 1:03 PM   Abbreviations: M myopia (nearsighted); A astigmatism; H hyperopia (farsighted); P presbyopia; Mrx spectacle prescription;  CTL contact lenses; OD right eye; OS left eye; OU both eyes  XT exotropia; ET esotropia; PEK punctate epithelial keratitis; PEE punctate epithelial erosions; DES dry eye syndrome; MGD meibomian gland dysfunction; ATs artificial tears; PFAT's preservative free artificial tears; [place] nuclear sclerotic cataract; PSC posterior subcapsular cataract; ERM epi-retinal membrane; PVD posterior vitreous detachment; RD retinal detachment; DM diabetes mellitus; DR diabetic retinopathy; NPDR non-proliferative diabetic retinopathy; PDR proliferative diabetic retinopathy; CSME clinically significant macular edema; DME diabetic macular edema; dbh dot blot hemorrhages; CWS cotton wool spot; POAG primary open angle glaucoma; C/D cup-to-disc ratio; HVF humphrey visual field; GVF goldmann visual field; OCT optical coherence tomography; IOP intraocular pressure; BRVO Branch retinal vein occlusion; CRVO central  retinal vein occlusion; CRAO central retinal artery occlusion; BRAO branch retinal artery occlusion; RT retinal tear; SB scleral buckle; PPV pars plana vitrectomy; VH Vitreous hemorrhage; PRP panretinal laser photocoagulation; IVK intravitreal kenalog; VMT vitreomacular traction; MH Macular hole;  NVD neovascularization of the disc; NVE neovascularization elsewhere; AREDS age related eye disease study; ARMD age related macular degeneration; POAG primary open angle glaucoma; EBMD epithelial/anterior basement membrane dystrophy; ACIOL anterior chamber intraocular lens; IOL intraocular lens; PCIOL posterior chamber intraocular lens; Phaco/IOL phacoemulsification with intraocular lens placement; [place] photorefractive keratectomy; LASIK laser assisted in situ keratomileusis; HTN hypertension; DM diabetes mellitus; COPD chronic obstructive pulmonary disease

## 2020-11-18 ENCOUNTER — Encounter (INDEPENDENT_AMBULATORY_CARE_PROVIDER_SITE_OTHER): Payer: Self-pay | Admitting: Ophthalmology

## 2020-11-18 ENCOUNTER — Ambulatory Visit (INDEPENDENT_AMBULATORY_CARE_PROVIDER_SITE_OTHER): Payer: Medicare HMO | Admitting: Ophthalmology

## 2020-11-18 ENCOUNTER — Other Ambulatory Visit: Payer: Self-pay

## 2020-11-18 DIAGNOSIS — I10 Essential (primary) hypertension: Secondary | ICD-10-CM

## 2020-11-18 DIAGNOSIS — H353134 Nonexudative age-related macular degeneration, bilateral, advanced atrophic with subfoveal involvement: Secondary | ICD-10-CM | POA: Diagnosis not present

## 2020-11-18 DIAGNOSIS — H3321 Serous retinal detachment, right eye: Secondary | ICD-10-CM | POA: Diagnosis not present

## 2020-11-18 DIAGNOSIS — Z961 Presence of intraocular lens: Secondary | ICD-10-CM

## 2020-11-18 DIAGNOSIS — H31411 Hemorrhagic choroidal detachment, right eye: Secondary | ICD-10-CM | POA: Diagnosis not present

## 2020-11-18 DIAGNOSIS — H35033 Hypertensive retinopathy, bilateral: Secondary | ICD-10-CM | POA: Diagnosis not present

## 2020-11-18 DIAGNOSIS — H3581 Retinal edema: Secondary | ICD-10-CM | POA: Diagnosis not present

## 2020-11-19 NOTE — Progress Notes (Signed)
[facility] - Clinic Note  11/21/2020     CHIEF COMPLAINT Patient presents for Retina Follow Up   HISTORY OF PRESENT ILLNESS: Patricia Moran is a 87 y.o. female who presents to the clinic today for:   HPI     Retina Follow Up   Patient presents with  Other.  In right eye.  This started 3 days ago.  Severity is severe.  Duration of 3 days.  Since onset it is stable.  I, the attending physician,  performed the HPI with the patient and updated documentation appropriately.        Comments   87 y/o female pt here for 3 day f/u for hemorrhagic choroidal detachment w/exudative RD inferiorly OD.  No change in VA OU.  Saw Dr. Glenn yesterday.  Denies pain, FOL, floaters.  Brimonidine BID OU, Dorzolamide BID OS, Pred QID OD, 20mg Pred PO QD.      Last edited by , , MD on 11/21/2020 12:22 PM.    Pt saw Dr. Glenn yesterday who told her to decrease her PF use to only TID instead of QID OD, he also stopped dorzolamide in the right eye, pt will see Dr. Glenn again on Monday (08.08.22), pt denies eye pain  Referring physician: Glenn, Steven J, MD [address] ,  [place] [address]  HISTORICAL INFORMATION:   Selected notes from the medical record:  Dr. Matthews pt.  Ok per Lisa J.   CURRENT MEDICATIONS: Current Outpatient Medications (Ophthalmic Drugs)  Medication Sig   brimonidine (ALPHAGAN) 0.2 % ophthalmic solution 1 drop into affected eye   dorzolamide (TRUSOPT) 2 % ophthalmic solution 1 drop into affected eye   moxifloxacin (VIGAMOX) 0.5 % ophthalmic solution Place 1 drop into both eyes as needed. Takes with injection in the eye   Polyethyl Glycol-Propyl Glycol 0.4-0.3 % SOLN Place 1 drop into the right eye daily.    prednisoLONE acetate (PRED FORTE) 1 % ophthalmic suspension SMARTSIG:In Eye(s)   No current facility-administered medications for this visit. (Ophthalmic Drugs)   Current Outpatient Medications (Other)  Medication Sig    acetaZOLAMIDE ER (DIAMOX) 500 MG capsule Take 500 mg by mouth every 12 (twelve) hours.   allopurinol (ZYLOPRIM) 100 MG tablet Take 50 mg by mouth daily.    allopurinol (ZYLOPRIM) 100 MG tablet TAKE 1/2 TABLET ONCE A DAY   aspirin 81 MG EC tablet 1 tablet   aspirin 81 MG tablet Take 81 mg by mouth daily.     Bevacizumab (AVASTIN IV) Inject into the vein. Injection in left eye. Every 4 weeks.   bisoprolol (ZEBETA) 5 MG tablet TAKE 1/2 TABLET (2.5 MG TOTAL) BY MOUTH 2 (TWO) TIMES DAILY. (Patient taking differently: Take 2.5 mg by mouth 2 (two) times a day.)   bisoprolol (ZEBETA) 5 MG tablet TAKE 1/2 TABLET BY MOUTH TWICE DAILY FOR 30 DAYS   Blood Glucose Monitoring Suppl (ONE TOUCH ULTRA 2) w/Device KIT USE AS DIRECTED BY PHYSICIAN FOR GLUCOSE MONITORING   Calcium Carbonate (CALTRATE 600 PO) Take 100 mg by mouth at bedtime.    diazepam (VALIUM) 5 MG tablet Take 5 mg by mouth as needed (as needed for eye injection).    Iron-Vitamins (GERITOL COMPLETE) TABS Take 1 tablet by mouth daily.    lisinopril (ZESTRIL) 40 MG tablet Take 40 mg by mouth daily.   lisinopril (ZESTRIL) 40 MG tablet Take 1 tablet by mouth daily.   methocarbamol (ROBAXIN) 500 MG tablet Take 1   tablet (500 mg total) by mouth 2 (two) times daily.   Multiple Vitamins-Minerals (CENTRUM SILVER ULTRA MENS) TABS Take 1 tablet by mouth daily.    Multiple Vitamins-Minerals (OCUVITE PRESERVISION) TABS Take 1 tablet by mouth 2 (two) times a day.    pioglitazone (ACTOS) 15 MG tablet Take 2 tablets (30 mg total) by mouth daily. SEE COMMENT (Patient taking differently: Take 15 mg by mouth daily.)   pioglitazone (ACTOS) 15 MG tablet 1 tablet   predniSONE (DELTASONE) 20 MG tablet SMARTSIG:1 Pill By Mouth Daily   simvastatin (ZOCOR) 40 MG tablet Take 1 tablet (40 mg total) by mouth at bedtime.   simvastatin (ZOCOR) 40 MG tablet take 1 tablet by mouth in the evening once a day   amLODipine (NORVASC) 2.5 MG tablet Take 1 tablet (2.5 mg total) by  mouth daily. (Patient not taking: No sig reported)   No current facility-administered medications for this visit. (Other)      REVIEW OF SYSTEMS: ROS   Positive for: Gastrointestinal, Endocrine, Cardiovascular, Eyes Negative for: Constitutional, Neurological, Skin, Genitourinary, Musculoskeletal, HENT, Respiratory, Psychiatric, Allergic/Imm, Heme/Lymph Last edited by [name], COA on 11/21/2020  8:29 AM.        ALLERGIES Allergies  Allergen Reactions   Hydrocodone Other (See Comments)    unknown   Nitroglycerin     Pt states it makes her pass out   Ofloxacin Other (See Comments)    unknown   Sulfonamide Derivatives Other (See Comments)    unknown    PAST MEDICAL HISTORY Past Medical History:  Diagnosis Date   Anemia    Felt secondary renal insuffiency, seen by Dr. Ralene Ok - Workup in the past has included normal iron, folate, B12, serum and urine protein electrophoresis as well as an ANA. Peripheral blood smear had been normal   Carotid arterial disease (HCC)    Last dopplers 11/5460 - RICA 70-35%, LICA 0-09% for recheck in 1 year   Coronary artery disease    Moderate nonobstructive disease by cath 06/2011 (ruled out for MI/PE at that time)   Dyslipidemia    Esophageal reflux    Gout    Hypertensive retinopathy    Macular degeneration (senile) of retina, unspecified    Osteoporosis    Other and unspecified hyperlipidemia    Pancytopenia 04/19/2008   Intermittent mild leukopenia and thrombocytopenia felt to be most likely due to immune dysregulation per Dr. Ralene Ok   Type II or unspecified type diabetes mellitus without mention of complication, not stated as uncontrolled    Unspecified essential hypertension    Past Surgical History:  Procedure Laterality Date   abdominal cyst     ABDOMINAL HYSTERECTOMY     cyst removal   APPENDECTOMY     age 69   CATARACT EXTRACTION     EYE SURGERY     LEFT HEART CATHETERIZATION WITH CORONARY ANGIOGRAM N/A 06/30/2011    Procedure: LEFT HEART CATHETERIZATION WITH CORONARY ANGIOGRAM;  Surgeon: Peter M Martinique, MD;  Location: [facility] CATH LAB;  Service: Cardiovascular;  Laterality: N/A;    FAMILY HISTORY Family History  Problem Relation Age of Onset   Heart attack Father        in his 37's.   Coronary artery disease Other        younger siblings   Cancer Maternal Aunt        GYN    SOCIAL HISTORY Social History   Tobacco Use   Smoking status: Never   Smokeless tobacco: Never  Vaping Use   Vaping Use: Never used  Substance Use Topics   Alcohol use: No   Drug use: No         OPHTHALMIC EXAM:  Base Eye Exam     Visual Acuity (Snellen - Linear)       Right Left   Dist cc LP 20/100 -2   Dist ph cc NI NI    Correction: Glasses         Tonometry (Tonopen, 8:34 AM)       Right Left   Pressure 10 9         Pupils       Dark Light Shape React APD   Right 2 3 Round Brisk +1   Left 2 1 Round Minimal None         Visual Fields (Counting fingers)       Left Right    Full    Restrictions  Total superior temporal, inferior temporal, superior nasal, inferior nasal deficiencies         Extraocular Movement       Right Left    Full Full         Neuro/Psych     Oriented x3: Yes   Mood/Affect: Normal         Dilation     Both eyes: 1.0% Mydriacyl, 2.5% Phenylephrine @ 8:34 AM           Slit Lamp and Fundus Exam     Slit Lamp Exam       Right Left   Lids/Lashes Dermatochalasis - upper lid, Meibomian gland dysfunction Dermatochalasis - upper lid, Meibomian gland dysfunction   Conjunctiva/Sclera White and quiet White and quiet   Cornea Trace Punctate epithelial erosions, mild arcus 1+ Punctate epithelial erosions, trace endo pigment   Anterior Chamber deep and clear, no cell or flare Deep and quiet   Iris Round and poorly dilated to 4.65m Round and moderately dilated to 5.058m  Lens PC IOL in good position PC IOL in good position, trace Posterior capsular  opacification   Vitreous Vitreous syneresis, +cell; diffuse VH greatest centrally Vitreous syneresis, Posterior vitreous detachment, vitreous condensations         Fundus Exam       Right Left   Disc Very hazy view mild Pallor, Pink and Sharp, Compact   C/D Ratio 0.2 0.1   Macula no view Blunted foveal reflex, Drusen, RPE mottling, clumping and atrophy, no heme   Vessels attenuated, Tortuous attenuated, Tortuous   Periphery 360 sub retinal heme slightly improved, +inferior SRF slightly improved from prior Attached, mild reticular degeneration, No heme             IMAGING AND PROCEDURES  Imaging and Procedures for 11/21/2020  OCT, Retina - OU - Both Eyes       [facility] Eye Findings include ([facility]).   Left Eye Quality was good. Central Foveal Thickness: 251. Progression has been stable. Findings include normal foveal contour, no IRF, no SRF, outer retinal atrophy, pigment epithelial detachment, subretinal hyper-reflective material.   Notes *Images captured and stored on drive  Diagnosis / Impression:  OD: no image obtained today OS: nonexudative ARMD  Clinical management:  See below  Abbreviations: NFP - Normal foveal profile. CME - cystoid macular edema. PED - pigment epithelial detachment. IRF - intraretinal fluid. SRF - subretinal fluid. EZ - ellipsoid zone. ERM - epiretinal membrane. ORA - outer retinal atrophy. ORT - outer retinal  tubulation. SRHM - subretinal hyper-reflective material. IRHM - intraretinal hyper-reflective material      Color Fundus Photography Optos - OU - Both Eyes       Right Eye Progression has worsened. Disc findings include hemorrhage (Peripapillary choroidal hemorrhage superiorly). Macula : hemorrhage. Vessels : attenuated, tortuous vessels. Periphery : hemorrhage, detachment (360 choroidal hemorrhage with overlying SRF inferiorly, slightly improved from prior).   Left Eye Progression has been stable. Disc  findings include pallor. Macula : retinal pigment epithelium abnormalities, drusen, geographic atrophy. Vessels : attenuated, tortuous vessels. Periphery : RPE abnormality.   Notes **Images stored on drive**  Impression: OD: vitreous and choroidal hemorrhage -- no clear view of posterior pole OS: non exudative ARMD -- no heme      B-Scan Ultrasound - OD - Right Eye       Quality was good. Findings included vitreous opacities, vitreous hemorrhage, choroidal detachment.   Notes **Images stored on drive**  Impression: OD: stable, low choroidals; persistent SRF inferiorly overlying choroidals; new mobile vitreous opacities consistent with vitreous hemorrhage.              ASSESSMENT/PLAN:    ICD-10-CM   1. Hemorrhagic choroidal detachment of right eye  H31.411 Color Fundus Photography Optos - OU - Both Eyes    B-Scan Ultrasound - OD - Right Eye    2. Exudative retinal detachment of right eye  H33.21 B-Scan Ultrasound - OD - Right Eye    3. Vitreous hemorrhage, right eye (HCC)  H43.11 Color Fundus Photography Optos - OU - Both Eyes    B-Scan Ultrasound - OD - Right Eye    4. Retinal edema  H35.81 OCT, Retina - OU - Both Eyes    5. Advanced atrophic nonexudative age-related macular degeneration of both eyes with subfoveal involvement  H35.3134     6. Essential hypertension  I10     7. Hypertensive retinopathy of both eyes  H35.033     8. Pseudophakia, both eyes  Z96.1       1-4. Hemorrhagic choroidal detachment with exudative RD inferiorly OD  - today with new vitreous hemorrhage OD  - BCVA LP from HM  - f/u Optos images with posterior pole obscured  - b-scan 8.5.22 - stable, low choroidals; persistent SRF inferiorly overlying choroidals; new mobile vitreous opacities consistent with vitreous hemorrhage.  - saw Dr. [name] yesterday who stopped dorzolamide OD  - IOP 10 on brim BID OU and dorzolamide BID OS only  - currently on 20 mg po prednisone -- continue  -  topical Pred-Forte QID OD per Dr. [name] -- continue  - will start cyclopentolate BID OD  - discussed case with Dr. [name] -- will have pt stop brimonidine OD on Sunday  - pt to f/u with Dr. [name] on Monday  - f/u here on Tuesday with JDM or myself -- DFE, OCT  5. Age related macular degeneration, non-exudative, both eyes  - The incidence, anatomy, and pathology of dry AMD, risk of progression, and the AREDS and AREDS 2 study including smoking risks discussed with patient.  - cont amsler grid monitoring   6,7. Hypertensive retinopathy OU - discussed importance of tight BP control - monitor  7. Pseudophakia OU  - s/p CE/IOL OU  - IOL in good position, doing well  - monitor  Ophthalmic Meds Ordered this visit:  No orders of the defined types were placed in this encounter.     Return in about 4 days (around 11/25/2020) for f/u hemorrhagic  chorodal detachment OD, DFE, OCT.  There are no Patient Instructions on file for this visit.  Explained the diagnoses, plan, and follow up with the patient and they expressed understanding.  Patient expressed understanding of the importance of proper follow up care.   This document serves as a record of services personally performed by Gardiner Sleeper, MD, PhD. It was created on their behalf by San Jetty. Owens Shark, OA an ophthalmic technician. The creation of this record is the provider's dictation and/or activities during the visit.    Electronically signed by: San Jetty. Marguerita Merles 08.03.2022 12:28 PM  Gardiner Sleeper, M.D., Ph.D. Diseases & Surgery of the Retina and Vitreous [facility]  I have reviewed the above documentation for accuracy and completeness, and I agree with the above. Gardiner Sleeper, M.D., Ph.D. 11/21/20 12:28 PM   Abbreviations: M myopia (nearsighted); A astigmatism; H hyperopia (farsighted); P presbyopia; Mrx spectacle prescription;  CTL contact lenses; OD right eye; OS left eye; OU both eyes  XT exotropia;  ET esotropia; PEK punctate epithelial keratitis; PEE punctate epithelial erosions; DES dry eye syndrome; MGD meibomian gland dysfunction; ATs artificial tears; PFAT's preservative free artificial tears; [place] nuclear sclerotic cataract; PSC posterior subcapsular cataract; ERM epi-retinal membrane; PVD posterior vitreous detachment; RD retinal detachment; DM diabetes mellitus; DR diabetic retinopathy; NPDR non-proliferative diabetic retinopathy; PDR proliferative diabetic retinopathy; CSME clinically significant macular edema; DME diabetic macular edema; dbh dot blot hemorrhages; CWS cotton wool spot; POAG primary open angle glaucoma; C/D cup-to-disc ratio; HVF humphrey visual field; GVF goldmann visual field; OCT optical coherence tomography; IOP intraocular pressure; BRVO Branch retinal vein occlusion; CRVO central retinal vein occlusion; CRAO central retinal artery occlusion; BRAO branch retinal artery occlusion; RT retinal tear; SB scleral buckle; PPV pars plana vitrectomy; VH Vitreous hemorrhage; PRP panretinal laser photocoagulation; IVK intravitreal kenalog; VMT vitreomacular traction; MH Macular hole;  NVD neovascularization of the disc; NVE neovascularization elsewhere; AREDS age related eye disease study; ARMD age related macular degeneration; POAG primary open angle glaucoma; EBMD epithelial/anterior basement membrane dystrophy; ACIOL anterior chamber intraocular lens; IOL intraocular lens; PCIOL posterior chamber intraocular lens; Phaco/IOL phacoemulsification with intraocular lens placement; [place] photorefractive keratectomy; LASIK laser assisted in situ keratomileusis; HTN hypertension; DM diabetes mellitus; COPD chronic obstructive pulmonary disease

## 2020-11-20 DIAGNOSIS — H353231 Exudative age-related macular degeneration, bilateral, with active choroidal neovascularization: Secondary | ICD-10-CM | POA: Diagnosis not present

## 2020-11-20 DIAGNOSIS — H401134 Primary open-angle glaucoma, bilateral, indeterminate stage: Secondary | ICD-10-CM | POA: Diagnosis not present

## 2020-11-20 DIAGNOSIS — H31411 Hemorrhagic choroidal detachment, right eye: Secondary | ICD-10-CM | POA: Diagnosis not present

## 2020-11-21 ENCOUNTER — Ambulatory Visit (INDEPENDENT_AMBULATORY_CARE_PROVIDER_SITE_OTHER): Payer: Medicare HMO | Admitting: Ophthalmology

## 2020-11-21 ENCOUNTER — Other Ambulatory Visit: Payer: Self-pay

## 2020-11-21 ENCOUNTER — Encounter (INDEPENDENT_AMBULATORY_CARE_PROVIDER_SITE_OTHER): Payer: Self-pay | Admitting: Ophthalmology

## 2020-11-21 DIAGNOSIS — H31411 Hemorrhagic choroidal detachment, right eye: Secondary | ICD-10-CM

## 2020-11-21 DIAGNOSIS — H3581 Retinal edema: Secondary | ICD-10-CM

## 2020-11-21 DIAGNOSIS — H3321 Serous retinal detachment, right eye: Secondary | ICD-10-CM | POA: Diagnosis not present

## 2020-11-21 DIAGNOSIS — Z961 Presence of intraocular lens: Secondary | ICD-10-CM | POA: Diagnosis not present

## 2020-11-21 DIAGNOSIS — H4311 Vitreous hemorrhage, right eye: Secondary | ICD-10-CM

## 2020-11-21 DIAGNOSIS — I10 Essential (primary) hypertension: Secondary | ICD-10-CM

## 2020-11-21 DIAGNOSIS — H35033 Hypertensive retinopathy, bilateral: Secondary | ICD-10-CM

## 2020-11-21 DIAGNOSIS — H353134 Nonexudative age-related macular degeneration, bilateral, advanced atrophic with subfoveal involvement: Secondary | ICD-10-CM

## 2020-11-24 DIAGNOSIS — H31411 Hemorrhagic choroidal detachment, right eye: Secondary | ICD-10-CM | POA: Diagnosis not present

## 2020-11-24 DIAGNOSIS — H401134 Primary open-angle glaucoma, bilateral, indeterminate stage: Secondary | ICD-10-CM | POA: Diagnosis not present

## 2020-11-24 DIAGNOSIS — H4311 Vitreous hemorrhage, right eye: Secondary | ICD-10-CM | POA: Diagnosis not present

## 2020-11-25 ENCOUNTER — Other Ambulatory Visit: Payer: Self-pay

## 2020-11-25 ENCOUNTER — Encounter (INDEPENDENT_AMBULATORY_CARE_PROVIDER_SITE_OTHER): Payer: Medicare HMO | Admitting: Ophthalmology

## 2020-11-25 DIAGNOSIS — H43812 Vitreous degeneration, left eye: Secondary | ICD-10-CM | POA: Diagnosis not present

## 2020-11-25 DIAGNOSIS — H35032 Hypertensive retinopathy, left eye: Secondary | ICD-10-CM

## 2020-11-25 DIAGNOSIS — H353221 Exudative age-related macular degeneration, left eye, with active choroidal neovascularization: Secondary | ICD-10-CM | POA: Diagnosis not present

## 2020-11-25 DIAGNOSIS — H4311 Vitreous hemorrhage, right eye: Secondary | ICD-10-CM | POA: Diagnosis not present

## 2020-11-25 DIAGNOSIS — I10 Essential (primary) hypertension: Secondary | ICD-10-CM

## 2020-12-02 ENCOUNTER — Encounter (INDEPENDENT_AMBULATORY_CARE_PROVIDER_SITE_OTHER): Payer: Medicare HMO | Admitting: Ophthalmology

## 2020-12-02 ENCOUNTER — Other Ambulatory Visit: Payer: Self-pay

## 2020-12-02 DIAGNOSIS — I10 Essential (primary) hypertension: Secondary | ICD-10-CM

## 2020-12-02 DIAGNOSIS — H43812 Vitreous degeneration, left eye: Secondary | ICD-10-CM | POA: Diagnosis not present

## 2020-12-02 DIAGNOSIS — H35032 Hypertensive retinopathy, left eye: Secondary | ICD-10-CM

## 2020-12-02 DIAGNOSIS — H4311 Vitreous hemorrhage, right eye: Secondary | ICD-10-CM

## 2020-12-02 DIAGNOSIS — H353221 Exudative age-related macular degeneration, left eye, with active choroidal neovascularization: Secondary | ICD-10-CM

## 2020-12-03 DIAGNOSIS — H4311 Vitreous hemorrhage, right eye: Secondary | ICD-10-CM | POA: Diagnosis not present

## 2020-12-03 DIAGNOSIS — H401134 Primary open-angle glaucoma, bilateral, indeterminate stage: Secondary | ICD-10-CM | POA: Diagnosis not present

## 2020-12-03 DIAGNOSIS — H31411 Hemorrhagic choroidal detachment, right eye: Secondary | ICD-10-CM | POA: Diagnosis not present

## 2020-12-04 ENCOUNTER — Other Ambulatory Visit (INDEPENDENT_AMBULATORY_CARE_PROVIDER_SITE_OTHER): Payer: Self-pay

## 2020-12-04 MED ORDER — CYCLOPENTOLATE HCL 1 % OP SOLN: 1.0000 [drp] | Freq: Two times a day (BID) | OPHTHALMIC | 0 refills | Status: DC

## 2020-12-09 ENCOUNTER — Encounter (INDEPENDENT_AMBULATORY_CARE_PROVIDER_SITE_OTHER): Payer: Medicare HMO | Admitting: Ophthalmology

## 2020-12-09 ENCOUNTER — Other Ambulatory Visit: Payer: Self-pay

## 2020-12-09 DIAGNOSIS — I10 Essential (primary) hypertension: Secondary | ICD-10-CM | POA: Diagnosis not present

## 2020-12-09 DIAGNOSIS — H35033 Hypertensive retinopathy, bilateral: Secondary | ICD-10-CM | POA: Diagnosis not present

## 2020-12-09 DIAGNOSIS — H4311 Vitreous hemorrhage, right eye: Secondary | ICD-10-CM

## 2020-12-09 DIAGNOSIS — H43813 Vitreous degeneration, bilateral: Secondary | ICD-10-CM

## 2020-12-09 DIAGNOSIS — H353231 Exudative age-related macular degeneration, bilateral, with active choroidal neovascularization: Secondary | ICD-10-CM

## 2020-12-21 ENCOUNTER — Other Ambulatory Visit (INDEPENDENT_AMBULATORY_CARE_PROVIDER_SITE_OTHER): Payer: Self-pay | Admitting: Ophthalmology

## 2020-12-25 ENCOUNTER — Other Ambulatory Visit: Payer: Self-pay

## 2020-12-25 ENCOUNTER — Encounter (INDEPENDENT_AMBULATORY_CARE_PROVIDER_SITE_OTHER): Payer: Medicare HMO | Admitting: Ophthalmology

## 2020-12-25 DIAGNOSIS — H353231 Exudative age-related macular degeneration, bilateral, with active choroidal neovascularization: Secondary | ICD-10-CM

## 2020-12-25 DIAGNOSIS — H43813 Vitreous degeneration, bilateral: Secondary | ICD-10-CM

## 2020-12-25 DIAGNOSIS — H35033 Hypertensive retinopathy, bilateral: Secondary | ICD-10-CM | POA: Diagnosis not present

## 2020-12-25 DIAGNOSIS — I10 Essential (primary) hypertension: Secondary | ICD-10-CM

## 2020-12-25 DIAGNOSIS — H4311 Vitreous hemorrhage, right eye: Secondary | ICD-10-CM | POA: Diagnosis not present

## 2020-12-31 DIAGNOSIS — H4311 Vitreous hemorrhage, right eye: Secondary | ICD-10-CM | POA: Diagnosis not present

## 2020-12-31 DIAGNOSIS — H31411 Hemorrhagic choroidal detachment, right eye: Secondary | ICD-10-CM | POA: Diagnosis not present

## 2020-12-31 DIAGNOSIS — H401134 Primary open-angle glaucoma, bilateral, indeterminate stage: Secondary | ICD-10-CM | POA: Diagnosis not present

## 2020-12-31 DIAGNOSIS — H353231 Exudative age-related macular degeneration, bilateral, with active choroidal neovascularization: Secondary | ICD-10-CM | POA: Diagnosis not present

## 2021-01-08 ENCOUNTER — Other Ambulatory Visit: Payer: Self-pay

## 2021-01-08 ENCOUNTER — Encounter (INDEPENDENT_AMBULATORY_CARE_PROVIDER_SITE_OTHER): Payer: Medicare HMO | Admitting: Ophthalmology

## 2021-01-08 DIAGNOSIS — H35032 Hypertensive retinopathy, left eye: Secondary | ICD-10-CM

## 2021-01-08 DIAGNOSIS — H4311 Vitreous hemorrhage, right eye: Secondary | ICD-10-CM | POA: Diagnosis not present

## 2021-01-08 DIAGNOSIS — H353221 Exudative age-related macular degeneration, left eye, with active choroidal neovascularization: Secondary | ICD-10-CM

## 2021-01-08 DIAGNOSIS — I10 Essential (primary) hypertension: Secondary | ICD-10-CM

## 2021-01-08 DIAGNOSIS — H43812 Vitreous degeneration, left eye: Secondary | ICD-10-CM | POA: Diagnosis not present

## 2021-01-11 ENCOUNTER — Other Ambulatory Visit (INDEPENDENT_AMBULATORY_CARE_PROVIDER_SITE_OTHER): Payer: Self-pay | Admitting: Ophthalmology

## 2021-01-27 DIAGNOSIS — D1801 Hemangioma of skin and subcutaneous tissue: Secondary | ICD-10-CM | POA: Diagnosis not present

## 2021-01-27 DIAGNOSIS — Z85820 Personal history of malignant melanoma of skin: Secondary | ICD-10-CM | POA: Diagnosis not present

## 2021-01-27 DIAGNOSIS — L821 Other seborrheic keratosis: Secondary | ICD-10-CM | POA: Diagnosis not present

## 2021-01-27 DIAGNOSIS — L820 Inflamed seborrheic keratosis: Secondary | ICD-10-CM | POA: Diagnosis not present

## 2021-01-27 DIAGNOSIS — L570 Actinic keratosis: Secondary | ICD-10-CM | POA: Diagnosis not present

## 2021-01-30 ENCOUNTER — Other Ambulatory Visit (INDEPENDENT_AMBULATORY_CARE_PROVIDER_SITE_OTHER): Payer: Self-pay | Admitting: Ophthalmology

## 2021-02-05 ENCOUNTER — Encounter (INDEPENDENT_AMBULATORY_CARE_PROVIDER_SITE_OTHER): Payer: Medicare HMO | Admitting: Ophthalmology

## 2021-02-05 ENCOUNTER — Other Ambulatory Visit: Payer: Self-pay

## 2021-02-05 DIAGNOSIS — H43812 Vitreous degeneration, left eye: Secondary | ICD-10-CM | POA: Diagnosis not present

## 2021-02-05 DIAGNOSIS — H35032 Hypertensive retinopathy, left eye: Secondary | ICD-10-CM | POA: Diagnosis not present

## 2021-02-05 DIAGNOSIS — H353231 Exudative age-related macular degeneration, bilateral, with active choroidal neovascularization: Secondary | ICD-10-CM | POA: Diagnosis not present

## 2021-02-05 DIAGNOSIS — H4311 Vitreous hemorrhage, right eye: Secondary | ICD-10-CM | POA: Diagnosis not present

## 2021-02-05 DIAGNOSIS — I10 Essential (primary) hypertension: Secondary | ICD-10-CM | POA: Diagnosis not present

## 2021-02-09 DIAGNOSIS — H31411 Hemorrhagic choroidal detachment, right eye: Secondary | ICD-10-CM | POA: Diagnosis not present

## 2021-02-09 DIAGNOSIS — H4311 Vitreous hemorrhage, right eye: Secondary | ICD-10-CM | POA: Diagnosis not present

## 2021-02-09 DIAGNOSIS — H401131 Primary open-angle glaucoma, bilateral, mild stage: Secondary | ICD-10-CM | POA: Diagnosis not present

## 2021-02-09 DIAGNOSIS — H353231 Exudative age-related macular degeneration, bilateral, with active choroidal neovascularization: Secondary | ICD-10-CM | POA: Diagnosis not present

## 2021-03-05 ENCOUNTER — Encounter (INDEPENDENT_AMBULATORY_CARE_PROVIDER_SITE_OTHER): Payer: Medicare HMO | Admitting: Ophthalmology

## 2021-03-05 ENCOUNTER — Other Ambulatory Visit: Payer: Self-pay

## 2021-03-05 DIAGNOSIS — H35032 Hypertensive retinopathy, left eye: Secondary | ICD-10-CM | POA: Diagnosis not present

## 2021-03-05 DIAGNOSIS — I10 Essential (primary) hypertension: Secondary | ICD-10-CM | POA: Diagnosis not present

## 2021-03-05 DIAGNOSIS — H4311 Vitreous hemorrhage, right eye: Secondary | ICD-10-CM

## 2021-03-05 DIAGNOSIS — H353231 Exudative age-related macular degeneration, bilateral, with active choroidal neovascularization: Secondary | ICD-10-CM | POA: Diagnosis not present

## 2021-03-05 DIAGNOSIS — H43812 Vitreous degeneration, left eye: Secondary | ICD-10-CM | POA: Diagnosis not present

## 2021-04-02 ENCOUNTER — Encounter (INDEPENDENT_AMBULATORY_CARE_PROVIDER_SITE_OTHER): Payer: Medicare HMO | Admitting: Ophthalmology

## 2021-04-02 ENCOUNTER — Other Ambulatory Visit: Payer: Self-pay

## 2021-04-02 DIAGNOSIS — I10 Essential (primary) hypertension: Secondary | ICD-10-CM

## 2021-04-02 DIAGNOSIS — H43812 Vitreous degeneration, left eye: Secondary | ICD-10-CM

## 2021-04-02 DIAGNOSIS — H4311 Vitreous hemorrhage, right eye: Secondary | ICD-10-CM | POA: Diagnosis not present

## 2021-04-02 DIAGNOSIS — H35032 Hypertensive retinopathy, left eye: Secondary | ICD-10-CM

## 2021-04-02 DIAGNOSIS — H353221 Exudative age-related macular degeneration, left eye, with active choroidal neovascularization: Secondary | ICD-10-CM | POA: Diagnosis not present

## 2021-04-10 DIAGNOSIS — Z0000 Encounter for general adult medical examination without abnormal findings: Secondary | ICD-10-CM | POA: Diagnosis not present

## 2021-04-10 DIAGNOSIS — E119 Type 2 diabetes mellitus without complications: Secondary | ICD-10-CM | POA: Diagnosis not present

## 2021-04-10 DIAGNOSIS — E7801 Familial hypercholesterolemia: Secondary | ICD-10-CM | POA: Diagnosis not present

## 2021-04-22 DIAGNOSIS — N1832 Chronic kidney disease, stage 3b: Secondary | ICD-10-CM | POA: Diagnosis not present

## 2021-04-22 DIAGNOSIS — E119 Type 2 diabetes mellitus without complications: Secondary | ICD-10-CM | POA: Diagnosis not present

## 2021-04-22 DIAGNOSIS — I700 Atherosclerosis of aorta: Secondary | ICD-10-CM | POA: Diagnosis not present

## 2021-04-22 DIAGNOSIS — I2510 Atherosclerotic heart disease of native coronary artery without angina pectoris: Secondary | ICD-10-CM | POA: Diagnosis not present

## 2021-04-22 DIAGNOSIS — D61818 Other pancytopenia: Secondary | ICD-10-CM | POA: Diagnosis not present

## 2021-04-22 DIAGNOSIS — E039 Hypothyroidism, unspecified: Secondary | ICD-10-CM | POA: Diagnosis not present

## 2021-04-22 DIAGNOSIS — Z0000 Encounter for general adult medical examination without abnormal findings: Secondary | ICD-10-CM | POA: Diagnosis not present

## 2021-04-22 DIAGNOSIS — N189 Chronic kidney disease, unspecified: Secondary | ICD-10-CM | POA: Diagnosis not present

## 2021-04-22 DIAGNOSIS — I10 Essential (primary) hypertension: Secondary | ICD-10-CM | POA: Diagnosis not present

## 2021-04-22 DIAGNOSIS — E7800 Pure hypercholesterolemia, unspecified: Secondary | ICD-10-CM | POA: Diagnosis not present

## 2021-04-30 ENCOUNTER — Encounter (INDEPENDENT_AMBULATORY_CARE_PROVIDER_SITE_OTHER): Payer: Medicare HMO | Admitting: Ophthalmology

## 2021-04-30 ENCOUNTER — Other Ambulatory Visit: Payer: Self-pay

## 2021-04-30 DIAGNOSIS — H35033 Hypertensive retinopathy, bilateral: Secondary | ICD-10-CM | POA: Diagnosis not present

## 2021-04-30 DIAGNOSIS — H353221 Exudative age-related macular degeneration, left eye, with active choroidal neovascularization: Secondary | ICD-10-CM

## 2021-04-30 DIAGNOSIS — H4311 Vitreous hemorrhage, right eye: Secondary | ICD-10-CM

## 2021-04-30 DIAGNOSIS — I10 Essential (primary) hypertension: Secondary | ICD-10-CM | POA: Diagnosis not present

## 2021-04-30 DIAGNOSIS — H43812 Vitreous degeneration, left eye: Secondary | ICD-10-CM | POA: Diagnosis not present

## 2021-05-20 DIAGNOSIS — E039 Hypothyroidism, unspecified: Secondary | ICD-10-CM | POA: Diagnosis not present

## 2021-05-20 DIAGNOSIS — D61818 Other pancytopenia: Secondary | ICD-10-CM | POA: Diagnosis not present

## 2021-05-20 DIAGNOSIS — N189 Chronic kidney disease, unspecified: Secondary | ICD-10-CM | POA: Diagnosis not present

## 2021-05-24 DIAGNOSIS — H547 Unspecified visual loss: Secondary | ICD-10-CM | POA: Diagnosis not present

## 2021-05-24 DIAGNOSIS — I10 Essential (primary) hypertension: Secondary | ICD-10-CM | POA: Diagnosis not present

## 2021-05-24 DIAGNOSIS — E119 Type 2 diabetes mellitus without complications: Secondary | ICD-10-CM | POA: Diagnosis not present

## 2021-05-24 DIAGNOSIS — M810 Age-related osteoporosis without current pathological fracture: Secondary | ICD-10-CM | POA: Diagnosis not present

## 2021-05-24 DIAGNOSIS — M109 Gout, unspecified: Secondary | ICD-10-CM | POA: Diagnosis not present

## 2021-05-24 DIAGNOSIS — Z7982 Long term (current) use of aspirin: Secondary | ICD-10-CM | POA: Diagnosis not present

## 2021-05-24 DIAGNOSIS — E785 Hyperlipidemia, unspecified: Secondary | ICD-10-CM | POA: Diagnosis not present

## 2021-05-24 DIAGNOSIS — Z7984 Long term (current) use of oral hypoglycemic drugs: Secondary | ICD-10-CM | POA: Diagnosis not present

## 2021-05-24 DIAGNOSIS — H409 Unspecified glaucoma: Secondary | ICD-10-CM | POA: Diagnosis not present

## 2021-05-24 DIAGNOSIS — E039 Hypothyroidism, unspecified: Secondary | ICD-10-CM | POA: Diagnosis not present

## 2021-05-24 DIAGNOSIS — Z85820 Personal history of malignant melanoma of skin: Secondary | ICD-10-CM | POA: Diagnosis not present

## 2021-05-24 DIAGNOSIS — Z8249 Family history of ischemic heart disease and other diseases of the circulatory system: Secondary | ICD-10-CM | POA: Diagnosis not present

## 2021-05-27 DIAGNOSIS — E119 Type 2 diabetes mellitus without complications: Secondary | ICD-10-CM | POA: Diagnosis not present

## 2021-05-27 DIAGNOSIS — H6123 Impacted cerumen, bilateral: Secondary | ICD-10-CM | POA: Diagnosis not present

## 2021-05-27 DIAGNOSIS — E7800 Pure hypercholesterolemia, unspecified: Secondary | ICD-10-CM | POA: Diagnosis not present

## 2021-05-27 DIAGNOSIS — E039 Hypothyroidism, unspecified: Secondary | ICD-10-CM | POA: Diagnosis not present

## 2021-05-27 DIAGNOSIS — Z23 Encounter for immunization: Secondary | ICD-10-CM | POA: Diagnosis not present

## 2021-05-27 DIAGNOSIS — M79622 Pain in left upper arm: Secondary | ICD-10-CM | POA: Diagnosis not present

## 2021-05-27 DIAGNOSIS — R079 Chest pain, unspecified: Secondary | ICD-10-CM | POA: Diagnosis not present

## 2021-05-27 DIAGNOSIS — I2510 Atherosclerotic heart disease of native coronary artery without angina pectoris: Secondary | ICD-10-CM | POA: Diagnosis not present

## 2021-05-27 DIAGNOSIS — D61818 Other pancytopenia: Secondary | ICD-10-CM | POA: Diagnosis not present

## 2021-05-27 DIAGNOSIS — E11319 Type 2 diabetes mellitus with unspecified diabetic retinopathy without macular edema: Secondary | ICD-10-CM | POA: Diagnosis not present

## 2021-05-28 ENCOUNTER — Encounter (INDEPENDENT_AMBULATORY_CARE_PROVIDER_SITE_OTHER): Payer: Medicare HMO | Admitting: Ophthalmology

## 2021-05-28 ENCOUNTER — Other Ambulatory Visit: Payer: Self-pay

## 2021-05-28 DIAGNOSIS — H353231 Exudative age-related macular degeneration, bilateral, with active choroidal neovascularization: Secondary | ICD-10-CM

## 2021-05-28 DIAGNOSIS — H35033 Hypertensive retinopathy, bilateral: Secondary | ICD-10-CM | POA: Diagnosis not present

## 2021-05-28 DIAGNOSIS — I10 Essential (primary) hypertension: Secondary | ICD-10-CM | POA: Diagnosis not present

## 2021-05-28 DIAGNOSIS — H43813 Vitreous degeneration, bilateral: Secondary | ICD-10-CM

## 2021-05-28 DIAGNOSIS — H4311 Vitreous hemorrhage, right eye: Secondary | ICD-10-CM | POA: Diagnosis not present

## 2021-06-13 DIAGNOSIS — R051 Acute cough: Secondary | ICD-10-CM | POA: Diagnosis not present

## 2021-06-13 DIAGNOSIS — J069 Acute upper respiratory infection, unspecified: Secondary | ICD-10-CM | POA: Diagnosis not present

## 2021-06-13 DIAGNOSIS — R509 Fever, unspecified: Secondary | ICD-10-CM | POA: Diagnosis not present

## 2021-06-13 DIAGNOSIS — Z20828 Contact with and (suspected) exposure to other viral communicable diseases: Secondary | ICD-10-CM | POA: Diagnosis not present

## 2021-06-13 DIAGNOSIS — N3091 Cystitis, unspecified with hematuria: Secondary | ICD-10-CM | POA: Diagnosis not present

## 2021-06-13 DIAGNOSIS — R300 Dysuria: Secondary | ICD-10-CM | POA: Diagnosis not present

## 2021-06-15 DIAGNOSIS — H401131 Primary open-angle glaucoma, bilateral, mild stage: Secondary | ICD-10-CM | POA: Diagnosis not present

## 2021-06-15 DIAGNOSIS — H4311 Vitreous hemorrhage, right eye: Secondary | ICD-10-CM | POA: Diagnosis not present

## 2021-06-15 DIAGNOSIS — H31411 Hemorrhagic choroidal detachment, right eye: Secondary | ICD-10-CM | POA: Diagnosis not present

## 2021-06-15 DIAGNOSIS — H353231 Exudative age-related macular degeneration, bilateral, with active choroidal neovascularization: Secondary | ICD-10-CM | POA: Diagnosis not present

## 2021-06-25 ENCOUNTER — Encounter (INDEPENDENT_AMBULATORY_CARE_PROVIDER_SITE_OTHER): Payer: Medicare HMO | Admitting: Ophthalmology

## 2021-06-25 ENCOUNTER — Other Ambulatory Visit: Payer: Self-pay

## 2021-06-25 DIAGNOSIS — H4311 Vitreous hemorrhage, right eye: Secondary | ICD-10-CM

## 2021-06-25 DIAGNOSIS — I10 Essential (primary) hypertension: Secondary | ICD-10-CM

## 2021-06-25 DIAGNOSIS — H43812 Vitreous degeneration, left eye: Secondary | ICD-10-CM

## 2021-06-25 DIAGNOSIS — H35033 Hypertensive retinopathy, bilateral: Secondary | ICD-10-CM

## 2021-06-25 DIAGNOSIS — H353231 Exudative age-related macular degeneration, bilateral, with active choroidal neovascularization: Secondary | ICD-10-CM

## 2021-07-01 NOTE — Progress Notes (Signed)
?Cardiology Office Note:   ? ?Date:  07/02/2021  ? ?ID:  Patricia Moran, DOB 02-22-1930, MRN [id number] ? ?PCP:  Holland Commons, FNP ?[place] Cardiologist: Kirk Ruths, MD  ? ?Reason for visit: Chest pain ? ?History of Present Illness:   ? ?Patricia Moran is a 88 y.o. female with a hx of CAD, mitral regurgitation, aortic stenosis.  LHC 06/30/11: LAD diffuse 40-50%, circumflex irregular with less than 10% stenosis, mid RCA 50-60%, EF 55-65%. ? ?She last saw Dr. Stanford Breed in May 2022.  She was doing well with no chest pain. ? ?Today, she comes in with her daughter.  She mentions that after losing some of her eyesight, her memory is not as good.  Her daughter helps fill in that over the last 1 to 2 months, she has had 4 episodes of left sided chest pain.  She does not remember what she was doing at the time.  She chews aspirin 325 and the chest pain dissipates.  Episodes last less than 15 minutes.  No associated shortness of breath, lightheadedness, diaphoresis or nausea.  They also mention she has had episodes of left upper arm pain.  She has had 1 episode of feeling lightheaded but wonders if this was late related to low blood sugar.  She is a diabetic.  She denies heart failure symptoms including PND orthopnea and lower extremity edema. ? ?She lives independently.  She washes her dishes and does her laundry without chest pain. ? ?  ?Past Medical History:  ?Diagnosis Date  ? Anemia   ? Felt secondary renal insuffiency, seen by Dr. Ralene Ok - Workup in the past has included normal iron, folate, B12, serum and urine protein electrophoresis as well as an ANA. Peripheral blood smear had been normal  ? Carotid arterial disease ([place])   ? Last dopplers 12/1476 - RICA 29-56%, LICA 2-13% for recheck in 1 year  ? Coronary artery disease   ? Moderate nonobstructive disease by cath 06/2011 (ruled out for MI/PE at that time)  ? Dyslipidemia   ? Esophageal reflux   ? Gout   ? Hypertensive retinopathy   ? Macular  degeneration (senile) of retina, unspecified   ? Osteoporosis   ? Other and unspecified hyperlipidemia   ? Pancytopenia 04/19/2008  ? Intermittent mild leukopenia and thrombocytopenia felt to be most likely due to immune dysregulation per Dr. Ralene Ok  ? Type II or unspecified type diabetes mellitus without mention of complication, not stated as uncontrolled   ? Unspecified essential hypertension   ? ? ?Past Surgical History:  ?Procedure Laterality Date  ? abdominal cyst    ? ABDOMINAL HYSTERECTOMY    ? cyst removal  ? APPENDECTOMY    ? age 4  ? CATARACT EXTRACTION    ? EYE SURGERY    ? LEFT HEART CATHETERIZATION WITH CORONARY ANGIOGRAM N/A 06/30/2011  ? Procedure: LEFT HEART CATHETERIZATION WITH CORONARY ANGIOGRAM;  Surgeon: Peter M Martinique, MD;  Location: [facility] CATH LAB;  Service: Cardiovascular;  Laterality: N/A;  ? ? ?Current Medications: ?Current Meds  ?Medication Sig  ? acetaZOLAMIDE ER (DIAMOX) 500 MG capsule Take 500 mg by mouth every 12 (twelve) hours.  ? allopurinol (ZYLOPRIM) 100 MG tablet Take 50 mg by mouth daily.   ? aspirin 81 MG tablet Take 81 mg by mouth daily.    ? Bevacizumab (AVASTIN IV) Inject into the vein. Injection in left eye. Every 4 weeks.  ? bisoprolol (ZEBETA) 5 MG tablet TAKE 1/2 TABLET BY  MOUTH TWICE DAILY FOR 30 DAYS  ? Blood Glucose Monitoring Suppl (ONE TOUCH ULTRA 2) w/Device KIT USE AS DIRECTED BY PHYSICIAN FOR GLUCOSE MONITORING  ? brimonidine (ALPHAGAN) 0.2 % ophthalmic solution 1 drop into affected eye  ? Calcium Carbonate (CALTRATE 600 PO) Take 100 mg by mouth at bedtime.   ? cyclopentolate (CYCLODRYL,CYCLOGYL) 1 % ophthalmic solution Place 1 drop into the right eye 2 (two) times daily.  ? diazepam (VALIUM) 5 MG tablet Take 5 mg by mouth as needed (as needed for eye injection).   ? dorzolamide (TRUSOPT) 2 % ophthalmic solution 1 drop into affected eye  ? levothyroxine (SYNTHROID) 50 MCG tablet 1 tablet in the morning on an empty stomach  ? lisinopril (ZESTRIL) 20 MG tablet Take 1  tablet (20 mg total) by mouth daily.  ? methocarbamol (ROBAXIN) 500 MG tablet Take 1 tablet (500 mg total) by mouth 2 (two) times daily.  ? moxifloxacin (VIGAMOX) 0.5 % ophthalmic solution Place 1 drop into both eyes as needed. Takes with injection in the eye  ? Multiple Vitamins-Minerals (OCUVITE PRESERVISION) TABS Take 1 tablet by mouth 2 (two) times a day.   ? Pediatric Multiple Vitamins (FLINSTONES GUMMIES OMEGA-3 DHA) CHEW Chew by mouth.  ? pioglitazone (ACTOS) 15 MG tablet Take 2 tablets (30 mg total) by mouth daily. SEE COMMENT (Patient taking differently: Take 15 mg by mouth daily.)  ? Polyethyl Glycol-Propyl Glycol 0.4-0.3 % SOLN Place 1 drop into the right eye daily.   ? prednisoLONE acetate (PRED FORTE) 1 % ophthalmic suspension SMARTSIG:In Eye(s)  ? predniSONE (DELTASONE) 20 MG tablet SMARTSIG:1 Pill By Mouth Daily  ? simvastatin (ZOCOR) 40 MG tablet take 1 tablet by mouth in the evening once a day  ? [DISCONTINUED] lisinopril (ZESTRIL) 40 MG tablet Take 40 mg by mouth daily.  ?  ? ?Allergies:   Hydrocodone, Nitroglycerin, Nitroglycerin er, Ofloxacin, and Sulfonamide derivatives  ? ?Social History  ? ?Socioeconomic History  ? Marital status: Widowed  ?  Spouse name: Not on file  ? Number of children: Not on file  ? Years of education: Not on file  ? Highest education level: Not on file  ?Occupational History  ? Not on file  ?Tobacco Use  ? Smoking status: Never  ? Smokeless tobacco: Never  ?Vaping Use  ? Vaping Use: Never used  ?Substance and Sexual Activity  ? Alcohol use: No  ? Drug use: No  ? Sexual activity: Never  ?  Birth control/protection: Post-menopausal  ?Other Topics Concern  ? Not on file  ?Social History Narrative  ? 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   ? ?Social Determinants of Health  ? ?Financial Resource Strain: Not on file  ?Food Insecurity: Not on file  ?Transportation Needs: Not on file  ?Physical Activity: Not on file  ?Stress: Not on file  ?Social  Connections: Not on file  ?  ? ?Family History: ?The patient's family history includes Cancer in her maternal aunt; Coronary artery disease in an other family member; Heart attack in her father. ? ?ROS:   ?Please see the history of present illness.    ? ?EKGs/Labs/Other Studies Reviewed:   ? ?EKG:  The ekg ordered today demonstrates sinus bradycardia with PAC, right bundle branch block, heart rate 56, unchanged from November 2021. ? ?Recent Labs: ?09/02/2020: ALT 13  ? ?Recent Lipid Panel ?Lab Results  ?Component Value Date/Time  ? CHOL 140 09/02/2020 12:00 AM  ? TRIG 124 09/02/2020 12:00  AM  ? HDL 50 09/02/2020 12:00 AM  ? LDLCALC 68 09/02/2020 12:00 AM  ? ? ?Physical Exam:   ? ?VS:  BP 136/64   Pulse (!) 56   Ht '4\' 11"'  (1.499 m)   Wt 119 lb 9.6 oz (54.3 kg)   SpO2 98%   BMI 24.16 kg/m?    ?No data found. ? ?Wt Readings from Last 3 Encounters:  ?07/02/21 119 lb 9.6 oz (54.3 kg)  ?09/12/20 121 lb 3.2 oz (55 kg)  ?03/18/20 127 lb (57.6 kg)  ?  ? ?GEN:  Well nourished, well developed in no acute distress, elderly ?HEENT: Normal ?NECK: No JVD; No carotid bruits ?CARDIAC: RRR, systolic murmur ?RESPIRATORY:  Clear to auscultation without rales, wheezing or rhonchi  ?ABDOMEN: Soft, non-tender, non-distended ?MUSCULOSKELETAL: No edema; No deformity  ?SKIN: Warm and dry ?NEUROLOGIC:  Alert and oriented ?PSYCHIATRIC:  Normal affect  ? ?  ?ASSESSMENT AND PLAN  ? ?Precordial pain ?-No ischemic changes on EKG ?-Unknown if exertional ?-Improved with aspirin 378m ?-Reported history of syncope with nitroglycerin. ?-Add amlodipine for possible angina ?-Recheck 2D echo.  If EF is normal and valve disease is stable, prefer conservative management given her age. ? ?Coronary artery disease ?-LHC 06/30/11: LAD diffuse 40-50%, circumflex irregular with less than 10% stenosis, mid RCA 50-60%, EF 55-65%. ?-Continue aspirin and statin therapy. ?-She had amlodipine on her med list but she has not been on this recently per her daughter.   We will restart amlodipine 2.5 mg daily to maximize her antianginal regimen.  Will defer on Imdur with her history of syncope with nitroglycerin.   ?-Continue beta-blocker. ? ?Valvular disease with aortic stenosis a

## 2021-07-02 ENCOUNTER — Other Ambulatory Visit: Payer: Self-pay

## 2021-07-02 ENCOUNTER — Ambulatory Visit: Payer: Medicare HMO | Admitting: Physician Assistant

## 2021-07-02 ENCOUNTER — Encounter: Payer: Self-pay | Admitting: Physician Assistant

## 2021-07-02 VITALS — BP 136/64 | HR 56 | Ht 59.0 in | Wt 119.6 lb

## 2021-07-02 DIAGNOSIS — R072 Precordial pain: Secondary | ICD-10-CM

## 2021-07-02 DIAGNOSIS — I10 Essential (primary) hypertension: Secondary | ICD-10-CM

## 2021-07-02 DIAGNOSIS — E7800 Pure hypercholesterolemia, unspecified: Secondary | ICD-10-CM | POA: Diagnosis not present

## 2021-07-02 DIAGNOSIS — I2510 Atherosclerotic heart disease of native coronary artery without angina pectoris: Secondary | ICD-10-CM

## 2021-07-02 MED ORDER — AMLODIPINE BESYLATE 2.5 MG PO TABS: 2.5000 mg | ORAL_TABLET | Freq: Every day | ORAL | 3 refills | Status: DC

## 2021-07-02 MED ORDER — LISINOPRIL 20 MG PO TABS: 20.0000 mg | ORAL_TABLET | Freq: Every day | ORAL | 3 refills | Status: DC

## 2021-07-02 NOTE — Patient Instructions (Signed)
Medication Instructions:  ?Restart Amlodipine 2.5 mg ( Take 1 Tablet Daily In The Morning). ?Decrease Lisinopril to 20 mg Daily( Take 1 Tablet in The Evening). ?*If you need a refill on your cardiac medications before your next appointment, please call your pharmacy* ? ? ?Lab Work: ?No labs ?If you have labs (blood work) drawn today and your tests are completely normal, you will receive your results only by: ?MyChart Message (if you have MyChart) OR ?A paper copy in the mail ?If you have any lab test that is abnormal or we need to change your treatment, we will call you to review the results. ? ? ?Testing/Procedures: [address], [address]. ?Your physician has requested that you have an echocardiogram. Echocardiography is a painless test that uses sound waves to create images of your heart. It provides your doctor with information about the size and shape of your heart and how well your heart?s chambers and valves are working. This procedure takes approximately one hour. There are no restrictions for this procedure.  ? ? ?Follow-Up: ?At [facility], you and your health needs are our priority.  As part of our continuing mission to provide you with exceptional heart care, we have created designated Provider Care Teams.  These Care Teams include your primary Cardiologist (physician) and Advanced Practice Providers (APPs -  Physician Assistants and Nurse Practitioners) who all work together to provide you with the care you need, when you need it. ? ?We recommend signing up for the patient portal called "MyChart".  Sign up information is provided on this After Visit Summary.  MyChart is used to connect with patients for Virtual Visits (Telemedicine).  Patients are able to view lab/test results, encounter notes, upcoming appointments, etc.  Non-urgent messages can be sent to your provider as well.   ?To learn more about what you can do with MyChart, go to [email or web address].   ? ?Your next  appointment:   ?4-6 week(s) ? ?The format for your next appointment:   ?In Person ? ?Provider:   ?Caron Presume, PA-C    Then, Kirk Ruths, MD will plan to see you again in 4 month(s).  ? ? ?  ?

## 2021-07-07 ENCOUNTER — Ambulatory Visit: Payer: Medicare HMO | Admitting: Nurse Practitioner

## 2021-07-09 DIAGNOSIS — Z85820 Personal history of malignant melanoma of skin: Secondary | ICD-10-CM | POA: Diagnosis not present

## 2021-07-09 DIAGNOSIS — L300 Nummular dermatitis: Secondary | ICD-10-CM | POA: Diagnosis not present

## 2021-07-17 ENCOUNTER — Ambulatory Visit (HOSPITAL_COMMUNITY): Payer: Medicare HMO | Attending: Cardiology

## 2021-07-17 DIAGNOSIS — E7800 Pure hypercholesterolemia, unspecified: Secondary | ICD-10-CM | POA: Insufficient documentation

## 2021-07-17 DIAGNOSIS — I350 Nonrheumatic aortic (valve) stenosis: Secondary | ICD-10-CM | POA: Diagnosis not present

## 2021-07-17 DIAGNOSIS — I10 Essential (primary) hypertension: Secondary | ICD-10-CM | POA: Insufficient documentation

## 2021-07-17 DIAGNOSIS — I2510 Atherosclerotic heart disease of native coronary artery without angina pectoris: Secondary | ICD-10-CM | POA: Diagnosis not present

## 2021-07-17 DIAGNOSIS — R072 Precordial pain: Secondary | ICD-10-CM | POA: Insufficient documentation

## 2021-07-17 LAB — ECHOCARDIOGRAM COMPLETE
AR max vel: 1.04 cm2
AV Area VTI: 0.98 cm2
AV Area mean vel: 0.98 cm2
AV Mean grad: 11.7 mmHg
AV Peak grad: 20.6 mmHg
Ao pk vel: 2.27 m/s
Area-P 1/2: 3 cm2
MV VTI: 1.37 cm2
S' Lateral: 2.9 cm

## 2021-07-21 ENCOUNTER — Telehealth: Payer: Self-pay | Admitting: Physician Assistant

## 2021-07-21 NOTE — Telephone Encounter (Signed)
Received 2D echo results.  Heart squeeze remains normal with ejection fraction 55 to 60%.  There is some mild to moderate valve disease but nothing that warrants further work-up at this time.  This is good news. ? ?We will reassess her chest pain at her follow-up later this month.  If chest pain becomes much more frequent or does not resolve, she needs to seek immediate evaluation. ?

## 2021-07-22 ENCOUNTER — Telehealth: Payer: Self-pay

## 2021-07-22 NOTE — Telephone Encounter (Addendum)
Called patient regarding results of echocardiogram. Patient had understanding fo results.----- Message from Warren Lacy, PA-C sent at 07/21/2021  9:46 AM EDT ----- ?Please share patient's echo results with her.  I recorded this in a telephone note since I accidentally exited her echo without commenting on result. ?

## 2021-07-23 ENCOUNTER — Encounter (INDEPENDENT_AMBULATORY_CARE_PROVIDER_SITE_OTHER): Payer: Medicare HMO | Admitting: Ophthalmology

## 2021-07-23 DIAGNOSIS — H35033 Hypertensive retinopathy, bilateral: Secondary | ICD-10-CM | POA: Diagnosis not present

## 2021-07-23 DIAGNOSIS — I10 Essential (primary) hypertension: Secondary | ICD-10-CM

## 2021-07-23 DIAGNOSIS — H43813 Vitreous degeneration, bilateral: Secondary | ICD-10-CM

## 2021-07-23 DIAGNOSIS — H353231 Exudative age-related macular degeneration, bilateral, with active choroidal neovascularization: Secondary | ICD-10-CM

## 2021-07-23 DIAGNOSIS — H4311 Vitreous hemorrhage, right eye: Secondary | ICD-10-CM

## 2021-08-11 ENCOUNTER — Telehealth: Payer: Self-pay

## 2021-08-11 NOTE — Telephone Encounter (Signed)
Rescheduled patient with Dr. Stanford Breed for 08/13/2021 with Dr. Stanford Breed ?

## 2021-08-11 NOTE — Telephone Encounter (Signed)
Called patient to advise appointment with Melvenia Needles. Rescheduled patient with Dr. Stanford Breed for April 27, 8:40 AM. ?

## 2021-08-12 ENCOUNTER — Ambulatory Visit: Payer: Medicare HMO | Admitting: Physician Assistant

## 2021-08-13 ENCOUNTER — Encounter: Payer: Self-pay | Admitting: Cardiology

## 2021-08-13 ENCOUNTER — Ambulatory Visit: Payer: Medicare HMO | Admitting: Cardiology

## 2021-08-13 VITALS — BP 140/62 | HR 62 | Ht 60.0 in | Wt 122.6 lb

## 2021-08-13 DIAGNOSIS — I2510 Atherosclerotic heart disease of native coronary artery without angina pectoris: Secondary | ICD-10-CM | POA: Diagnosis not present

## 2021-08-13 DIAGNOSIS — R072 Precordial pain: Secondary | ICD-10-CM

## 2021-08-13 DIAGNOSIS — I350 Nonrheumatic aortic (valve) stenosis: Secondary | ICD-10-CM

## 2021-08-13 DIAGNOSIS — I10 Essential (primary) hypertension: Secondary | ICD-10-CM | POA: Diagnosis not present

## 2021-08-13 DIAGNOSIS — E7800 Pure hypercholesterolemia, unspecified: Secondary | ICD-10-CM | POA: Diagnosis not present

## 2021-08-13 NOTE — Patient Instructions (Signed)
?  Follow-Up: At [facility], you and your health needs are our priority.  As part of our continuing mission to provide you with exceptional heart care, we have created designated Provider Care Teams.  These Care Teams include your primary Cardiologist (physician) and Advanced Practice Providers (APPs -  Physician Assistants and Nurse Practitioners) who all work together to provide you with the care you need, when you need it.  We recommend signing up for the patient portal called "MyChart".  Sign up information is provided on this After Visit Summary.  MyChart is used to connect with patients for Virtual Visits (Telemedicine).  Patients are able to view lab/test results, encounter notes, upcoming appointments, etc.  Non-urgent messages can be sent to your provider as well.   To learn more about what you can do with MyChart, go to [email or web address].    Your next appointment:    As scheduled  Important Information About Sugar       

## 2021-08-13 NOTE — Progress Notes (Signed)
? ? ? ? ?HPI:FU CAD; seen in the past for chest pain and near syncope. CardioNet monitor was performed in November of 2011 related to syncope and revealed sinus with pacs. LHC 06/30/11: LAD diffuse 40-50%, circumflex irregular with less than 10% stenosis, mid RCA 50-60%, EF 55-65%. Medical therapy was recommended. Carotid dopplers January 2016 showed no significant stenosis.  Echocardiogram March 2023 showed normal LV function, grade 2 diastolic dysfunction, moderate left atrial enlargement, mild to moderate mitral regurgitation, mild to moderate aortic stenosis with mean gradient 12 mmHg, aortic valve area calculated at 1 cm? and dimensionless index 0.31; trace aortic insufficiency.  Patient seen recently in the office with chest pain.  Amlodipine added for possible angina.  Since she was last seen, her symptoms have improved.  She has occasional mild chest pain relieved with aspirin.  She has continuous pain in her left arm.  No she does not have exertional chest pain.  No dyspnea on exertion or syncope. ? ?Current Outpatient Medications  ?Medication Sig Dispense Refill  ? acetaZOLAMIDE ER (DIAMOX) 500 MG capsule Take 500 mg by mouth every 12 (twelve) hours.    ? allopurinol (ZYLOPRIM) 100 MG tablet Take 50 mg by mouth daily.     ? amLODipine (NORVASC) 2.5 MG tablet Take 1 tablet (2.5 mg total) by mouth daily. 90 tablet 3  ? aspirin 81 MG tablet Take 81 mg by mouth daily.      ? Bevacizumab (AVASTIN IV) Inject into the vein. Injection in left eye. Every 4 weeks.    ? bisoprolol (ZEBETA) 5 MG tablet TAKE 1/2 TABLET BY MOUTH TWICE DAILY FOR 30 DAYS    ? Blood Glucose Monitoring Suppl (ONE TOUCH ULTRA 2) w/Device KIT USE AS DIRECTED BY PHYSICIAN FOR GLUCOSE MONITORING    ? brimonidine (ALPHAGAN) 0.2 % ophthalmic solution 1 drop into affected eye    ? Calcium Carbonate (CALTRATE 600 PO) Take 100 mg by mouth at bedtime.     ? cyclopentolate (CYCLODRYL,CYCLOGYL) 1 % ophthalmic solution Place 1 drop into the right eye 2  (two) times daily. 2 mL 0  ? diazepam (VALIUM) 5 MG tablet Take 5 mg by mouth as needed (as needed for eye injection).     ? dorzolamide (TRUSOPT) 2 % ophthalmic solution 1 drop into affected eye    ? levothyroxine (SYNTHROID) 50 MCG tablet 1 tablet in the morning on an empty stomach    ? lisinopril (ZESTRIL) 20 MG tablet Take 1 tablet (20 mg total) by mouth daily. 90 tablet 3  ? methocarbamol (ROBAXIN) 500 MG tablet Take 1 tablet (500 mg total) by mouth 2 (two) times daily. 20 tablet 0  ? moxifloxacin (VIGAMOX) 0.5 % ophthalmic solution Place 1 drop into both eyes as needed. Takes with injection in the eye    ? Multiple Vitamins-Minerals (OCUVITE PRESERVISION) TABS Take 1 tablet by mouth 2 (two) times a day.     ? Pediatric Multiple Vitamins (FLINSTONES GUMMIES OMEGA-3 DHA) CHEW Chew by mouth.    ? pioglitazone (ACTOS) 15 MG tablet Take 2 tablets (30 mg total) by mouth daily. SEE COMMENT (Patient taking differently: Take 15 mg by mouth daily.)    ? Polyethyl Glycol-Propyl Glycol 0.4-0.3 % SOLN Place 1 drop into the right eye daily.     ? prednisoLONE acetate (PRED FORTE) 1 % ophthalmic suspension SMARTSIG:In Eye(s)    ? predniSONE (DELTASONE) 20 MG tablet SMARTSIG:1 Pill By Mouth Daily    ? simvastatin (ZOCOR) 40 MG tablet take 1  tablet by mouth in the evening once a day    ? ?No current facility-administered medications for this visit.  ? ? ? ?Past Medical History:  ?Diagnosis Date  ? Anemia   ? Felt secondary renal insuffiency, seen by Dr. Ralene Ok - Workup in the past has included normal iron, folate, B12, serum and urine protein electrophoresis as well as an ANA. Peripheral blood smear had been normal  ? Carotid arterial disease ([place])   ? Last dopplers 0/9470 - RICA 96-28%, LICA 3-66% for recheck in 1 year  ? Coronary artery disease   ? Moderate nonobstructive disease by cath 06/2011 (ruled out for MI/PE at that time)  ? Dyslipidemia   ? Esophageal reflux   ? Gout   ? Hypertensive retinopathy   ? Macular  degeneration (senile) of retina, unspecified   ? Osteoporosis   ? Other and unspecified hyperlipidemia   ? Pancytopenia 04/19/2008  ? Intermittent mild leukopenia and thrombocytopenia felt to be most likely due to immune dysregulation per Dr. Ralene Ok  ? Type II or unspecified type diabetes mellitus without mention of complication, not stated as uncontrolled   ? Unspecified essential hypertension   ? ? ?Past Surgical History:  ?Procedure Laterality Date  ? abdominal cyst    ? ABDOMINAL HYSTERECTOMY    ? cyst removal  ? APPENDECTOMY    ? age 76  ? CATARACT EXTRACTION    ? EYE SURGERY    ? LEFT HEART CATHETERIZATION WITH CORONARY ANGIOGRAM N/A 06/30/2011  ? Procedure: LEFT HEART CATHETERIZATION WITH CORONARY ANGIOGRAM;  Surgeon: Peter M Martinique, MD;  Location: [facility] CATH LAB;  Service: Cardiovascular;  Laterality: N/A;  ? ? ?Social History  ? ?Socioeconomic History  ? Marital status: Widowed  ?  Spouse name: Not on file  ? Number of children: Not on file  ? Years of education: Not on file  ? Highest education level: Not on file  ?Occupational History  ? Not on file  ?Tobacco Use  ? Smoking status: Never  ? Smokeless tobacco: Never  ?Vaping Use  ? Vaping Use: Never used  ?Substance and Sexual Activity  ? Alcohol use: No  ? Drug use: No  ? Sexual activity: Never  ?  Birth control/protection: Post-menopausal  ?Other Topics Concern  ? Not on file  ?Social History Narrative  ? 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   ? ?Social Determinants of Health  ? ?Financial Resource Strain: Not on file  ?Food Insecurity: Not on file  ?Transportation Needs: Not on file  ?Physical Activity: Not on file  ?Stress: Not on file  ?Social Connections: Not on file  ?Intimate Partner Violence: Not on file  ? ? ?Family History  ?Problem Relation Age of Onset  ? Heart attack Father   ?     in his 29's.  ? Coronary artery disease Other   ?     younger siblings  ? Cancer Maternal Aunt   ?     GYN  ? ? ?ROS: no fevers or  chills, productive cough, hemoptysis, dysphasia, odynophagia, melena, hematochezia, dysuria, hematuria, rash, seizure activity, orthopnea, PND, pedal edema, claudication. Remaining systems are negative. ? ?Physical Exam: ?Well-developed frail in no acute distress.  ?Skin is warm and dry.  ?HEENT is normal.  ?Neck is supple.  ?Chest is clear to auscultation with normal expansion.  ?Cardiovascular exam is regular rate and rhythm.  2/6 systolic murmur left sternal border.  S2 is not diminished. ?Abdominal exam  nontender or distended. No masses palpated. ?Extremities show no edema. ?neuro grossly intact ? ? ?A/P ? ?1 aortic stenosis-mild to moderate on most recent echocardiogram.  Will need follow-up study March 2024.  She understands she may require TAVR in the future. ? ?2 coronary artery disease-continue aspirin and statin.  Her symptoms have improved since she had amlodipine added.  She does not have exertional chest pain.  I discussed the potential for a stress nuclear study today to screen for ischemia.  However given her age I think it would be appropriate to be conservative and they are in agreement.  We can reconsider if her symptoms worsen. ? ?3 carotid artery disease-minimal disease on most recent Dopplers.  Continue medical therapy. ? ?4 hypertension-patient's blood pressure is controlled.  Continue present medications. ? ?5 hyperlipidemia-continue statin. ? ?Kirk Ruths, MD ? ? ? ?

## 2021-08-19 ENCOUNTER — Encounter (INDEPENDENT_AMBULATORY_CARE_PROVIDER_SITE_OTHER): Payer: Medicare HMO | Admitting: Ophthalmology

## 2021-08-19 DIAGNOSIS — H35033 Hypertensive retinopathy, bilateral: Secondary | ICD-10-CM

## 2021-08-19 DIAGNOSIS — H353231 Exudative age-related macular degeneration, bilateral, with active choroidal neovascularization: Secondary | ICD-10-CM | POA: Diagnosis not present

## 2021-08-19 DIAGNOSIS — H43813 Vitreous degeneration, bilateral: Secondary | ICD-10-CM | POA: Diagnosis not present

## 2021-08-19 DIAGNOSIS — I10 Essential (primary) hypertension: Secondary | ICD-10-CM | POA: Diagnosis not present

## 2021-09-16 ENCOUNTER — Other Ambulatory Visit (INDEPENDENT_AMBULATORY_CARE_PROVIDER_SITE_OTHER): Payer: Self-pay | Admitting: Ophthalmology

## 2021-09-16 ENCOUNTER — Encounter (INDEPENDENT_AMBULATORY_CARE_PROVIDER_SITE_OTHER): Payer: Medicare HMO | Admitting: Ophthalmology

## 2021-09-16 DIAGNOSIS — H4311 Vitreous hemorrhage, right eye: Secondary | ICD-10-CM

## 2021-09-16 DIAGNOSIS — H35033 Hypertensive retinopathy, bilateral: Secondary | ICD-10-CM | POA: Diagnosis not present

## 2021-09-16 DIAGNOSIS — H43813 Vitreous degeneration, bilateral: Secondary | ICD-10-CM

## 2021-09-16 DIAGNOSIS — I10 Essential (primary) hypertension: Secondary | ICD-10-CM

## 2021-09-16 DIAGNOSIS — H353231 Exudative age-related macular degeneration, bilateral, with active choroidal neovascularization: Secondary | ICD-10-CM

## 2021-09-23 DIAGNOSIS — E7801 Familial hypercholesterolemia: Secondary | ICD-10-CM | POA: Diagnosis not present

## 2021-09-23 DIAGNOSIS — E119 Type 2 diabetes mellitus without complications: Secondary | ICD-10-CM | POA: Diagnosis not present

## 2021-09-23 DIAGNOSIS — D61818 Other pancytopenia: Secondary | ICD-10-CM | POA: Diagnosis not present

## 2021-09-23 DIAGNOSIS — E039 Hypothyroidism, unspecified: Secondary | ICD-10-CM | POA: Diagnosis not present

## 2021-09-30 DIAGNOSIS — E1152 Type 2 diabetes mellitus with diabetic peripheral angiopathy with gangrene: Secondary | ICD-10-CM | POA: Diagnosis not present

## 2021-09-30 DIAGNOSIS — D61818 Other pancytopenia: Secondary | ICD-10-CM | POA: Diagnosis not present

## 2021-09-30 DIAGNOSIS — E7800 Pure hypercholesterolemia, unspecified: Secondary | ICD-10-CM | POA: Diagnosis not present

## 2021-09-30 DIAGNOSIS — N1832 Chronic kidney disease, stage 3b: Secondary | ICD-10-CM | POA: Diagnosis not present

## 2021-09-30 DIAGNOSIS — I10 Essential (primary) hypertension: Secondary | ICD-10-CM | POA: Diagnosis not present

## 2021-09-30 DIAGNOSIS — I2510 Atherosclerotic heart disease of native coronary artery without angina pectoris: Secondary | ICD-10-CM | POA: Diagnosis not present

## 2021-09-30 DIAGNOSIS — E039 Hypothyroidism, unspecified: Secondary | ICD-10-CM | POA: Diagnosis not present

## 2021-09-30 DIAGNOSIS — I700 Atherosclerosis of aorta: Secondary | ICD-10-CM | POA: Diagnosis not present

## 2021-10-14 ENCOUNTER — Encounter (INDEPENDENT_AMBULATORY_CARE_PROVIDER_SITE_OTHER): Payer: Medicare HMO | Admitting: Ophthalmology

## 2021-10-14 DIAGNOSIS — H43813 Vitreous degeneration, bilateral: Secondary | ICD-10-CM | POA: Diagnosis not present

## 2021-10-14 DIAGNOSIS — I10 Essential (primary) hypertension: Secondary | ICD-10-CM | POA: Diagnosis not present

## 2021-10-14 DIAGNOSIS — H353231 Exudative age-related macular degeneration, bilateral, with active choroidal neovascularization: Secondary | ICD-10-CM

## 2021-10-14 DIAGNOSIS — H4311 Vitreous hemorrhage, right eye: Secondary | ICD-10-CM

## 2021-10-14 DIAGNOSIS — H35033 Hypertensive retinopathy, bilateral: Secondary | ICD-10-CM | POA: Diagnosis not present

## 2021-11-11 ENCOUNTER — Encounter (INDEPENDENT_AMBULATORY_CARE_PROVIDER_SITE_OTHER): Payer: Medicare HMO | Admitting: Ophthalmology

## 2021-11-11 DIAGNOSIS — H4311 Vitreous hemorrhage, right eye: Secondary | ICD-10-CM

## 2021-11-11 DIAGNOSIS — H353231 Exudative age-related macular degeneration, bilateral, with active choroidal neovascularization: Secondary | ICD-10-CM

## 2021-11-11 DIAGNOSIS — I10 Essential (primary) hypertension: Secondary | ICD-10-CM

## 2021-11-11 DIAGNOSIS — H43813 Vitreous degeneration, bilateral: Secondary | ICD-10-CM | POA: Diagnosis not present

## 2021-11-11 DIAGNOSIS — H35033 Hypertensive retinopathy, bilateral: Secondary | ICD-10-CM | POA: Diagnosis not present

## 2021-11-13 ENCOUNTER — Ambulatory Visit: Payer: Medicare HMO | Admitting: Cardiology

## 2021-11-18 DIAGNOSIS — H401131 Primary open-angle glaucoma, bilateral, mild stage: Secondary | ICD-10-CM | POA: Diagnosis not present

## 2021-12-01 DIAGNOSIS — E039 Hypothyroidism, unspecified: Secondary | ICD-10-CM | POA: Diagnosis not present

## 2021-12-08 NOTE — Progress Notes (Unsigned)
Cardiology Office Note:    Date:  12/09/2021   ID:  Patricia Moran, DOB Oct 18, 1929, MRN [id number]  PCP:  [facility], [place] Cardiologist: Kirk Ruths, MD   Reason for visit: 4 Month follow-up  History of Present Illness:    Patricia Moran is a 88 y.o. female with a hx of  CAD, mitral regurgitation, aortic stenosis.  LHC 06/30/11: LAD diffuse 40-50%, circumflex irregular with less than 10% stenosis, mid RCA 50-60%, EF 55-65%  I last saw her in March 2023.  She mentioned episodes of left-sided chest pain.  We added amlodipine for antianginal therapy.  Imdur deferred given her history of syncope with nitroglycerin.  She followed up in April with Dr. Stanford Breed and her chest pain had improved with amlodipine.  Today, she denies chest pain and arm pain.  She denies shortness of breath, PND, orthopnea and lower extremity edema.  No palpitations or lightheadedness.  Taking medications without issues.  They do voice concerns of memory loss and are considering Prevagen.      Patient lives independently.  Her daughter checks on her about every other day.  Patient does not drive.  Patient gets on a stationary bike 3 times a week without chest pain or shortness of breath.    Past Medical History:  Diagnosis Date   Anemia    Felt secondary renal insuffiency, seen by Dr. Ralene Ok - Workup in the past has included normal iron, folate, B12, serum and urine protein electrophoresis as well as an ANA. Peripheral blood smear had been normal   Carotid arterial disease (HCC)    Last dopplers 10/6718 - RICA 94-70%, LICA 9-62% for recheck in 1 year   Coronary artery disease    Moderate nonobstructive disease by cath 06/2011 (ruled out for MI/PE at that time)   Dyslipidemia    Esophageal reflux    Gout    Hypertensive retinopathy    Macular degeneration (senile) of retina, unspecified    Osteoporosis    Other and unspecified hyperlipidemia    Pancytopenia 04/19/2008   Intermittent  mild leukopenia and thrombocytopenia felt to be most likely due to immune dysregulation per Dr. Ralene Ok   Type II or unspecified type diabetes mellitus without mention of complication, not stated as uncontrolled    Unspecified essential hypertension     Past Surgical History:  Procedure Laterality Date   abdominal cyst     ABDOMINAL HYSTERECTOMY     cyst removal   APPENDECTOMY     age 56   CATARACT EXTRACTION     EYE SURGERY     LEFT HEART CATHETERIZATION WITH CORONARY ANGIOGRAM N/A 06/30/2011   Procedure: LEFT HEART CATHETERIZATION WITH CORONARY ANGIOGRAM;  Surgeon: Peter M Martinique, MD;  Location: [facility] CATH LAB;  Service: Cardiovascular;  Laterality: N/A;    Current Medications: Current Outpatient Medications on File Prior to Visit  Medication Sig Dispense Refill   acetaZOLAMIDE ER (DIAMOX) 500 MG capsule Take 500 mg by mouth every 12 (twelve) hours.     allopurinol (ZYLOPRIM) 100 MG tablet Take 50 mg by mouth daily.      aspirin 81 MG tablet Take 81 mg by mouth daily.       Bevacizumab (AVASTIN IV) Inject into the vein. Injection in left eye. Every 4 weeks.     bisoprolol (ZEBETA) 5 MG tablet Take 2.5 mg by mouth 2 (two) times daily.     Blood Glucose Monitoring Suppl (ONE TOUCH ULTRA 2) w/Device KIT USE  AS DIRECTED BY PHYSICIAN FOR GLUCOSE MONITORING     brimonidine (ALPHAGAN) 0.2 % ophthalmic solution 1 drop into affected eye     Calcium Carbonate (CALTRATE 600 PO) Take 100 mg by mouth at bedtime.      cyclopentolate (CYCLODRYL,CYCLOGYL) 1 % ophthalmic solution Place 1 drop into the right eye 2 (two) times daily. 2 mL 0   diazepam (VALIUM) 5 MG tablet Take 5 mg by mouth as needed (as needed for eye injection).      dorzolamide (TRUSOPT) 2 % ophthalmic solution 1 drop into affected eye     levothyroxine (SYNTHROID) 50 MCG tablet 1 tablet in the morning on an empty stomach     lisinopril (ZESTRIL) 20 MG tablet Take 1 tablet (20 mg total) by mouth daily. 90 tablet 3   methocarbamol  (ROBAXIN) 500 MG tablet Take 1 tablet (500 mg total) by mouth 2 (two) times daily. 20 tablet 0   moxifloxacin (VIGAMOX) 0.5 % ophthalmic solution Place 1 drop into both eyes as needed. Takes with injection in the eye     Multiple Vitamins-Minerals (OCUVITE PRESERVISION) TABS Take 1 tablet by mouth 2 (two) times a day.      Pediatric Multiple Vitamins (FLINSTONES GUMMIES OMEGA-3 DHA) CHEW Chew by mouth.     pioglitazone (ACTOS) 15 MG tablet Take 2 tablets (30 mg total) by mouth daily. SEE COMMENT (Patient taking differently: Take 15 mg by mouth daily.)     simvastatin (ZOCOR) 40 MG tablet take 1 tablet by mouth in the evening once a day     amLODipine (NORVASC) 2.5 MG tablet Take 1 tablet (2.5 mg total) by mouth daily. 90 tablet 3   No current facility-administered medications on file prior to visit.    Allergies:   Hydrocodone, Nitroglycerin, Nitroglycerin er, Ofloxacin, and Sulfonamide derivatives   Social History   Socioeconomic History   Marital status: Widowed    Spouse name: Not on file   Number of children: Not on file   Years of education: Not on file   Highest education level: Not on file  Occupational History   Not on file  Tobacco Use   Smoking status: Never   Smokeless tobacco: Never  Vaping Use   Vaping Use: Never used  Substance and Sexual Activity   Alcohol use: No   Drug use: No   Sexual activity: Never    Birth control/protection: Post-menopausal  Other Topics Concern   Not on file  Social History Narrative   Lives in [place] and lives alone, 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    Social Determinants of Health   Financial Resource Strain: Not on file  Food Insecurity: Not on file  Transportation Needs: Not on file  Physical Activity: Not on file  Stress: Not on file  Social Connections: Not on file     Family History: The patient's family history includes Cancer in her maternal aunt; Coronary artery disease in an other family member; Heart attack in her  father.  ROS:   Please see the history of present illness.     EKGs/Labs/Other Studies Reviewed:    Recent Labs: No results found for requested labs within last 365 days.   Recent Lipid Panel Lab Results  Component Value Date/Time   CHOL 140 09/02/2020 12:00 AM   TRIG 124 09/02/2020 12:00 AM   HDL 50 09/02/2020 12:00 AM   LDLCALC 68 09/02/2020 12:00 AM    Physical Exam:    VS:  BP (!) 158/62  Pulse (!) 59   Ht 5' (1.524 m)   Wt 124 lb 12.8 oz (56.6 kg)   BMI 24.37 kg/m    No data found.  HYPERTENSION CONTROL Vitals:   12/09/21 0855 12/09/21 0932  BP: (!) 160/66 (!) 158/62    The patient's blood pressure is elevated above target today.  In order to address the patient's elevated BP: Blood pressure will be monitored at home to determine if medication changes need to be made.       Wt Readings from Last 3 Encounters:  12/09/21 124 lb 12.8 oz (56.6 kg)  08/13/21 122 lb 9.6 oz (55.6 kg)  07/02/21 119 lb 9.6 oz (54.3 kg)     GEN:  Well nourished, well developed in no acute distress HEENT: Normal NECK: No JVD CARDIAC: RRR, systolic murmur with radiation to carotids RESPIRATORY:  Clear to auscultation without rales, wheezing or rhonchi  ABDOMEN: Soft, non-tender, non-distended MUSCULOSKELETAL: No edema SKIN: Warm and dry NEUROLOGIC:  Alert and oriented PSYCHIATRIC:  Normal affect    ASSESSMENT AND PLAN   Coronary artery disease, no angina Precordial pain, resolved -LHC 06/30/11: LAD diffuse 40-50%, circumflex irregular with less than 10% stenosis, mid RCA 50-60%, EF 55-65%. -Reported history of syncope with nitroglycerin. -Continue amlodipine for anti-anginal therapy. -Continue aspirin, beta- blocker and statin therapy  Valvular disease with aortic stenosis and mitral regurgitation -Echo 06/2021: Normal EF, grade 2 diastolic dysfunction, mild to moderate MR, severe thickening of AV, mild to moderate AS, 42mHg mean PG, 238m peak PG, AVA 0.98 cm. -Per Dr.  CrStanford Breedrecheck 2D echo in March 2024 -Euvolemic on exam, no CP, SOB or syncope.     HTN, BP elevated today -Blood pressure elevated today though has recently been reasonably controlled.   -I have asked the patient to send in a few BP readings with goal SBP 120s-140s.   -She is currently on amlodipine 2.5 mg daily, Bisoprolol 5 mg half tablet twice daily and lisinopril 20 mg daily.  Labs monitored by PCP - labs checked in August - [place]  Hyperlipidemia -Continue simvastatin.  Lipids followed by PCP.  Memory loss -Advised patient that Prevagen was not shown to have clinically significant impact on neurocognitive tasks.  Gave information on nutrition, exercise & social benefits on memory. -Also advised patient to talk to PCP or neurologist for evaluation of Alzheimer's disease.   Disposition - Follow-up echo in March 2024, follow-up appointment in April.   Medication Adjustments/Labs and Tests Ordered: Current medicines are reviewed at length with the patient today.  Concerns regarding medicines are outlined above.  Orders Placed This Encounter  Procedures   ECHOCARDIOGRAM COMPLETE   No orders of the defined types were placed in this encounter.   Patient Instructions  Medication Instructions:  Your physician recommends that you continue on your current medications as directed. Please refer to the Current Medication list given to you today.  *If you need a refill on your cardiac medications before your next appointment, please call your pharmacy*  Lab Work: NONE ordered at this time of appointment   If you have labs (blood work) drawn today and your tests are completely normal, you will receive your results only by: [place] you have [place]Chart) OR A paper copy in the mail If you have any lab test that is abnormal or we need to change your treatment, we will call you to review the results.  Testing/Procedures: Your physician has requested  that you have an echocardiogram. Echocardiography is a painless test that uses sound waves to create images of your heart. It provides your doctor with information about the size and shape of your heart and how well your heart's chambers and valves are working. This procedure takes approximately one hour. There are no restrictions for this procedure.  Please schedule for March 2024  Follow-Up: At [facility], you and your health needs are our priority.  As part of our continuing mission to provide you with exceptional heart care, we have created designated Provider Care Teams.  These Care Teams include your primary Cardiologist (physician) and Advanced Practice Providers (APPs -  Physician Assistants and Nurse Practitioners) who all work together to provide you with the care you need, when you need it.  Your next appointment:   8 month(s)  The format for your next appointment:   In Person  Provider:   Kirk Ruths, MD    Other Instructions Continue to monitor blood pressure at home. Send 2-4 readings of the 2 week blood pressure readings via MyChart or call the office at [phone]. As discussed at today's visit a good systolic (top number) blood pressure reading 120-140  Tips to Measure your Blood Pressure Correctly  To determine whether you have hypertension, a medical professional will take a blood pressure reading. How you prepare for the test, the position of your arm, and other factors can change a blood pressure reading by 10% or more. That could be enough to hide high blood pressure, start you on a drug you don't really need, or lead your doctor to incorrectly adjust your medications.  National and international guidelines offer specific instructions for measuring blood pressure. If a doctor, nurse, or medical assistant isn't doing it right, don't hesitate to ask him or her to get with the guidelines.  Here's what you can do to ensure a correct reading:  Don't drink a  caffeinated beverage or smoke during the 30 minutes before the test.  Sit quietly for five minutes before the test begins.  During the measurement, sit in a chair with your feet on the floor and your arm supported so your elbow is at about heart level.  The inflatable part of the cuff should completely cover at least 80% of your upper arm, and the cuff should be placed on bare skin, not over a shirt.  Don't talk during the measurement.  Have your blood pressure measured twice, with a brief break in between. If the readings are different by 5 points or more, have it done a third time.  There are times to break these rules. If you sometimes feel lightheaded when getting out of bed in the morning or when you stand after sitting, you should have your blood pressure checked while seated and then while standing to see if it falls from one position to the next.  Because blood pressure varies throughout the day, your doctor will rarely diagnose hypertension on the basis of a single reading. Instead, he or she will want to confirm the measurements on at least two occasions, usually within a few weeks of one another. The exception to this rule is if you have a blood pressure reading of 180/110 mm Hg or higher. A result this high usually calls for prompt treatment.  It's a good idea to have your blood pressure measured in both arms at least once, since the reading in one arm (usually the right) may be higher than that in the left. A  2014 study in [employer] of nearly 3,400 people found average arm- to-arm differences in systolic blood pressure of about 5 points. The higher number should be used to make treatment decisions.  In 2017, new guidelines from the [place], the [employer] of Cardiology, and nine other health organizations lowered the diagnosis of high blood pressure to 130/80 mm Hg or higher for all adults. The guidelines also redefined the various blood  pressure categories to now include normal, elevated, Stage 1 hypertension, Stage 2 hypertension, and hypertensive crisis (see "Blood pressure categories").  Blood pressure categories  Blood pressure category SYSTOLIC (upper number)  DIASTOLIC (lower number)  Normal Less than 120 mm Hg and Less than 80 mm Hg  Elevated 120-129 mm Hg and Less than 80 mm Hg  High blood pressure: Stage 1 hypertension 130-139 mm Hg or 80-89 mm Hg  High blood pressure: Stage 2 hypertension 140 mm Hg or higher or 90 mm Hg or higher  Hypertensive crisis (consult your doctor immediately) Higher than 180 mm Hg and/or Higher than 120 mm Hg  Source: American Heart Association and American Stroke Association. For more on getting your blood pressure under control, buy Controlling Your Blood Pressure, a Special Health Report from [facility].   Blood Pressure Log   Date   Time  Blood Pressure  Position  Example: Nov 1 9 AM 124/78 sitting                                                        Important Information About Sugar         Signed, Gaston Islam  12/09/2021 9:47 AM    [facility]

## 2021-12-09 ENCOUNTER — Encounter: Payer: Self-pay | Admitting: Physician Assistant

## 2021-12-09 ENCOUNTER — Ambulatory Visit: Payer: Medicare HMO | Admitting: Physician Assistant

## 2021-12-09 VITALS — BP 158/62 | HR 59 | Ht 60.0 in | Wt 124.8 lb

## 2021-12-09 DIAGNOSIS — E7800 Pure hypercholesterolemia, unspecified: Secondary | ICD-10-CM | POA: Diagnosis not present

## 2021-12-09 DIAGNOSIS — I350 Nonrheumatic aortic (valve) stenosis: Secondary | ICD-10-CM

## 2021-12-09 DIAGNOSIS — I2510 Atherosclerotic heart disease of native coronary artery without angina pectoris: Secondary | ICD-10-CM

## 2021-12-09 DIAGNOSIS — I10 Essential (primary) hypertension: Secondary | ICD-10-CM

## 2021-12-09 DIAGNOSIS — R072 Precordial pain: Secondary | ICD-10-CM

## 2021-12-09 NOTE — Patient Instructions (Addendum)
Medication Instructions:  Your physician recommends that you continue on your current medications as directed. Please refer to the Current Medication list given to you today.  *If you need a refill on your cardiac medications before your next appointment, please call your pharmacy*  Lab Work: NONE ordered at this time of appointment   If you have labs (blood work) drawn today and your tests are completely normal, you will receive your results only by: [place] (if you have MyChart) OR A paper copy in the mail If you have any lab test that is abnormal or we need to change your treatment, we will call you to review the results.  Testing/Procedures: Your physician has requested that you have an echocardiogram. Echocardiography is a painless test that uses sound waves to create images of your heart. It provides your doctor with information about the size and shape of your heart and how well your heart's chambers and valves are working. This procedure takes approximately one hour. There are no restrictions for this procedure.  Please schedule for March 2024  Follow-Up: At [facility], you and your health needs are our priority.  As part of our continuing mission to provide you with exceptional heart care, we have created designated Provider Care Teams.  These Care Teams include your primary Cardiologist (physician) and Advanced Practice Providers (APPs -  Physician Assistants and Nurse Practitioners) who all work together to provide you with the care you need, when you need it.  Your next appointment:   8 month(s)  The format for your next appointment:   In Person  Provider:   Kirk Ruths, MD    Other Instructions Continue to monitor blood pressure at home. Send 2-4 readings of the 2 week blood pressure readings via MyChart or call the office at [phone]. As discussed at today's visit a good systolic (top number) blood pressure reading 120-140  Tips to Measure your Blood  Pressure Correctly  To determine whether you have hypertension, a medical professional will take a blood pressure reading. How you prepare for the test, the position of your arm, and other factors can change a blood pressure reading by 10% or more. That could be enough to hide high blood pressure, start you on a drug you don't really need, or lead your doctor to incorrectly adjust your medications.  National and international guidelines offer specific instructions for measuring blood pressure. If a doctor, nurse, or medical assistant isn't doing it right, don't hesitate to ask him or her to get with the guidelines.  Here's what you can do to ensure a correct reading:  Don't drink a caffeinated beverage or smoke during the 30 minutes before the test.  Sit quietly for five minutes before the test begins.  During the measurement, sit in a chair with your feet on the floor and your arm supported so your elbow is at about heart level.  The inflatable part of the cuff should completely cover at least 80% of your upper arm, and the cuff should be placed on bare skin, not over a shirt.  Don't talk during the measurement.  Have your blood pressure measured twice, with a brief break in between. If the readings are different by 5 points or more, have it done a third time.  There are times to break these rules. If you sometimes feel lightheaded when getting out of bed in the morning or when you stand after sitting, you should have your blood pressure checked while seated and then  while standing to see if it falls from one position to the next.  Because blood pressure varies throughout the day, your doctor will rarely diagnose hypertension on the basis of a single reading. Instead, he or she will want to confirm the measurements on at least two occasions, usually within a few weeks of one another. The exception to this rule is if you have a blood pressure reading of 180/110 mm Hg or higher. A result this high  usually calls for prompt treatment.  It's a good idea to have your blood pressure measured in both arms at least once, since the reading in one arm (usually the right) may be higher than that in the left. A 2014 study in The American Journal of Medicine of nearly 3,400 people found average arm- to-arm differences in systolic blood pressure of about 5 points. The higher number should be used to make treatment decisions.  In 2017, new guidelines from the [place], the [employer] of Cardiology, and nine other health organizations lowered the diagnosis of high blood pressure to 130/80 mm Hg or higher for all adults. The guidelines also redefined the various blood pressure categories to now include normal, elevated, Stage 1 hypertension, Stage 2 hypertension, and hypertensive crisis (see "Blood pressure categories").  Blood pressure categories  Blood pressure category SYSTOLIC (upper number)  DIASTOLIC (lower number)  Normal Less than 120 mm Hg and Less than 80 mm Hg  Elevated 120-129 mm Hg and Less than 80 mm Hg  High blood pressure: Stage 1 hypertension 130-139 mm Hg or 80-89 mm Hg  High blood pressure: Stage 2 hypertension 140 mm Hg or higher or 90 mm Hg or higher  Hypertensive crisis (consult your doctor immediately) Higher than 180 mm Hg and/or Higher than 120 mm Hg  Source: American Heart Association and American Stroke Association. For more on getting your blood pressure under control, buy Controlling Your Blood Pressure, a Special Health Report from [facility].   Blood Pressure Log   Date   Time  Blood Pressure  Position  Example: Nov 1 9 AM 124/78 sitting                                                        Important Information About Sugar

## 2021-12-16 ENCOUNTER — Encounter (INDEPENDENT_AMBULATORY_CARE_PROVIDER_SITE_OTHER): Payer: Medicare HMO | Admitting: Ophthalmology

## 2021-12-16 DIAGNOSIS — H4311 Vitreous hemorrhage, right eye: Secondary | ICD-10-CM

## 2021-12-16 DIAGNOSIS — H43813 Vitreous degeneration, bilateral: Secondary | ICD-10-CM

## 2021-12-16 DIAGNOSIS — I10 Essential (primary) hypertension: Secondary | ICD-10-CM | POA: Diagnosis not present

## 2021-12-16 DIAGNOSIS — H353231 Exudative age-related macular degeneration, bilateral, with active choroidal neovascularization: Secondary | ICD-10-CM | POA: Diagnosis not present

## 2021-12-16 DIAGNOSIS — H35033 Hypertensive retinopathy, bilateral: Secondary | ICD-10-CM

## 2021-12-20 DIAGNOSIS — I10 Essential (primary) hypertension: Secondary | ICD-10-CM

## 2021-12-25 MED ORDER — AMLODIPINE BESYLATE 5 MG PO TABS: 5.0000 mg | ORAL_TABLET | Freq: Every day | ORAL | 3 refills | Status: DC

## 2022-01-13 ENCOUNTER — Encounter (INDEPENDENT_AMBULATORY_CARE_PROVIDER_SITE_OTHER): Payer: Medicare HMO | Admitting: Ophthalmology

## 2022-01-13 DIAGNOSIS — H35033 Hypertensive retinopathy, bilateral: Secondary | ICD-10-CM | POA: Diagnosis not present

## 2022-01-13 DIAGNOSIS — H43823 Vitreomacular adhesion, bilateral: Secondary | ICD-10-CM | POA: Diagnosis not present

## 2022-01-13 DIAGNOSIS — I10 Essential (primary) hypertension: Secondary | ICD-10-CM

## 2022-01-13 DIAGNOSIS — H4311 Vitreous hemorrhage, right eye: Secondary | ICD-10-CM | POA: Diagnosis not present

## 2022-01-13 DIAGNOSIS — H353231 Exudative age-related macular degeneration, bilateral, with active choroidal neovascularization: Secondary | ICD-10-CM

## 2022-01-26 DIAGNOSIS — L578 Other skin changes due to chronic exposure to nonionizing radiation: Secondary | ICD-10-CM | POA: Diagnosis not present

## 2022-01-26 DIAGNOSIS — D225 Melanocytic nevi of trunk: Secondary | ICD-10-CM | POA: Diagnosis not present

## 2022-01-26 DIAGNOSIS — Z85820 Personal history of malignant melanoma of skin: Secondary | ICD-10-CM | POA: Diagnosis not present

## 2022-02-10 ENCOUNTER — Encounter (INDEPENDENT_AMBULATORY_CARE_PROVIDER_SITE_OTHER): Payer: Medicare HMO | Admitting: Ophthalmology

## 2022-02-10 DIAGNOSIS — I10 Essential (primary) hypertension: Secondary | ICD-10-CM

## 2022-02-10 DIAGNOSIS — H43813 Vitreous degeneration, bilateral: Secondary | ICD-10-CM

## 2022-02-10 DIAGNOSIS — H35033 Hypertensive retinopathy, bilateral: Secondary | ICD-10-CM | POA: Diagnosis not present

## 2022-02-10 DIAGNOSIS — H4311 Vitreous hemorrhage, right eye: Secondary | ICD-10-CM

## 2022-02-10 DIAGNOSIS — H353231 Exudative age-related macular degeneration, bilateral, with active choroidal neovascularization: Secondary | ICD-10-CM

## 2022-03-10 ENCOUNTER — Encounter (INDEPENDENT_AMBULATORY_CARE_PROVIDER_SITE_OTHER): Payer: Medicare HMO | Admitting: Ophthalmology

## 2022-03-10 DIAGNOSIS — H35033 Hypertensive retinopathy, bilateral: Secondary | ICD-10-CM

## 2022-03-10 DIAGNOSIS — H353231 Exudative age-related macular degeneration, bilateral, with active choroidal neovascularization: Secondary | ICD-10-CM | POA: Diagnosis not present

## 2022-03-10 DIAGNOSIS — H4311 Vitreous hemorrhage, right eye: Secondary | ICD-10-CM

## 2022-03-10 DIAGNOSIS — H43813 Vitreous degeneration, bilateral: Secondary | ICD-10-CM

## 2022-03-10 DIAGNOSIS — I10 Essential (primary) hypertension: Secondary | ICD-10-CM

## 2022-03-22 ENCOUNTER — Ambulatory Visit: Payer: Medicare HMO | Admitting: Podiatry

## 2022-03-22 DIAGNOSIS — E1142 Type 2 diabetes mellitus with diabetic polyneuropathy: Secondary | ICD-10-CM | POA: Diagnosis not present

## 2022-03-22 DIAGNOSIS — B351 Tinea unguium: Secondary | ICD-10-CM | POA: Diagnosis not present

## 2022-03-22 DIAGNOSIS — M79675 Pain in left toe(s): Secondary | ICD-10-CM

## 2022-03-22 DIAGNOSIS — M79674 Pain in right toe(s): Secondary | ICD-10-CM | POA: Diagnosis not present

## 2022-03-22 NOTE — Progress Notes (Signed)
  Subjective:  Patient ID: Patricia Moran, female    DOB: 10-25-1929,  MRN: [id number]  Chief Complaint  Patient presents with   Nail Problem    DFC    88 y.o. female presents with the above complaint. History confirmed with patient. Patient presenting with pain related to dystrophic thickened elongated nails. Patient is unable to trim own nails related to nail dystrophy and/or mobility issues. Patient does have a history of T2DM.   Objective:  Physical Exam: warm, good capillary refill nail exam normal nails without lesions, onycholysis, and dystrophic nails DP pulses palpable, PT pulses palpable, and protective sensation intact Left Foot:  Pain with palpation of nails due to elongation and dystrophic growth.  Right Foot: Pain with palpation of nails due to elongation and dystrophic growth.   Assessment:   1. Pain due to onychomycosis of toenails of both feet   2. DM type 2 with diabetic peripheral neuropathy ([place])      Plan:  Patient was evaluated and treated and all questions answered.  Patient educated on diabetes. Discussed proper diabetic foot care and discussed risks and complications of disease. Educated patient in depth on reasons to return to the office immediately should he/she discover anything concerning or new on the feet. All questions answered. Discussed proper shoes as well.   #Onychomycosis with pain  -Nails palliatively debrided as below. -Educated on self-care  Procedure: Nail Debridement Rationale: Pain Type of Debridement: manual, sharp debridement. Instrumentation: Nail nipper, rotary burr. Number of Nails: 10  Return in about 3 months (around 06/21/2022) for [facility].         Everitt Amber, DPM [facility]

## 2022-04-07 ENCOUNTER — Encounter (INDEPENDENT_AMBULATORY_CARE_PROVIDER_SITE_OTHER): Payer: Medicare HMO | Admitting: Ophthalmology

## 2022-04-07 DIAGNOSIS — I10 Essential (primary) hypertension: Secondary | ICD-10-CM

## 2022-04-07 DIAGNOSIS — H35033 Hypertensive retinopathy, bilateral: Secondary | ICD-10-CM | POA: Diagnosis not present

## 2022-04-07 DIAGNOSIS — H43813 Vitreous degeneration, bilateral: Secondary | ICD-10-CM

## 2022-04-07 DIAGNOSIS — H4311 Vitreous hemorrhage, right eye: Secondary | ICD-10-CM

## 2022-04-07 DIAGNOSIS — H353231 Exudative age-related macular degeneration, bilateral, with active choroidal neovascularization: Secondary | ICD-10-CM | POA: Diagnosis not present

## 2022-04-21 DIAGNOSIS — Z0000 Encounter for general adult medical examination without abnormal findings: Secondary | ICD-10-CM | POA: Diagnosis not present

## 2022-04-21 DIAGNOSIS — E039 Hypothyroidism, unspecified: Secondary | ICD-10-CM | POA: Diagnosis not present

## 2022-04-21 DIAGNOSIS — R739 Hyperglycemia, unspecified: Secondary | ICD-10-CM | POA: Diagnosis not present

## 2022-04-28 DIAGNOSIS — I700 Atherosclerosis of aorta: Secondary | ICD-10-CM | POA: Diagnosis not present

## 2022-04-28 DIAGNOSIS — I10 Essential (primary) hypertension: Secondary | ICD-10-CM | POA: Diagnosis not present

## 2022-04-28 DIAGNOSIS — Z0000 Encounter for general adult medical examination without abnormal findings: Secondary | ICD-10-CM | POA: Diagnosis not present

## 2022-04-28 DIAGNOSIS — N1832 Chronic kidney disease, stage 3b: Secondary | ICD-10-CM | POA: Diagnosis not present

## 2022-04-28 DIAGNOSIS — E119 Type 2 diabetes mellitus without complications: Secondary | ICD-10-CM | POA: Diagnosis not present

## 2022-04-28 DIAGNOSIS — I2510 Atherosclerotic heart disease of native coronary artery without angina pectoris: Secondary | ICD-10-CM | POA: Diagnosis not present

## 2022-04-28 DIAGNOSIS — D61818 Other pancytopenia: Secondary | ICD-10-CM | POA: Diagnosis not present

## 2022-04-28 DIAGNOSIS — E039 Hypothyroidism, unspecified: Secondary | ICD-10-CM | POA: Diagnosis not present

## 2022-04-28 DIAGNOSIS — E7800 Pure hypercholesterolemia, unspecified: Secondary | ICD-10-CM | POA: Diagnosis not present

## 2022-05-04 ENCOUNTER — Encounter (INDEPENDENT_AMBULATORY_CARE_PROVIDER_SITE_OTHER): Payer: Medicare HMO | Admitting: Ophthalmology

## 2022-05-04 ENCOUNTER — Other Ambulatory Visit: Payer: Self-pay | Admitting: Physician Assistant

## 2022-05-04 DIAGNOSIS — I10 Essential (primary) hypertension: Secondary | ICD-10-CM | POA: Diagnosis not present

## 2022-05-04 DIAGNOSIS — H4311 Vitreous hemorrhage, right eye: Secondary | ICD-10-CM | POA: Diagnosis not present

## 2022-05-04 DIAGNOSIS — H35033 Hypertensive retinopathy, bilateral: Secondary | ICD-10-CM | POA: Diagnosis not present

## 2022-05-04 DIAGNOSIS — H43813 Vitreous degeneration, bilateral: Secondary | ICD-10-CM | POA: Diagnosis not present

## 2022-05-04 DIAGNOSIS — H353231 Exudative age-related macular degeneration, bilateral, with active choroidal neovascularization: Secondary | ICD-10-CM | POA: Diagnosis not present

## 2022-05-10 DIAGNOSIS — I10 Essential (primary) hypertension: Secondary | ICD-10-CM | POA: Diagnosis not present

## 2022-05-10 DIAGNOSIS — H547 Unspecified visual loss: Secondary | ICD-10-CM | POA: Diagnosis not present

## 2022-05-10 DIAGNOSIS — K08109 Complete loss of teeth, unspecified cause, unspecified class: Secondary | ICD-10-CM | POA: Diagnosis not present

## 2022-05-10 DIAGNOSIS — Z85828 Personal history of other malignant neoplasm of skin: Secondary | ICD-10-CM | POA: Diagnosis not present

## 2022-05-10 DIAGNOSIS — I2510 Atherosclerotic heart disease of native coronary artery without angina pectoris: Secondary | ICD-10-CM | POA: Diagnosis not present

## 2022-05-10 DIAGNOSIS — E119 Type 2 diabetes mellitus without complications: Secondary | ICD-10-CM | POA: Diagnosis not present

## 2022-05-10 DIAGNOSIS — Z8249 Family history of ischemic heart disease and other diseases of the circulatory system: Secondary | ICD-10-CM | POA: Diagnosis not present

## 2022-05-10 DIAGNOSIS — Z880 Allergy status to penicillin: Secondary | ICD-10-CM | POA: Diagnosis not present

## 2022-05-10 DIAGNOSIS — E785 Hyperlipidemia, unspecified: Secondary | ICD-10-CM | POA: Diagnosis not present

## 2022-05-10 DIAGNOSIS — E039 Hypothyroidism, unspecified: Secondary | ICD-10-CM | POA: Diagnosis not present

## 2022-05-10 DIAGNOSIS — M109 Gout, unspecified: Secondary | ICD-10-CM | POA: Diagnosis not present

## 2022-05-10 DIAGNOSIS — Z7984 Long term (current) use of oral hypoglycemic drugs: Secondary | ICD-10-CM | POA: Diagnosis not present

## 2022-06-01 ENCOUNTER — Encounter (INDEPENDENT_AMBULATORY_CARE_PROVIDER_SITE_OTHER): Payer: Medicare HMO | Admitting: Ophthalmology

## 2022-06-01 DIAGNOSIS — H4311 Vitreous hemorrhage, right eye: Secondary | ICD-10-CM

## 2022-06-01 DIAGNOSIS — I10 Essential (primary) hypertension: Secondary | ICD-10-CM

## 2022-06-01 DIAGNOSIS — H35033 Hypertensive retinopathy, bilateral: Secondary | ICD-10-CM

## 2022-06-01 DIAGNOSIS — H353231 Exudative age-related macular degeneration, bilateral, with active choroidal neovascularization: Secondary | ICD-10-CM

## 2022-06-01 DIAGNOSIS — H43813 Vitreous degeneration, bilateral: Secondary | ICD-10-CM | POA: Diagnosis not present

## 2022-06-29 ENCOUNTER — Encounter (INDEPENDENT_AMBULATORY_CARE_PROVIDER_SITE_OTHER): Payer: Medicare HMO | Admitting: Ophthalmology

## 2022-06-29 ENCOUNTER — Ambulatory Visit (INDEPENDENT_AMBULATORY_CARE_PROVIDER_SITE_OTHER): Payer: Medicare HMO | Admitting: Podiatry

## 2022-06-29 DIAGNOSIS — H4311 Vitreous hemorrhage, right eye: Secondary | ICD-10-CM | POA: Diagnosis not present

## 2022-06-29 DIAGNOSIS — B351 Tinea unguium: Secondary | ICD-10-CM

## 2022-06-29 DIAGNOSIS — M79674 Pain in right toe(s): Secondary | ICD-10-CM

## 2022-06-29 DIAGNOSIS — H35033 Hypertensive retinopathy, bilateral: Secondary | ICD-10-CM | POA: Diagnosis not present

## 2022-06-29 DIAGNOSIS — I10 Essential (primary) hypertension: Secondary | ICD-10-CM

## 2022-06-29 DIAGNOSIS — H353231 Exudative age-related macular degeneration, bilateral, with active choroidal neovascularization: Secondary | ICD-10-CM | POA: Diagnosis not present

## 2022-06-29 DIAGNOSIS — H43813 Vitreous degeneration, bilateral: Secondary | ICD-10-CM

## 2022-06-29 DIAGNOSIS — E1142 Type 2 diabetes mellitus with diabetic polyneuropathy: Secondary | ICD-10-CM

## 2022-06-29 DIAGNOSIS — M79675 Pain in left toe(s): Secondary | ICD-10-CM

## 2022-06-29 NOTE — Progress Notes (Signed)
  Subjective:  Patient ID: Patricia Moran, female    DOB: January 18, 1930,  MRN: [id number]  Chief Complaint  Patient presents with   Nail Problem    Diabetic Foot Care     89 y.o. female presents with the above complaint. History confirmed with patient. Patient presenting with pain related to dystrophic thickened elongated nails. Patient is unable to trim own nails related to nail dystrophy and/or mobility issues. Patient does have a history of T2DM.   Objective:  Physical Exam: warm, good capillary refill nail exam normal nails without lesions, onycholysis, and dystrophic nails DP pulses palpable, PT pulses palpable, and protective sensation intact Left Foot:  Pain with palpation of nails due to elongation and dystrophic growth.  Right Foot: Pain with palpation of nails due to elongation and dystrophic growth.   Assessment:   1. Pain due to onychomycosis of toenails of both feet   2. DM type 2 with diabetic peripheral neuropathy ([place])       Plan:  Patient was evaluated and treated and all questions answered.  #Onychomycosis with pain  -Nails palliatively debrided as below. -Educated on self-care  Procedure: Nail Debridement Rationale: Pain Type of Debridement: manual, sharp debridement. Instrumentation: Nail nipper, rotary burr. Number of Nails: 10  Return in about 3 months (around 09/29/2022) for [facility].         Everitt Amber, DPM [facility] [place] [facility]

## 2022-07-20 ENCOUNTER — Ambulatory Visit (HOSPITAL_COMMUNITY): Payer: Medicare HMO | Attending: Physician Assistant

## 2022-07-20 DIAGNOSIS — I350 Nonrheumatic aortic (valve) stenosis: Secondary | ICD-10-CM | POA: Diagnosis not present

## 2022-07-20 DIAGNOSIS — I5030 Unspecified diastolic (congestive) heart failure: Secondary | ICD-10-CM | POA: Diagnosis not present

## 2022-07-20 DIAGNOSIS — I088 Other rheumatic multiple valve diseases: Secondary | ICD-10-CM

## 2022-07-20 LAB — ECHOCARDIOGRAM COMPLETE
AR max vel: 0.78 cm2
AV Area VTI: 0.7 cm2
AV Area mean vel: 0.72 cm2
AV Mean grad: 18 mmHg
AV Peak grad: 30.5 mmHg
Ao pk vel: 2.76 m/s
Area-P 1/2: 3.27 cm2
MV M vel: 5.53 m/s
MV Peak grad: 122.3 mmHg
S' Lateral: 2.6 cm

## 2022-07-21 DIAGNOSIS — H353231 Exudative age-related macular degeneration, bilateral, with active choroidal neovascularization: Secondary | ICD-10-CM | POA: Diagnosis not present

## 2022-07-21 DIAGNOSIS — H31091 Other chorioretinal scars, right eye: Secondary | ICD-10-CM | POA: Diagnosis not present

## 2022-07-21 DIAGNOSIS — H401131 Primary open-angle glaucoma, bilateral, mild stage: Secondary | ICD-10-CM | POA: Diagnosis not present

## 2022-07-21 DIAGNOSIS — H26492 Other secondary cataract, left eye: Secondary | ICD-10-CM | POA: Diagnosis not present

## 2022-07-22 ENCOUNTER — Telehealth: Payer: Self-pay

## 2022-07-22 NOTE — Telephone Encounter (Addendum)
Called patient regarding results. Left message for patient to call office.----- Message from Warren Lacy, PA-C sent at 07/20/2022  9:29 PM EDT ----- Ejection fraction remains in normal range 60-65%. Normal right ventricular systolic function/pulmonary artery pressures. Mild mitral valve regurgitation (estimated mild to moderate previously) Moderate aortic regurgitation. Moderate aortic stenosis.  Schedule follow-up with Dr. Stanford Breed to continue to monitor for any symptoms of valvular heart disease - due for 6 month follow-up appt.

## 2022-08-04 ENCOUNTER — Encounter (INDEPENDENT_AMBULATORY_CARE_PROVIDER_SITE_OTHER): Payer: Medicare HMO | Admitting: Ophthalmology

## 2022-08-04 DIAGNOSIS — H353231 Exudative age-related macular degeneration, bilateral, with active choroidal neovascularization: Secondary | ICD-10-CM | POA: Diagnosis not present

## 2022-08-04 DIAGNOSIS — H35033 Hypertensive retinopathy, bilateral: Secondary | ICD-10-CM | POA: Diagnosis not present

## 2022-08-04 DIAGNOSIS — I10 Essential (primary) hypertension: Secondary | ICD-10-CM

## 2022-08-04 DIAGNOSIS — H4311 Vitreous hemorrhage, right eye: Secondary | ICD-10-CM | POA: Diagnosis not present

## 2022-08-04 DIAGNOSIS — H43813 Vitreous degeneration, bilateral: Secondary | ICD-10-CM

## 2022-08-06 ENCOUNTER — Telehealth: Payer: Self-pay

## 2022-08-06 NOTE — Telephone Encounter (Addendum)
Results seen by patient via MyChart.----- Message from Cannon Kettle, PA-C sent at 07/20/2022  9:29 PM EDT ----- Ejection fraction remains in normal range 60-65%. Normal right ventricular systolic function/pulmonary artery pressures. Mild mitral valve regurgitation (estimated mild to moderate previously) Moderate aortic regurgitation. Moderate aortic stenosis.  Schedule follow-up with Dr. Jens Som to continue to monitor for any symptoms of valvular heart disease - due for 6 month follow-up appt.

## 2022-08-24 DIAGNOSIS — L578 Other skin changes due to chronic exposure to nonionizing radiation: Secondary | ICD-10-CM | POA: Diagnosis not present

## 2022-08-24 DIAGNOSIS — L570 Actinic keratosis: Secondary | ICD-10-CM | POA: Diagnosis not present

## 2022-08-31 NOTE — Progress Notes (Signed)
HPI: FU CAD; seen in the past for chest pain and near syncope. CardioNet monitor was performed in November of 2011 related to syncope and revealed sinus with pacs. LHC 06/30/11: LAD diffuse 40-50%, circumflex irregular with less than 10% stenosis, mid RCA 50-60%, EF 55-65%. Medical therapy was recommended. Carotid dopplers January 2016 showed no significant stenosis. Most recent echocardiogram April 2024 showed normal LV function, grade 2 diastolic dysfunction, mild mitral regurgitation, moderate aortic insufficiency and probable moderate aortic stenosis (mean gradient 18 mmHg, dimensionless index 0.27 and aortic valve area 0.7 cm felt likely to be underestimated in the setting of small LVOT).  Since she was last seen, she denies dyspnea with activities but she has fatigue at times.  No orthopnea, PND, pedal edema or syncope.  She has an occasional pain in her chest that she takes an aspirin which resolves.  It is not exertional.  She has had this intermittently for years.  It is unchanged.  She does not have exertional chest pain.  Current Outpatient Medications  Medication Sig Dispense Refill   allopurinol (ZYLOPRIM) 100 MG tablet Take 50 mg by mouth daily.      amLODipine (NORVASC) 5 MG tablet Take 1 tablet (5 mg total) by mouth daily. 90 tablet 3   aspirin 81 MG tablet Take 81 mg by mouth daily.       Bevacizumab (AVASTIN IV) Inject into the vein. Injection in left eye. Every 4 weeks.     bisoprolol (ZEBETA) 5 MG tablet Take 2.5 mg by mouth 2 (two) times daily.     Blood Glucose Monitoring Suppl (ONE TOUCH ULTRA 2) w/Device KIT USE AS DIRECTED BY PHYSICIAN FOR GLUCOSE MONITORING     brimonidine (ALPHAGAN) 0.2 % ophthalmic solution 1 drop into affected eye     Calcium Carbonate (CALTRATE 600 PO) Take 100 mg by mouth at bedtime.      diazepam (VALIUM) 5 MG tablet Take 5 mg by mouth as needed (as needed for eye injection).      dorzolamide (TRUSOPT) 2 % ophthalmic solution 1 drop into affected  eye     levothyroxine (SYNTHROID) 25 MCG tablet Take 25 mcg by mouth every morning.     levothyroxine (SYNTHROID) 50 MCG tablet 1 tablet in the morning on an empty stomach     lisinopril (ZESTRIL) 20 MG tablet TAKE 1 TABLET BY MOUTH EVERY DAY 90 tablet 3   moxifloxacin (VIGAMOX) 0.5 % ophthalmic solution Place 1 drop into both eyes as needed. Takes with injection in the eye     Multiple Vitamins-Minerals (OCUVITE PRESERVISION) TABS Take 1 tablet by mouth 2 (two) times a day.      Pediatric Multiple Vitamins (FLINSTONES GUMMIES OMEGA-3 DHA) CHEW Chew by mouth.     Pediatric Multivitamins-Iron (FLINTSTONES PLUS IRON PO) Flintstones Plus Iron     pioglitazone (ACTOS) 15 MG tablet Take 2 tablets (30 mg total) by mouth daily. SEE COMMENT (Patient taking differently: Take 15 mg by mouth daily.)     simvastatin (ZOCOR) 40 MG tablet take 1 tablet by mouth in the evening once a day     acetaZOLAMIDE ER (DIAMOX) 500 MG capsule Take 500 mg by mouth every 12 (twelve) hours. (Patient not taking: Reported on 09/07/2022)     Dextran 70-Hypromellose (ARTIFICIAL TEARS) 0.1-0.3 % SOLN Ophthalmic as needed     No current facility-administered medications for this visit.     Past Medical History:  Diagnosis Date   Anemia    Felt  secondary renal insuffiency, seen by Dr. Arline Asp - Workup in the past has included normal iron, folate, B12, serum and urine protein electrophoresis as well as an ANA. Peripheral blood smear had been normal   Carotid arterial disease (HCC)    Last dopplers 12/2010 - RICA 40-59%, LICA 0-39% for recheck in 1 year   Coronary artery disease    Moderate nonobstructive disease by cath 06/2011 (ruled out for MI/PE at that time)   Dyslipidemia    Esophageal reflux    Gout    Hypertensive retinopathy    Macular degeneration (senile) of retina, unspecified    Osteoporosis    Other and unspecified hyperlipidemia    Pancytopenia 04/19/2008   Intermittent mild leukopenia and thrombocytopenia  felt to be most likely due to immune dysregulation per Dr. Arline Asp   Type II or unspecified type diabetes mellitus without mention of complication, not stated as uncontrolled    Unspecified essential hypertension     Past Surgical History:  Procedure Laterality Date   abdominal cyst     ABDOMINAL HYSTERECTOMY     cyst removal   APPENDECTOMY     age 81   CATARACT EXTRACTION     EYE SURGERY     LEFT HEART CATHETERIZATION WITH CORONARY ANGIOGRAM N/A 06/30/2011   Procedure: LEFT HEART CATHETERIZATION WITH CORONARY ANGIOGRAM;  Surgeon: Peter M Swaziland, MD;  Location: [facility] CATH LAB;  Service: Cardiovascular;  Laterality: N/A;    Social History   Socioeconomic History   Marital status: Widowed    Spouse name: Not on file   Number of children: Not on file   Years of education: Not on file   Highest education level: Not on file  Occupational History   Not on file  Tobacco Use   Smoking status: Never   Smokeless tobacco: Never  Vaping Use   Vaping Use: Never used  Substance and Sexual Activity   Alcohol use: No   Drug use: No   Sexual activity: Never    Birth control/protection: Post-menopausal  Other Topics Concern   Not on file  Social History Narrative   Lives in [place] and lives alone, 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    Social Determinants of Health   Financial Resource Strain: Not on file  Food Insecurity: Not on file  Transportation Needs: Not on file  Physical Activity: Not on file  Stress: Not on file  Social Connections: Not on file  Intimate Partner Violence: Not on file    Family History  Problem Relation Age of Onset   Heart attack Father        in his 71's.   Coronary artery disease Other        younger siblings   Cancer Maternal Aunt        GYN    ROS: no fevers or chills, productive cough, hemoptysis, dysphasia, odynophagia, melena, hematochezia, dysuria, hematuria, rash, seizure activity, orthopnea, PND, pedal edema, claudication. Remaining  systems are negative.  Physical Exam: Well-developed well-nourished in no acute distress.  Skin is warm and dry.  HEENT is normal.  Neck is supple.  Chest is clear to auscultation with normal expansion.  Cardiovascular exam is regular rate and rhythm.  2/6 systolic murmur left sternal border.  S2 is not diminished. Abdominal exam nontender or distended. No masses palpated. Extremities show no edema. neuro grossly intact  ECG-normal sinus rhythm at a rate of 62, right bundle branch block.  Personally reviewed  A/P  1 aortic stenosis-probably moderate on  most recent echocardiogram.  Will plan repeat study in 1 year or sooner if she develops symptoms.  She understands to be aware of increasing dyspnea, chest pain or syncope.  She also understands she will likely require aortic valve replacement in the future.  She feels as though she would be agreeable to TAVR if necessary.  This was discussed in detail with she and her daughter.  2 coronary artery disease-patient denies recurrent chest pain.  Continue aspirin and statin.  3 hypertension-blood pressure controlled.  Continue present medications.  4 hyperlipidemia-continue statin.  5 carotid artery disease-minimal on most recent Dopplers.  Olga Millers, MD

## 2022-09-01 ENCOUNTER — Encounter (INDEPENDENT_AMBULATORY_CARE_PROVIDER_SITE_OTHER): Payer: Medicare HMO | Admitting: Ophthalmology

## 2022-09-01 DIAGNOSIS — I10 Essential (primary) hypertension: Secondary | ICD-10-CM

## 2022-09-01 DIAGNOSIS — H353231 Exudative age-related macular degeneration, bilateral, with active choroidal neovascularization: Secondary | ICD-10-CM | POA: Diagnosis not present

## 2022-09-01 DIAGNOSIS — H43813 Vitreous degeneration, bilateral: Secondary | ICD-10-CM | POA: Diagnosis not present

## 2022-09-01 DIAGNOSIS — H35033 Hypertensive retinopathy, bilateral: Secondary | ICD-10-CM

## 2022-09-07 ENCOUNTER — Ambulatory Visit: Payer: Medicare HMO | Attending: Cardiology | Admitting: Cardiology

## 2022-09-07 ENCOUNTER — Encounter: Payer: Self-pay | Admitting: Cardiology

## 2022-09-07 VITALS — BP 166/54 | HR 62 | Ht 60.0 in | Wt 134.0 lb

## 2022-09-07 DIAGNOSIS — I2510 Atherosclerotic heart disease of native coronary artery without angina pectoris: Secondary | ICD-10-CM

## 2022-09-07 DIAGNOSIS — E7800 Pure hypercholesterolemia, unspecified: Secondary | ICD-10-CM | POA: Diagnosis not present

## 2022-09-07 DIAGNOSIS — I350 Nonrheumatic aortic (valve) stenosis: Secondary | ICD-10-CM | POA: Diagnosis not present

## 2022-09-07 DIAGNOSIS — I10 Essential (primary) hypertension: Secondary | ICD-10-CM

## 2022-09-07 NOTE — Patient Instructions (Signed)
    Follow-Up: At [facility], you and your health needs are our priority.  As part of our continuing mission to provide you with exceptional heart care, we have created designated Provider Care Teams.  These Care Teams include your primary Cardiologist (physician) and Advanced Practice Providers (APPs -  Physician Assistants and Nurse Practitioners) who all work together to provide you with the care you need, when you need it.  We recommend signing up for the patient portal called "MyChart".  Sign up information is provided on this After Visit Summary.  MyChart is used to connect with patients for Virtual Visits (Telemedicine).  Patients are able to view lab/test results, encounter notes, upcoming appointments, etc.  Non-urgent messages can be sent to your provider as well.   To learn more about what you can do with MyChart, go to [email or web address].    Your next appointment:   6 month(s)  Provider:   Brian Crenshaw, MD      

## 2022-09-21 ENCOUNTER — Ambulatory Visit: Payer: Medicare HMO | Admitting: Podiatry

## 2022-09-21 DIAGNOSIS — B351 Tinea unguium: Secondary | ICD-10-CM | POA: Diagnosis not present

## 2022-09-21 DIAGNOSIS — M79674 Pain in right toe(s): Secondary | ICD-10-CM | POA: Diagnosis not present

## 2022-09-21 DIAGNOSIS — M79675 Pain in left toe(s): Secondary | ICD-10-CM | POA: Diagnosis not present

## 2022-09-21 DIAGNOSIS — E1142 Type 2 diabetes mellitus with diabetic polyneuropathy: Secondary | ICD-10-CM

## 2022-09-21 NOTE — Progress Notes (Signed)
  Subjective:  Patient ID: Patricia Moran, female    DOB: 1929-10-19,  MRN: [id number]  Chief Complaint  Patient presents with   Nail Problem    Diabetic Foot Care     89 y.o. female presents with the above complaint. History confirmed with patient. Patient presenting with pain related to dystrophic thickened elongated nails. Patient is unable to trim own nails related to nail dystrophy and/or mobility issues. Patient does have a history of T2DM.   Objective:  Physical Exam: warm, good capillary refill nail exam normal nails without lesions, onycholysis, and dystrophic nails DP pulses palpable, PT pulses palpable, and protective sensation intact Left Foot:  Pain with palpation of nails due to elongation and dystrophic growth.  Right Foot: Pain with palpation of nails due to elongation and dystrophic growth.   Assessment:   1. Pain due to onychomycosis of toenails of both feet   2. DM type 2 with diabetic peripheral neuropathy (HCC)        Plan:  Patient was evaluated and treated and all questions answered.  #Onychomycosis with pain  -Nails palliatively debrided as below. -Educated on self-care  Procedure: Nail Debridement Rationale: Pain Type of Debridement: manual, sharp debridement. Instrumentation: Nail nipper, rotary burr. Number of Nails: 10  Return in about 3 months (around 12/22/2022) for [facility].         Corinna Gab, DPM [facility] / [facility]

## 2022-09-29 ENCOUNTER — Encounter (INDEPENDENT_AMBULATORY_CARE_PROVIDER_SITE_OTHER): Payer: Medicare HMO | Admitting: Ophthalmology

## 2022-09-29 DIAGNOSIS — H35033 Hypertensive retinopathy, bilateral: Secondary | ICD-10-CM | POA: Diagnosis not present

## 2022-09-29 DIAGNOSIS — I10 Essential (primary) hypertension: Secondary | ICD-10-CM

## 2022-09-29 DIAGNOSIS — H353231 Exudative age-related macular degeneration, bilateral, with active choroidal neovascularization: Secondary | ICD-10-CM | POA: Diagnosis not present

## 2022-09-29 DIAGNOSIS — H43813 Vitreous degeneration, bilateral: Secondary | ICD-10-CM

## 2022-10-26 DIAGNOSIS — I2510 Atherosclerotic heart disease of native coronary artery without angina pectoris: Secondary | ICD-10-CM | POA: Diagnosis not present

## 2022-10-26 DIAGNOSIS — E7800 Pure hypercholesterolemia, unspecified: Secondary | ICD-10-CM | POA: Diagnosis not present

## 2022-10-26 DIAGNOSIS — E119 Type 2 diabetes mellitus without complications: Secondary | ICD-10-CM | POA: Diagnosis not present

## 2022-10-27 ENCOUNTER — Encounter (INDEPENDENT_AMBULATORY_CARE_PROVIDER_SITE_OTHER): Payer: Medicare HMO | Admitting: Ophthalmology

## 2022-10-27 DIAGNOSIS — I10 Essential (primary) hypertension: Secondary | ICD-10-CM

## 2022-10-27 DIAGNOSIS — H43813 Vitreous degeneration, bilateral: Secondary | ICD-10-CM | POA: Diagnosis not present

## 2022-10-27 DIAGNOSIS — H35033 Hypertensive retinopathy, bilateral: Secondary | ICD-10-CM

## 2022-10-27 DIAGNOSIS — H353231 Exudative age-related macular degeneration, bilateral, with active choroidal neovascularization: Secondary | ICD-10-CM

## 2022-10-27 LAB — LAB REPORT - SCANNED
A1c: 5.9
EGFR: 37

## 2022-11-03 DIAGNOSIS — I10 Essential (primary) hypertension: Secondary | ICD-10-CM | POA: Diagnosis not present

## 2022-11-03 DIAGNOSIS — E039 Hypothyroidism, unspecified: Secondary | ICD-10-CM | POA: Diagnosis not present

## 2022-11-03 DIAGNOSIS — I700 Atherosclerosis of aorta: Secondary | ICD-10-CM | POA: Diagnosis not present

## 2022-11-03 DIAGNOSIS — E119 Type 2 diabetes mellitus without complications: Secondary | ICD-10-CM | POA: Diagnosis not present

## 2022-11-03 DIAGNOSIS — D61818 Other pancytopenia: Secondary | ICD-10-CM | POA: Diagnosis not present

## 2022-11-03 DIAGNOSIS — I2510 Atherosclerotic heart disease of native coronary artery without angina pectoris: Secondary | ICD-10-CM | POA: Diagnosis not present

## 2022-11-03 DIAGNOSIS — N1832 Chronic kidney disease, stage 3b: Secondary | ICD-10-CM | POA: Diagnosis not present

## 2022-11-03 DIAGNOSIS — E7800 Pure hypercholesterolemia, unspecified: Secondary | ICD-10-CM | POA: Diagnosis not present

## 2022-11-24 ENCOUNTER — Encounter (INDEPENDENT_AMBULATORY_CARE_PROVIDER_SITE_OTHER): Payer: Medicare HMO | Admitting: Ophthalmology

## 2022-11-24 DIAGNOSIS — H43813 Vitreous degeneration, bilateral: Secondary | ICD-10-CM | POA: Diagnosis not present

## 2022-11-24 DIAGNOSIS — H35033 Hypertensive retinopathy, bilateral: Secondary | ICD-10-CM | POA: Diagnosis not present

## 2022-11-24 DIAGNOSIS — I10 Essential (primary) hypertension: Secondary | ICD-10-CM | POA: Diagnosis not present

## 2022-11-24 DIAGNOSIS — H353231 Exudative age-related macular degeneration, bilateral, with active choroidal neovascularization: Secondary | ICD-10-CM | POA: Diagnosis not present

## 2022-12-14 ENCOUNTER — Other Ambulatory Visit: Payer: Self-pay | Admitting: Cardiology

## 2022-12-14 DIAGNOSIS — I10 Essential (primary) hypertension: Secondary | ICD-10-CM

## 2022-12-22 ENCOUNTER — Encounter (INDEPENDENT_AMBULATORY_CARE_PROVIDER_SITE_OTHER): Payer: Medicare HMO | Admitting: Ophthalmology

## 2022-12-22 DIAGNOSIS — I10 Essential (primary) hypertension: Secondary | ICD-10-CM

## 2022-12-22 DIAGNOSIS — H353231 Exudative age-related macular degeneration, bilateral, with active choroidal neovascularization: Secondary | ICD-10-CM | POA: Diagnosis not present

## 2022-12-22 DIAGNOSIS — H35033 Hypertensive retinopathy, bilateral: Secondary | ICD-10-CM

## 2022-12-22 DIAGNOSIS — H43813 Vitreous degeneration, bilateral: Secondary | ICD-10-CM

## 2022-12-28 ENCOUNTER — Ambulatory Visit (INDEPENDENT_AMBULATORY_CARE_PROVIDER_SITE_OTHER): Payer: Medicare HMO | Admitting: Podiatry

## 2022-12-28 DIAGNOSIS — M79675 Pain in left toe(s): Secondary | ICD-10-CM

## 2022-12-28 DIAGNOSIS — E1142 Type 2 diabetes mellitus with diabetic polyneuropathy: Secondary | ICD-10-CM

## 2022-12-28 DIAGNOSIS — B351 Tinea unguium: Secondary | ICD-10-CM | POA: Diagnosis not present

## 2022-12-28 DIAGNOSIS — M79674 Pain in right toe(s): Secondary | ICD-10-CM | POA: Diagnosis not present

## 2022-12-28 NOTE — Progress Notes (Signed)
  Subjective:  Patient ID: Patricia Moran, female    DOB: 24-Feb-1930,  MRN: [id number]  Chief Complaint  Patient presents with   Nail Problem    Diabetic Foot Care-nail trim     89 y.o. female presents with the above complaint. History confirmed with patient. Patient presenting with pain related to dystrophic thickened elongated nails. Patient is unable to trim own nails related to nail dystrophy and/or mobility issues. Patient does have a history of T2DM.   Objective:  Physical Exam: warm, good capillary refill nail exam normal nails without lesions, onycholysis, and dystrophic nails DP pulses palpable, PT pulses palpable, and protective sensation intact Left Foot:  Pain with palpation of nails due to elongation and dystrophic growth.  Right Foot: Pain with palpation of nails due to elongation and dystrophic growth.   Assessment:   1. Pain due to onychomycosis of toenails of both feet   2. DM type 2 with diabetic peripheral neuropathy (HCC)         Plan:  Patient was evaluated and treated and all questions answered.  #Onychomycosis with pain  -Nails palliatively debrided as below. -Educated on self-care  Procedure: Nail Debridement Rationale: Pain Type of Debridement: manual, sharp debridement. Instrumentation: Nail nipper, rotary burr. Number of Nails: 10  Return in about 3 months (around 03/29/2023) for [facility].         Corinna Gab, DPM [facility] / [facility]

## 2023-01-19 ENCOUNTER — Encounter (INDEPENDENT_AMBULATORY_CARE_PROVIDER_SITE_OTHER): Payer: Medicare HMO | Admitting: Ophthalmology

## 2023-01-19 DIAGNOSIS — H35033 Hypertensive retinopathy, bilateral: Secondary | ICD-10-CM

## 2023-01-19 DIAGNOSIS — H43813 Vitreous degeneration, bilateral: Secondary | ICD-10-CM

## 2023-01-19 DIAGNOSIS — I10 Essential (primary) hypertension: Secondary | ICD-10-CM | POA: Diagnosis not present

## 2023-01-19 DIAGNOSIS — H353231 Exudative age-related macular degeneration, bilateral, with active choroidal neovascularization: Secondary | ICD-10-CM | POA: Diagnosis not present

## 2023-01-31 DIAGNOSIS — L821 Other seborrheic keratosis: Secondary | ICD-10-CM | POA: Diagnosis not present

## 2023-01-31 DIAGNOSIS — Z85820 Personal history of malignant melanoma of skin: Secondary | ICD-10-CM | POA: Diagnosis not present

## 2023-01-31 DIAGNOSIS — L578 Other skin changes due to chronic exposure to nonionizing radiation: Secondary | ICD-10-CM | POA: Diagnosis not present

## 2023-02-16 DIAGNOSIS — Z961 Presence of intraocular lens: Secondary | ICD-10-CM | POA: Diagnosis not present

## 2023-02-16 DIAGNOSIS — H401131 Primary open-angle glaucoma, bilateral, mild stage: Secondary | ICD-10-CM | POA: Diagnosis not present

## 2023-02-17 ENCOUNTER — Encounter (INDEPENDENT_AMBULATORY_CARE_PROVIDER_SITE_OTHER): Payer: Medicare HMO | Admitting: Ophthalmology

## 2023-02-17 DIAGNOSIS — I10 Essential (primary) hypertension: Secondary | ICD-10-CM

## 2023-02-17 DIAGNOSIS — H353231 Exudative age-related macular degeneration, bilateral, with active choroidal neovascularization: Secondary | ICD-10-CM

## 2023-02-17 DIAGNOSIS — H35033 Hypertensive retinopathy, bilateral: Secondary | ICD-10-CM

## 2023-02-17 DIAGNOSIS — H43813 Vitreous degeneration, bilateral: Secondary | ICD-10-CM

## 2023-03-07 NOTE — Progress Notes (Unsigned)
  Cardiology Office Note   Date:  03/09/2023  ID:  Patricia Moran, DOB Dec 29, 1929, MRN [id number] PCP:  Fatima Sanger, FNP [facility] Cardiologist: Olga Millers, MD  Reason for visit: 6 month follow-up  History of Present Illness    Patricia Moran is a 89 y.o. female with a hx of CAD, mitral regurgitation, aortic stenosis.  LHC 06/30/11: LAD diffuse 40-50%, circumflex irregular with less than 10% stenosis, mid RCA 50-60%, EF 55-65%   I saw her last in August 2023.  She was doing well without chest pain.  She is living independently with her daughter checking on her every other day.  She is using the stationary bike 3 times a week without symptoms.  Saw Dr. Jens Som in May 2024.  Patient mention occasional chest pain which resolves with aspirin, nonexertional, Intermittently for years.  He reviewed recent April 2024 echo --patient with EF 60 to 65%, progression of AS with at least moderate aortic stenosis, moderate aortic regurgitation.  Dr. Jens Som mentioned she may need TAVR in the future.  Today, she comes in with her daughter.  She has not had chest pain recently.  In the past, she takes 2-3 full dose of aspirin if she has chest pain which relieves it.  She cannot identify a trigger for her prior chest pain.  She remains active, living independently.  She walks to her mailbox and does light chores.  She denies shortness of breath, lower extremity edema, lightheadedness and syncope.  She goes regularly to her PCP and sees ophthalmology every month for eye treatments and states her blood pressure is typically within good range.   Objective / Physical Exam   Vital signs:  BP (!) 146/66   Pulse 70   Wt 136 lb 3.2 oz (61.8 kg)   SpO2 97%   BMI 26.60 kg/m     GEN: No acute distress CARDIAC: RRR, systolic murmur radiating to the carotids RESPIRATORY:  Clear to auscultation without rales, wheezing or rhonchi  EXTREMITIES: No edema  Assessment and Plan   Coronary artery  disease, no angina Precordial pain, intermittent/chronic -LHC 06/30/11: LAD diffuse 40-50%, circumflex irregular with less than 10% stenosis, mid RCA 50-60%, EF 55-65%. -Reported history of syncope with nitroglycerin. -Continue aspirin 81 mg daily and statin therapy.   Valvular disease with aortic stenosis and mitral regurgitation -Echo 07/2022: 60 to 65%, progression of AS with at least moderate aortic stenosis, moderate aortic regurgitation.  Dr. Jens Som mentioned she may need TAVR in the future. -Per Dr. Jens Som, recheck 2D echo in April 2025 unless patient develops worsening symptoms -Patient denies shortness of breath, fluid retention, syncope.  Stable and infrequent chest pain.  HTN, BP elevated today -Managed by her PCP.  Patient's daughter states blood pressure well-controlled that other doctors visits.  Mom -Patient is currently taking amlodipine 5 mg daily, Bisoprolol 2.5 mg twice daily and lisinopril 20 mg daily.   Hyperlipidemia -Continue simvastatin.  Lipids followed by PCP.   Disposition - Follow-up in 6 months, call if any change in symptoms.    Signed, Cannon Kettle, PA-C  03/09/2023 [facility]

## 2023-03-09 ENCOUNTER — Ambulatory Visit: Payer: Medicare HMO | Attending: Physician Assistant | Admitting: Physician Assistant

## 2023-03-09 ENCOUNTER — Encounter: Payer: Self-pay | Admitting: Physician Assistant

## 2023-03-09 VITALS — BP 146/66 | HR 70 | Wt 136.2 lb

## 2023-03-09 DIAGNOSIS — I2510 Atherosclerotic heart disease of native coronary artery without angina pectoris: Secondary | ICD-10-CM

## 2023-03-09 DIAGNOSIS — I10 Essential (primary) hypertension: Secondary | ICD-10-CM

## 2023-03-09 DIAGNOSIS — I350 Nonrheumatic aortic (valve) stenosis: Secondary | ICD-10-CM

## 2023-03-09 NOTE — Patient Instructions (Signed)
Medication Instructions:  No Changes *If you need a refill on your cardiac medications before your next appointment, please call your pharmacy*   Lab Work: No Labs If you have labs (blood work) drawn today and your tests are completely normal, you will receive your results only by: MyChart Message (if you have MyChart) OR A paper copy in the mail If you have any lab test that is abnormal or we need to change your treatment, we will call you to review the results.   Testing/Procedures: No Testing   Follow-Up: At [facility], you and your health needs are our priority.  As part of our continuing mission to provide you with exceptional heart care, we have created designated Provider Care Teams.  These Care Teams include your primary Cardiologist (physician) and Advanced Practice Providers (APPs -  Physician Assistants and Nurse Practitioners) who all work together to provide you with the care you need, when you need it.  We recommend signing up for the patient portal called "MyChart".  Sign up information is provided on this After Visit Summary.  MyChart is used to connect with patients for Virtual Visits (Telemedicine).  Patients are able to view lab/test results, encounter notes, upcoming appointments, etc.  Non-urgent messages can be sent to your provider as well.   To learn more about what you can do with MyChart, go to [email or web address].    Your next appointment:   6 month(s)  Provider:   Brian Crenshaw, MD   

## 2023-03-24 ENCOUNTER — Encounter (INDEPENDENT_AMBULATORY_CARE_PROVIDER_SITE_OTHER): Payer: Medicare HMO | Admitting: Ophthalmology

## 2023-03-24 DIAGNOSIS — H35033 Hypertensive retinopathy, bilateral: Secondary | ICD-10-CM

## 2023-03-24 DIAGNOSIS — I10 Essential (primary) hypertension: Secondary | ICD-10-CM

## 2023-03-24 DIAGNOSIS — H353231 Exudative age-related macular degeneration, bilateral, with active choroidal neovascularization: Secondary | ICD-10-CM | POA: Diagnosis not present

## 2023-03-24 DIAGNOSIS — H43813 Vitreous degeneration, bilateral: Secondary | ICD-10-CM

## 2023-03-29 ENCOUNTER — Encounter: Payer: Self-pay | Admitting: Podiatry

## 2023-03-29 ENCOUNTER — Ambulatory Visit: Payer: Medicare HMO | Admitting: Podiatry

## 2023-03-29 DIAGNOSIS — M79675 Pain in left toe(s): Secondary | ICD-10-CM

## 2023-03-29 DIAGNOSIS — M79674 Pain in right toe(s): Secondary | ICD-10-CM

## 2023-03-29 DIAGNOSIS — B351 Tinea unguium: Secondary | ICD-10-CM

## 2023-03-29 DIAGNOSIS — E1142 Type 2 diabetes mellitus with diabetic polyneuropathy: Secondary | ICD-10-CM

## 2023-03-29 NOTE — Progress Notes (Signed)
  Subjective:  Patient ID: Patricia Moran, female    DOB: 02-Jul-1929,  MRN: [id number]  Chief Complaint  Patient presents with   Foot Care    Last A1c: 5.9. Takes ASA 81 mg. Needs nails trimmed. Daughter, Bonita Quin, is with her today.     89 y.o. female presents with the above complaint. History confirmed with patient. Patient presenting with pain related to dystrophic thickened elongated nails. Patient is unable to trim own nails related to nail dystrophy and/or mobility issues. Patient does have a history of T2DM.   Objective:  Physical Exam: warm, good capillary refill nail exam normal nails without lesions, onycholysis, and dystrophic nails DP pulses palpable, PT pulses palpable, and protective sensation intact Left Foot:  Pain with palpation of nails due to elongation and dystrophic growth.  Right Foot: Pain with palpation of nails due to elongation and dystrophic growth.   Assessment:   1. Pain due to onychomycosis of toenails of both feet   2. DM type 2 with diabetic peripheral neuropathy (HCC)         Plan:  Patient was evaluated and treated and all questions answered.  #Onychomycosis with pain  -Nails palliatively debrided as below. -Educated on self-care  Procedure: Nail Debridement Rationale: Pain Type of Debridement: manual, sharp debridement. Instrumentation: Nail nipper, rotary burr. Number of Nails: 10  Return in about 3 months (around 06/27/2023) for Diabetic Foot Care.         Bronwen Betters, DPM [facility] / [facility]

## 2023-04-27 LAB — LAB REPORT - SCANNED: EGFR: 38

## 2023-04-29 ENCOUNTER — Encounter (INDEPENDENT_AMBULATORY_CARE_PROVIDER_SITE_OTHER): Payer: Medicare HMO | Admitting: Ophthalmology

## 2023-04-29 DIAGNOSIS — H353231 Exudative age-related macular degeneration, bilateral, with active choroidal neovascularization: Secondary | ICD-10-CM | POA: Diagnosis not present

## 2023-04-29 DIAGNOSIS — H43813 Vitreous degeneration, bilateral: Secondary | ICD-10-CM | POA: Diagnosis not present

## 2023-04-29 DIAGNOSIS — I10 Essential (primary) hypertension: Secondary | ICD-10-CM | POA: Diagnosis not present

## 2023-04-29 DIAGNOSIS — H35033 Hypertensive retinopathy, bilateral: Secondary | ICD-10-CM | POA: Diagnosis not present

## 2023-05-04 DIAGNOSIS — H9222 Otorrhagia, left ear: Secondary | ICD-10-CM | POA: Diagnosis not present

## 2023-05-04 DIAGNOSIS — E7800 Pure hypercholesterolemia, unspecified: Secondary | ICD-10-CM | POA: Diagnosis not present

## 2023-05-04 DIAGNOSIS — I6521 Occlusion and stenosis of right carotid artery: Secondary | ICD-10-CM | POA: Diagnosis not present

## 2023-05-04 DIAGNOSIS — Z0000 Encounter for general adult medical examination without abnormal findings: Secondary | ICD-10-CM | POA: Diagnosis not present

## 2023-05-04 DIAGNOSIS — D61818 Other pancytopenia: Secondary | ICD-10-CM | POA: Diagnosis not present

## 2023-05-04 DIAGNOSIS — E039 Hypothyroidism, unspecified: Secondary | ICD-10-CM | POA: Diagnosis not present

## 2023-05-04 DIAGNOSIS — N1832 Chronic kidney disease, stage 3b: Secondary | ICD-10-CM | POA: Diagnosis not present

## 2023-05-04 DIAGNOSIS — E119 Type 2 diabetes mellitus without complications: Secondary | ICD-10-CM | POA: Diagnosis not present

## 2023-05-04 DIAGNOSIS — I2510 Atherosclerotic heart disease of native coronary artery without angina pectoris: Secondary | ICD-10-CM | POA: Diagnosis not present

## 2023-05-04 DIAGNOSIS — I700 Atherosclerosis of aorta: Secondary | ICD-10-CM | POA: Diagnosis not present

## 2023-05-04 DIAGNOSIS — I10 Essential (primary) hypertension: Secondary | ICD-10-CM | POA: Diagnosis not present

## 2023-05-06 ENCOUNTER — Encounter (INDEPENDENT_AMBULATORY_CARE_PROVIDER_SITE_OTHER): Payer: Self-pay | Admitting: Otolaryngology

## 2023-05-15 ENCOUNTER — Other Ambulatory Visit: Payer: Self-pay | Admitting: Cardiology

## 2023-05-15 DIAGNOSIS — I10 Essential (primary) hypertension: Secondary | ICD-10-CM

## 2023-05-17 DIAGNOSIS — M25551 Pain in right hip: Secondary | ICD-10-CM | POA: Diagnosis not present

## 2023-05-17 DIAGNOSIS — R070 Pain in throat: Secondary | ICD-10-CM | POA: Diagnosis not present

## 2023-06-03 ENCOUNTER — Encounter (INDEPENDENT_AMBULATORY_CARE_PROVIDER_SITE_OTHER): Payer: Medicare HMO | Admitting: Ophthalmology

## 2023-06-03 DIAGNOSIS — H35033 Hypertensive retinopathy, bilateral: Secondary | ICD-10-CM

## 2023-06-03 DIAGNOSIS — I10 Essential (primary) hypertension: Secondary | ICD-10-CM

## 2023-06-03 DIAGNOSIS — H353231 Exudative age-related macular degeneration, bilateral, with active choroidal neovascularization: Secondary | ICD-10-CM | POA: Diagnosis not present

## 2023-06-03 DIAGNOSIS — H43813 Vitreous degeneration, bilateral: Secondary | ICD-10-CM

## 2023-06-08 ENCOUNTER — Other Ambulatory Visit: Payer: Self-pay | Admitting: Cardiology

## 2023-06-13 DIAGNOSIS — E785 Hyperlipidemia, unspecified: Secondary | ICD-10-CM | POA: Diagnosis not present

## 2023-06-13 DIAGNOSIS — Z008 Encounter for other general examination: Secondary | ICD-10-CM | POA: Diagnosis not present

## 2023-06-13 DIAGNOSIS — H353290 Exudative age-related macular degeneration, unspecified eye, stage unspecified: Secondary | ICD-10-CM | POA: Diagnosis not present

## 2023-06-13 DIAGNOSIS — N1832 Chronic kidney disease, stage 3b: Secondary | ICD-10-CM | POA: Diagnosis not present

## 2023-06-13 DIAGNOSIS — I130 Hypertensive heart and chronic kidney disease with heart failure and stage 1 through stage 4 chronic kidney disease, or unspecified chronic kidney disease: Secondary | ICD-10-CM | POA: Diagnosis not present

## 2023-06-13 DIAGNOSIS — I509 Heart failure, unspecified: Secondary | ICD-10-CM | POA: Diagnosis not present

## 2023-06-13 DIAGNOSIS — E1165 Type 2 diabetes mellitus with hyperglycemia: Secondary | ICD-10-CM | POA: Diagnosis not present

## 2023-06-13 DIAGNOSIS — E1136 Type 2 diabetes mellitus with diabetic cataract: Secondary | ICD-10-CM | POA: Diagnosis not present

## 2023-06-13 DIAGNOSIS — E1142 Type 2 diabetes mellitus with diabetic polyneuropathy: Secondary | ICD-10-CM | POA: Diagnosis not present

## 2023-06-13 DIAGNOSIS — E1151 Type 2 diabetes mellitus with diabetic peripheral angiopathy without gangrene: Secondary | ICD-10-CM | POA: Diagnosis not present

## 2023-06-13 DIAGNOSIS — K219 Gastro-esophageal reflux disease without esophagitis: Secondary | ICD-10-CM | POA: Diagnosis not present

## 2023-06-13 DIAGNOSIS — I700 Atherosclerosis of aorta: Secondary | ICD-10-CM | POA: Diagnosis not present

## 2023-06-13 DIAGNOSIS — E11319 Type 2 diabetes mellitus with unspecified diabetic retinopathy without macular edema: Secondary | ICD-10-CM | POA: Diagnosis not present

## 2023-06-14 ENCOUNTER — Telehealth (INDEPENDENT_AMBULATORY_CARE_PROVIDER_SITE_OTHER): Payer: Self-pay | Admitting: Otolaryngology

## 2023-06-14 NOTE — Telephone Encounter (Signed)
 Reminder Call: Date: 06/15/2023 Status: Sch  Time: 2:00 PM [address] [place], [place] [address]  Confirmed time and location w/patient.

## 2023-06-15 ENCOUNTER — Ambulatory Visit (INDEPENDENT_AMBULATORY_CARE_PROVIDER_SITE_OTHER): Payer: Medicare HMO | Admitting: Otolaryngology

## 2023-06-15 ENCOUNTER — Encounter (INDEPENDENT_AMBULATORY_CARE_PROVIDER_SITE_OTHER): Payer: Self-pay

## 2023-06-15 VITALS — BP 155/62 | HR 68 | Ht 61.5 in | Wt 126.0 lb

## 2023-06-15 DIAGNOSIS — H902 Conductive hearing loss, unspecified: Secondary | ICD-10-CM

## 2023-06-15 DIAGNOSIS — H9202 Otalgia, left ear: Secondary | ICD-10-CM

## 2023-06-15 DIAGNOSIS — H6123 Impacted cerumen, bilateral: Secondary | ICD-10-CM

## 2023-06-15 DIAGNOSIS — H900 Conductive hearing loss, bilateral: Secondary | ICD-10-CM

## 2023-06-16 DIAGNOSIS — H900 Conductive hearing loss, bilateral: Secondary | ICD-10-CM | POA: Insufficient documentation

## 2023-06-16 DIAGNOSIS — H6123 Impacted cerumen, bilateral: Secondary | ICD-10-CM | POA: Insufficient documentation

## 2023-06-16 DIAGNOSIS — H9202 Otalgia, left ear: Secondary | ICD-10-CM | POA: Insufficient documentation

## 2023-06-16 NOTE — Progress Notes (Signed)
 Patient ID: Patricia Moran, female   DOB: 1929-04-25, 89 y.o.   MRN: [id number]  CC: Hearing loss, left ear pain  HPI:  Patricia Moran is a 89 y.o. female who presents today complaining of bilateral muffled hearing and left ear pain for the past 3 months.  The patient has a habit of cleaning her ear canals with Q-tips.  She was recently seen by her primary care provider, and was noted to have blood and wax in her ears.  The patient has no recent history of otitis media or otitis externa.  She has no previous otologic surgery.  Past Medical History:  Diagnosis Date   Anemia    Felt secondary renal insuffiency, seen by Dr. Arline Asp - Workup in the past has included normal iron, folate, B12, serum and urine protein electrophoresis as well as an ANA. Peripheral blood smear had been normal   Carotid arterial disease (HCC)    Last dopplers 12/2010 - RICA 40-59%, LICA 0-39% for recheck in 1 year   Coronary artery disease    Moderate nonobstructive disease by cath 06/2011 (ruled out for MI/PE at that time)   Dyslipidemia    Esophageal reflux    Gout    Hypertensive retinopathy    Macular degeneration (senile) of retina, unspecified    Osteoporosis    Other and unspecified hyperlipidemia    Pancytopenia 04/19/2008   Intermittent mild leukopenia and thrombocytopenia felt to be most likely due to immune dysregulation per Dr. Arline Asp   Type II or unspecified type diabetes mellitus without mention of complication, not stated as uncontrolled    Unspecified essential hypertension     Past Surgical History:  Procedure Laterality Date   abdominal cyst     ABDOMINAL HYSTERECTOMY     cyst removal   APPENDECTOMY     age 66   CATARACT EXTRACTION     EYE SURGERY     LEFT HEART CATHETERIZATION WITH CORONARY ANGIOGRAM N/A 06/30/2011   Procedure: LEFT HEART CATHETERIZATION WITH CORONARY ANGIOGRAM;  Surgeon: Peter M Swaziland, MD;  Location: [facility] CATH LAB;  Service: Cardiovascular;  Laterality: N/A;    Family  History  Problem Relation Age of Onset   Heart attack Father        in his 92's.   Coronary artery disease Other        younger siblings   Cancer Maternal Aunt        GYN    Social History:  reports that she has never smoked. She has never used smokeless tobacco. She reports that she does not drink alcohol and does not use drugs.  Allergies:  Allergies  Allergen Reactions   Hydrocodone Other (See Comments)    unknown   Nitroglycerin     Pt states it makes her pass out   Nitroglycerin Er    Ofloxacin Other (See Comments)    unknown   Sulfonamide Derivatives Other (See Comments)    unknown    Prior to Admission medications   Medication Sig Start Date End Date Taking? Authorizing Provider  acetaZOLAMIDE ER (DIAMOX) 500 MG capsule Take 500 mg by mouth every 12 (twelve) hours. 11/09/20  Yes [provider]  allopurinol (ZYLOPRIM) 100 MG tablet Take 50 mg by mouth daily.    Yes [provider]  amLODipine (NORVASC) 5 MG tablet TAKE 1 TABLET (5 MG TOTAL) BY MOUTH DAILY. 12/14/22  Yes Swaziland, Peter M, MD  aspirin 81 MG tablet Take 81 mg by mouth daily.  Yes [provider]  Bevacizumab (AVASTIN IV) Inject into the vein. Injection in left eye. Every 4 weeks.   Yes [provider]  bisoprolol (ZEBETA) 5 MG tablet Take 2.5 mg by mouth 2 (two) times daily.   Yes [provider]  Blood Glucose Monitoring Suppl (ONE TOUCH ULTRA 2) w/Device KIT USE AS DIRECTED BY PHYSICIAN FOR GLUCOSE MONITORING   Yes [provider]  brimonidine (ALPHAGAN) 0.2 % ophthalmic solution 1 drop into affected eye   Yes [provider]  Calcium Carbonate (CALTRATE 600 PO) Take 100 mg by mouth at bedtime.    Yes [provider]  Dextran 70-Hypromellose (ARTIFICIAL TEARS) 0.1-0.3 % SOLN Ophthalmic as needed   Yes [provider]  diazepam (VALIUM) 5 MG tablet Take 5 mg by mouth as needed (as needed for eye injection).    Yes [provider]  dorzolamide (TRUSOPT) 2 % ophthalmic solution 1 drop into affected eye   Yes [provider]  levothyroxine (SYNTHROID) 25 MCG tablet Take 25 mcg by mouth every morning.   Yes [provider]  levothyroxine (SYNTHROID) 50 MCG tablet 1 tablet in the morning on an empty stomach 04/22/21  Yes [provider]  lisinopril (ZESTRIL) 20 MG tablet TAKE 1 TABLET BY MOUTH EVERY DAY 06/09/23  Yes Lewayne Bunting, MD  moxifloxacin (VIGAMOX) 0.5 % ophthalmic solution Place 1 drop into both eyes as needed. Takes with injection in the eye   Yes [provider]  Multiple Vitamins-Minerals (OCUVITE PRESERVISION) TABS Take 1 tablet by mouth 2 (two) times a day.    Yes [provider]  Pediatric Multiple Vitamins (FLINSTONES GUMMIES OMEGA-3 DHA) CHEW Chew by mouth.   Yes [provider]  Pediatric Multivitamins-Iron (FLINTSTONES PLUS IRON PO) Flintstones Plus Iron   Yes [provider]  pioglitazone (ACTOS) 15 MG tablet Take 2 tablets (30 mg total) by mouth daily. SEE COMMENT Patient taking differently: Take 15 mg by mouth daily. 07/01/11  Yes Dunn, Dayna N, PA-C  simvastatin (ZOCOR) 40 MG tablet take 1 tablet by mouth in the evening once a day   Yes [provider]    Blood pressure (!) 155/62, pulse 68, height 5' 1.5" (1.562 m), weight 126 lb (57.2 kg), SpO2 96%. Exam: General: Communicates without difficulty, well nourished, no acute distress. Head: Normocephalic, no evidence injury, no tenderness, facial buttresses intact without stepoff. Face/sinus: No tenderness to palpation and percussion. Facial movement is normal and symmetric. Eyes: PERRL, EOMI. No scleral icterus, conjunctivae clear. Neuro: CN II exam reveals vision grossly intact.  No nystagmus at any point of gaze. Ears: Auricles well formed without lesions.  EAC: Bilateral cerumen impaction.  Dried blood clots are also noted within the ear canals.  Under the operating  microscope, the cerumen and dried blood clots are carefully removed with a combination of cerumen currette, alligator forceps, and suction catheters.  After the cerumen is removed, the TMs are noted to be normal.  Nose: External evaluation reveals normal support and skin without lesions.  Dorsum is intact.  Anterior rhinoscopy reveals normal mucosa over anterior aspect of inferior turbinates and intact septum.  No purulence noted. Oral:  Oral cavity and oropharynx are intact, symmetric, without erythema or edema.  Mucosa is moist without lesions. Neck: Full range of motion without pain.  There is no significant lymphadenopathy.  No masses palpable.  Thyroid bed within normal limits to palpation.  Parotid glands and submandibular glands equal bilaterally without mass.  Trachea is midline. Neuro:  CN 2-12 grossly intact.   Assessment: 1.  Bilateral cerumen impaction.  The patient also has dried blood clots in her ear canals. 2.  Likely transient conductive hearing loss, secondary to the cerumen impaction.  The patient reports significant improvement in her hearing after the disimpaction procedure. 3.  Her tympanic membranes and middle ear spaces are normal.  Plan: 1.  Otomicroscopy with bilateral cerumen disimpaction. 2.  The physical exam findings are reviewed with the patient. 3.  The patient is instructed not to use Q-tips to clean the ear canals. 4.  The patient is encouraged to call with any questions or concerns.   Vernor Monnig W Danah Reinecke 06/16/2023, 9:08 AM

## 2023-07-05 ENCOUNTER — Encounter: Payer: Self-pay | Admitting: Podiatry

## 2023-07-05 ENCOUNTER — Ambulatory Visit (INDEPENDENT_AMBULATORY_CARE_PROVIDER_SITE_OTHER): Payer: Medicare HMO | Admitting: Podiatry

## 2023-07-05 DIAGNOSIS — B351 Tinea unguium: Secondary | ICD-10-CM | POA: Diagnosis not present

## 2023-07-05 DIAGNOSIS — M79674 Pain in right toe(s): Secondary | ICD-10-CM | POA: Diagnosis not present

## 2023-07-05 DIAGNOSIS — M79675 Pain in left toe(s): Secondary | ICD-10-CM | POA: Diagnosis not present

## 2023-07-05 DIAGNOSIS — E1142 Type 2 diabetes mellitus with diabetic polyneuropathy: Secondary | ICD-10-CM

## 2023-07-05 NOTE — Progress Notes (Signed)
  Subjective:  Patient ID: Patricia Moran, female    DOB: [birth year],  MRN: [id number]  Chief Complaint  Patient presents with   [facility]    Last A1c: 5.9. Takes ASA.     89 y.o. female presents with the above complaint. History confirmed with patient. Patient presenting with pain related to dystrophic thickened elongated nails. Patient is unable to trim own nails related to nail dystrophy and/or mobility issues. Patient does have a history of T2DM.   Objective:  Physical Exam: warm, good capillary refill nail exam normal nails without lesions, onycholysis, and dystrophic nails DP pulses palpable, PT pulses palpable, and protective sensation intact Left Foot:  Pain with palpation of nails due to elongation and dystrophic growth.  Right Foot: Pain with palpation of nails due to elongation and dystrophic growth.   Assessment:   1. Pain due to onychomycosis of toenails of both feet   2. DM type 2 with diabetic peripheral neuropathy (HCC)         Plan:  Patient was evaluated and treated and all questions answered.  #Onychomycosis with pain  -Nails palliatively debrided as below. -Educated on self-care  Procedure: Nail Debridement Rationale: Pain Type of Debridement: manual, sharp debridement. Instrumentation: Nail nipper, rotary burr. Number of Nails: 10  Return in about 3 months (around 10/05/2023) for Diabetic Foot Care.         Bronwen Betters, DPM [facility] / [facility]

## 2023-07-12 ENCOUNTER — Encounter (INDEPENDENT_AMBULATORY_CARE_PROVIDER_SITE_OTHER): Payer: Medicare HMO | Admitting: Ophthalmology

## 2023-07-12 DIAGNOSIS — I10 Essential (primary) hypertension: Secondary | ICD-10-CM | POA: Diagnosis not present

## 2023-07-12 DIAGNOSIS — H353231 Exudative age-related macular degeneration, bilateral, with active choroidal neovascularization: Secondary | ICD-10-CM | POA: Diagnosis not present

## 2023-07-12 DIAGNOSIS — H35033 Hypertensive retinopathy, bilateral: Secondary | ICD-10-CM

## 2023-07-12 DIAGNOSIS — H43813 Vitreous degeneration, bilateral: Secondary | ICD-10-CM | POA: Diagnosis not present

## 2023-07-23 ENCOUNTER — Other Ambulatory Visit: Payer: Self-pay | Admitting: Cardiology

## 2023-07-23 DIAGNOSIS — I10 Essential (primary) hypertension: Secondary | ICD-10-CM

## 2023-07-25 MED ORDER — AMLODIPINE BESYLATE 5 MG PO TABS
5.0000 mg | ORAL_TABLET | Freq: Every day | ORAL | 2 refills | Status: DC
Start: 2023-07-25 — End: 2023-10-27

## 2023-08-06 DIAGNOSIS — R0981 Nasal congestion: Secondary | ICD-10-CM | POA: Diagnosis not present

## 2023-08-06 DIAGNOSIS — R059 Cough, unspecified: Secondary | ICD-10-CM | POA: Diagnosis not present

## 2023-08-06 DIAGNOSIS — R062 Wheezing: Secondary | ICD-10-CM | POA: Diagnosis not present

## 2023-08-19 DIAGNOSIS — H401131 Primary open-angle glaucoma, bilateral, mild stage: Secondary | ICD-10-CM | POA: Diagnosis not present

## 2023-08-19 DIAGNOSIS — H31091 Other chorioretinal scars, right eye: Secondary | ICD-10-CM | POA: Diagnosis not present

## 2023-08-19 DIAGNOSIS — H353231 Exudative age-related macular degeneration, bilateral, with active choroidal neovascularization: Secondary | ICD-10-CM | POA: Diagnosis not present

## 2023-08-23 ENCOUNTER — Encounter (INDEPENDENT_AMBULATORY_CARE_PROVIDER_SITE_OTHER): Admitting: Ophthalmology

## 2023-08-23 DIAGNOSIS — H353231 Exudative age-related macular degeneration, bilateral, with active choroidal neovascularization: Secondary | ICD-10-CM

## 2023-08-23 DIAGNOSIS — H43813 Vitreous degeneration, bilateral: Secondary | ICD-10-CM | POA: Diagnosis not present

## 2023-08-23 DIAGNOSIS — I10 Essential (primary) hypertension: Secondary | ICD-10-CM | POA: Diagnosis not present

## 2023-08-23 DIAGNOSIS — H35033 Hypertensive retinopathy, bilateral: Secondary | ICD-10-CM

## 2023-09-05 NOTE — Progress Notes (Signed)
 HPI: FU CAD and AS; seen in the past for chest pain and near syncope. CardioNet monitor was performed in November of 2011 related to syncope and revealed sinus with pacs. LHC 06/30/11: LAD diffuse 40-50%, circumflex irregular with less than 10% stenosis, mid RCA 50-60%, EF 55-65%. Medical therapy was recommended. Carotid dopplers January 2016 showed no significant stenosis. Most recent echocardiogram April 2024 showed normal LV function, grade 2 diastolic dysfunction, mild mitral regurgitation, moderate aortic insufficiency and probable moderate aortic stenosis (mean gradient 18 mmHg, dimensionless index 0.27 and aortic valve area 0.7 cm felt likely to be underestimated in the setting of small LVOT).  Since she was last seen, she denies increased dyspnea or syncope.  She has vague pain in the left chest and under her left breast occasionally relieved with aspirin .  Lasting 15 minutes.  She does not have exertional chest pain.  Current Outpatient Medications  Medication Sig Dispense Refill   allopurinol  (ZYLOPRIM ) 100 MG tablet Take 50 mg by mouth daily.      amLODipine  (NORVASC ) 5 MG tablet Take 1 tablet (5 mg total) by mouth daily. 90 tablet 2   aspirin  81 MG tablet Take 81 mg by mouth daily.       Bevacizumab  (AVASTIN  IV) Inject into the vein. Injection in left eye. Every 4 weeks.     acetaZOLAMIDE ER (DIAMOX) 500 MG capsule Take 500 mg by mouth every 12 (twelve) hours. (Patient not taking: Reported on 09/09/2023)     bisoprolol  (ZEBETA ) 5 MG tablet Take 2.5 mg by mouth 2 (two) times daily.     Blood Glucose Monitoring Suppl (ONE TOUCH ULTRA 2) w/Device KIT USE AS DIRECTED BY PHYSICIAN FOR GLUCOSE MONITORING     brimonidine (ALPHAGAN) 0.2 % ophthalmic solution 1 drop into affected eye     Calcium Carbonate (CALTRATE 600 PO) Take 100 mg by mouth at bedtime.      Dextran 70-Hypromellose (ARTIFICIAL TEARS) 0.1-0.3 % SOLN Ophthalmic as needed     diazepam  (VALIUM ) 5 MG tablet Take 5 mg by mouth as  needed (as needed for eye injection).      dorzolamide (TRUSOPT) 2 % ophthalmic solution 1 drop into affected eye     levothyroxine (SYNTHROID) 25 MCG tablet Take 25 mcg by mouth every morning.     lisinopril  (ZESTRIL ) 20 MG tablet TAKE 1 TABLET BY MOUTH EVERY DAY 90 tablet 2   moxifloxacin (VIGAMOX) 0.5 % ophthalmic solution Place 1 drop into both eyes as needed. Takes with injection in the eye     Multiple Vitamins-Minerals (OCUVITE PRESERVISION) TABS Take 1 tablet by mouth 2 (two) times a day.      Pediatric Multiple Vitamins (FLINSTONES GUMMIES OMEGA-3 DHA) CHEW Chew by mouth.     Pediatric Multivitamins-Iron (FLINTSTONES PLUS IRON PO) Flintstones Plus Iron     pioglitazone  (ACTOS ) 15 MG tablet Take 2 tablets (30 mg total) by mouth daily. SEE COMMENT     simvastatin  (ZOCOR ) 40 MG tablet take 1 tablet by mouth in the evening once a day     No current facility-administered medications for this visit.     Past Medical History:  Diagnosis Date   Anemia    Felt secondary renal insuffiency, seen by Dr. Purcell Bruce - Workup in the past has included normal iron, folate, B12, serum and urine protein electrophoresis as well as an ANA. Peripheral blood smear had been normal   Carotid arterial disease (HCC)    Last dopplers 12/2010 - RICA 40-59%,  LICA 0-39% for recheck in 1 year   Coronary artery disease    Moderate nonobstructive disease by cath 06/2011 (ruled out for MI/PE at that time)   Dyslipidemia    Esophageal reflux    Gout    Hypertensive retinopathy    Macular degeneration (senile) of retina, unspecified    Osteoporosis    Other and unspecified hyperlipidemia    Pancytopenia 04/19/2008   Intermittent mild leukopenia and thrombocytopenia felt to be most likely due to immune dysregulation per Dr. Purcell Bruce   Type II or unspecified type diabetes mellitus without mention of complication, not stated as uncontrolled    Unspecified essential hypertension     Past Surgical History:   Procedure Laterality Date   abdominal cyst     ABDOMINAL HYSTERECTOMY     cyst removal   APPENDECTOMY     age 8   CATARACT EXTRACTION     EYE SURGERY     LEFT HEART CATHETERIZATION WITH CORONARY ANGIOGRAM N/A 06/30/2011   Procedure: LEFT HEART CATHETERIZATION WITH CORONARY ANGIOGRAM;  Surgeon: Peter M Swaziland, MD;  Location: [facility] CATH LAB;  Service: Cardiovascular;  Laterality: N/A;    Social History   Socioeconomic History   Marital status: Widowed    Spouse name: Not on file   Number of children: Not on file   Years of education: Not on file   Highest education level: Not on file  Occupational History   Not on file  Tobacco Use   Smoking status: Never   Smokeless tobacco: Never  Vaping Use   Vaping status: Never Used  Substance and Sexual Activity   Alcohol  use: No   Drug use: No   Sexual activity: Never    Birth control/protection: Post-menopausal  Other Topics Concern   Not on file  Social History Narrative   Lives in [place] and lives alone, 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    Social Drivers of Corporate investment banker Strain: Not on file  Food Insecurity: Not on file  Transportation Needs: Not on file  Physical Activity: Not on file  Stress: Not on file  Social Connections: Not on file  Intimate Partner Violence: Not on file    Family History  Problem Relation Age of Onset   Heart attack Father        in his 76's.   Coronary artery disease Other        younger siblings   Cancer Maternal Aunt        GYN    ROS: no fevers or chills, productive cough, hemoptysis, dysphasia, odynophagia, melena, hematochezia, dysuria, hematuria, rash, seizure activity, orthopnea, PND, pedal edema, claudication. Remaining systems are negative.  Physical Exam: Well-developed well-nourished in no acute distress.  Skin is warm and dry.  HEENT is normal.  Neck is supple.  Chest is clear to auscultation with normal expansion.  Cardiovascular exam is regular rate and  rhythm.  2/6 systolic murmur left sternal border. Abdominal exam nontender or distended. No masses palpated. Extremities show no edema. neuro grossly intact  EKG Interpretation Date/Time:  Friday Sep 09 2023 09:01:33 EDT Ventricular Rate:  57 PR Interval:  190 QRS Duration:  128 QT Interval:  472 QTC Calculation: 459 R Axis:   95  Text Interpretation: Sinus bradycardia Right bundle branch block Confirmed by Alexandria Angel ([id number]) on 09/09/2023 9:03:57 AM    A/P  1 aortic stenosis-patient continues to deny dyspnea, exertional chest pain or syncope.  Plan follow-up echocardiogram.  May require TAVR  in the future.  2 coronary artery disease-patient has some atypical chest pain but does not sound cardiac.  She does not have exertional symptoms.  Continue medical therapy with aspirin  and statin.  3 hyperlipidemia-continue statin.  4 hypertension-patient's blood pressure is controlled.  Continue present medical regimen.  5 history of carotid artery disease-minimal on most recent Dopplers.  Alexandria Angel, MD

## 2023-09-09 ENCOUNTER — Ambulatory Visit: Payer: Medicare HMO | Attending: Cardiology | Admitting: Cardiology

## 2023-09-09 ENCOUNTER — Encounter: Payer: Self-pay | Admitting: Cardiology

## 2023-09-09 VITALS — BP 146/58 | HR 63 | Ht 61.5 in | Wt 131.0 lb

## 2023-09-09 DIAGNOSIS — I2510 Atherosclerotic heart disease of native coronary artery without angina pectoris: Secondary | ICD-10-CM | POA: Diagnosis not present

## 2023-09-09 DIAGNOSIS — I10 Essential (primary) hypertension: Secondary | ICD-10-CM | POA: Diagnosis not present

## 2023-09-09 DIAGNOSIS — I350 Nonrheumatic aortic (valve) stenosis: Secondary | ICD-10-CM | POA: Diagnosis not present

## 2023-09-09 DIAGNOSIS — E7800 Pure hypercholesterolemia, unspecified: Secondary | ICD-10-CM | POA: Diagnosis not present

## 2023-09-09 NOTE — Patient Instructions (Signed)
 Medication Instructions:  No medication changes were made at this visit. Continue current regimen.   *If you need a refill on your cardiac medications before your next appointment, please call your pharmacy*  Lab Work: None ordered today. If you have labs (blood work) drawn today and your tests are completely normal, you will receive your results only by: MyChart Message (if you have MyChart) OR A paper copy in the mail If you have any lab test that is abnormal or we need to change your treatment, we will call you to review the results.  Testing/Procedures: Your physician has requested that you have an echocardiogram. Echocardiography is a painless test that uses sound waves to create images of your heart. It provides your doctor with information about the size and shape of your heart and how well your heart's chambers and valves are working. This procedure takes approximately one hour. There are no restrictions for this procedure. Please do NOT wear cologne, perfume, aftershave, or lotions (deodorant is allowed). Please arrive 15 minutes prior to your appointment time.  Please note: We ask at that you not bring children with you during ultrasound (echo/ vascular) testing. Due to room size and safety concerns, children are not allowed in the ultrasound rooms during exams. Our front office staff cannot provide observation of children in our lobby area while testing is being conducted. An adult accompanying a patient to their appointment will only be allowed in the ultrasound room at the discretion of the ultrasound technician under special circumstances. We apologize for any inconvenience.   Follow-Up: At [facility], you and your health needs are our priority.  As part of our continuing mission to provide you with exceptional heart care, our providers are all part of one team.  This team includes your primary Cardiologist (physician) and Advanced Practice Providers or APPs (Physician  Assistants and Nurse Practitioners) who all work together to provide you with the care you need, when you need it.  Your next appointment:   6 month(s)  Provider:   Alexandria Angel, MD

## 2023-10-04 ENCOUNTER — Encounter (INDEPENDENT_AMBULATORY_CARE_PROVIDER_SITE_OTHER): Admitting: Ophthalmology

## 2023-10-04 ENCOUNTER — Ambulatory Visit: Admitting: Podiatry

## 2023-10-04 DIAGNOSIS — I10 Essential (primary) hypertension: Secondary | ICD-10-CM | POA: Diagnosis not present

## 2023-10-04 DIAGNOSIS — H353231 Exudative age-related macular degeneration, bilateral, with active choroidal neovascularization: Secondary | ICD-10-CM | POA: Diagnosis not present

## 2023-10-04 DIAGNOSIS — H35033 Hypertensive retinopathy, bilateral: Secondary | ICD-10-CM

## 2023-10-04 DIAGNOSIS — H43813 Vitreous degeneration, bilateral: Secondary | ICD-10-CM | POA: Diagnosis not present

## 2023-10-07 ENCOUNTER — Encounter (INDEPENDENT_AMBULATORY_CARE_PROVIDER_SITE_OTHER): Admitting: Ophthalmology

## 2023-10-07 DIAGNOSIS — H353231 Exudative age-related macular degeneration, bilateral, with active choroidal neovascularization: Secondary | ICD-10-CM

## 2023-10-07 DIAGNOSIS — H43813 Vitreous degeneration, bilateral: Secondary | ICD-10-CM | POA: Diagnosis not present

## 2023-10-07 DIAGNOSIS — H20011 Primary iridocyclitis, right eye: Secondary | ICD-10-CM | POA: Diagnosis not present

## 2023-10-07 DIAGNOSIS — I10 Essential (primary) hypertension: Secondary | ICD-10-CM

## 2023-10-07 DIAGNOSIS — H35033 Hypertensive retinopathy, bilateral: Secondary | ICD-10-CM | POA: Diagnosis not present

## 2023-10-10 ENCOUNTER — Encounter (INDEPENDENT_AMBULATORY_CARE_PROVIDER_SITE_OTHER): Admitting: Ophthalmology

## 2023-10-10 DIAGNOSIS — H20011 Primary iridocyclitis, right eye: Secondary | ICD-10-CM | POA: Diagnosis not present

## 2023-10-10 DIAGNOSIS — H43813 Vitreous degeneration, bilateral: Secondary | ICD-10-CM

## 2023-10-10 DIAGNOSIS — H353231 Exudative age-related macular degeneration, bilateral, with active choroidal neovascularization: Secondary | ICD-10-CM | POA: Diagnosis not present

## 2023-10-10 DIAGNOSIS — I10 Essential (primary) hypertension: Secondary | ICD-10-CM

## 2023-10-10 DIAGNOSIS — H35033 Hypertensive retinopathy, bilateral: Secondary | ICD-10-CM

## 2023-10-11 ENCOUNTER — Encounter: Payer: Self-pay | Admitting: Podiatry

## 2023-10-11 ENCOUNTER — Ambulatory Visit (INDEPENDENT_AMBULATORY_CARE_PROVIDER_SITE_OTHER): Admitting: Podiatry

## 2023-10-11 DIAGNOSIS — B351 Tinea unguium: Secondary | ICD-10-CM | POA: Diagnosis not present

## 2023-10-11 DIAGNOSIS — E1142 Type 2 diabetes mellitus with diabetic polyneuropathy: Secondary | ICD-10-CM

## 2023-10-11 DIAGNOSIS — M79674 Pain in right toe(s): Secondary | ICD-10-CM | POA: Diagnosis not present

## 2023-10-11 DIAGNOSIS — M79675 Pain in left toe(s): Secondary | ICD-10-CM | POA: Diagnosis not present

## 2023-10-11 NOTE — Progress Notes (Signed)
  Subjective:  Patient ID: Patricia Moran, female    DOB: [birth year],  MRN: [id number]  Chief Complaint  Patient presents with   [facility]    [facility] with out callous. Last A1c was good the last time she had it last time. Takes ASA.     89 y.o. female presents with the above complaint. History confirmed with patient. Patient presenting with pain related to dystrophic thickened elongated nails. Patient is unable to trim own nails related to nail dystrophy and/or mobility issues. Patient does have a history of T2DM.  She is unsure of her last A1c.  Objective:  Physical Exam: warm, good capillary refill nail exam normal nails without lesions, onycholysis, and dystrophic nails DP pulses palpable, PT pulses palpable, and protective sensation intact Left Foot:  Pain with palpation of nails due to elongation and dystrophic growth.  Right Foot: Pain with palpation of nails due to elongation and dystrophic growth.   Assessment:   1. Pain due to onychomycosis of toenails of both feet   2. DM type 2 with diabetic peripheral neuropathy (HCC)         Plan:  Patient was evaluated and treated and all questions answered.  #Onychomycosis with pain  -Nails palliatively debrided as below. -Educated on self-care  Procedure: Nail Debridement Rationale: Pain Type of Debridement: manual, sharp debridement. Instrumentation: Nail nipper, rotary burr. Number of Nails: 10  Return in about 3 months (around 01/11/2024) for Diabetic Foot Care.         Ethan Saddler, DPM [facility] / [facility]

## 2023-10-13 ENCOUNTER — Encounter (INDEPENDENT_AMBULATORY_CARE_PROVIDER_SITE_OTHER): Admitting: Ophthalmology

## 2023-10-13 DIAGNOSIS — I10 Essential (primary) hypertension: Secondary | ICD-10-CM

## 2023-10-13 DIAGNOSIS — H353231 Exudative age-related macular degeneration, bilateral, with active choroidal neovascularization: Secondary | ICD-10-CM | POA: Diagnosis not present

## 2023-10-13 DIAGNOSIS — H35033 Hypertensive retinopathy, bilateral: Secondary | ICD-10-CM | POA: Diagnosis not present

## 2023-10-13 DIAGNOSIS — H43813 Vitreous degeneration, bilateral: Secondary | ICD-10-CM | POA: Diagnosis not present

## 2023-10-13 DIAGNOSIS — H20011 Primary iridocyclitis, right eye: Secondary | ICD-10-CM

## 2023-10-17 ENCOUNTER — Encounter (INDEPENDENT_AMBULATORY_CARE_PROVIDER_SITE_OTHER): Admitting: Ophthalmology

## 2023-10-17 DIAGNOSIS — H353231 Exudative age-related macular degeneration, bilateral, with active choroidal neovascularization: Secondary | ICD-10-CM

## 2023-10-17 DIAGNOSIS — I10 Essential (primary) hypertension: Secondary | ICD-10-CM | POA: Diagnosis not present

## 2023-10-17 DIAGNOSIS — H35033 Hypertensive retinopathy, bilateral: Secondary | ICD-10-CM | POA: Diagnosis not present

## 2023-10-17 DIAGNOSIS — H43813 Vitreous degeneration, bilateral: Secondary | ICD-10-CM | POA: Diagnosis not present

## 2023-10-17 DIAGNOSIS — H20011 Primary iridocyclitis, right eye: Secondary | ICD-10-CM | POA: Diagnosis not present

## 2023-10-20 ENCOUNTER — Ambulatory Visit (HOSPITAL_COMMUNITY)
Admission: RE | Admit: 2023-10-20 | Discharge: 2023-10-20 | Disposition: A | Discharge status: Home / Self Care | Source: Ambulatory Visit | Attending: Cardiology | Admitting: Cardiology

## 2023-10-20 DIAGNOSIS — I350 Nonrheumatic aortic (valve) stenosis: Secondary | ICD-10-CM

## 2023-10-20 LAB — ECHOCARDIOGRAM COMPLETE
AR max vel: 0.83 cm2
AV Area VTI: 0.71 cm2
AV Area mean vel: 0.8 cm2
AV Mean grad: 20 mmHg
AV Peak grad: 33.9 mmHg
Ao pk vel: 2.91 m/s
Area-P 1/2: 1.6 cm2
MV M vel: 6.21 m/s
MV Peak grad: 154.3 mmHg
MV VTI: 1.17 cm2
P 1/2 time: 693 ms
S' Lateral: 2.6 cm

## 2023-10-21 ENCOUNTER — Ambulatory Visit: Payer: Self-pay | Admitting: Cardiology

## 2023-10-25 ENCOUNTER — Other Ambulatory Visit: Payer: Self-pay | Admitting: Cardiology

## 2023-10-25 ENCOUNTER — Encounter (INDEPENDENT_AMBULATORY_CARE_PROVIDER_SITE_OTHER): Admitting: Ophthalmology

## 2023-10-25 DIAGNOSIS — H20011 Primary iridocyclitis, right eye: Secondary | ICD-10-CM

## 2023-10-25 DIAGNOSIS — H35033 Hypertensive retinopathy, bilateral: Secondary | ICD-10-CM | POA: Diagnosis not present

## 2023-10-25 DIAGNOSIS — I10 Essential (primary) hypertension: Secondary | ICD-10-CM

## 2023-10-25 DIAGNOSIS — H353231 Exudative age-related macular degeneration, bilateral, with active choroidal neovascularization: Secondary | ICD-10-CM | POA: Diagnosis not present

## 2023-10-25 DIAGNOSIS — H43813 Vitreous degeneration, bilateral: Secondary | ICD-10-CM

## 2023-10-25 DIAGNOSIS — E039 Hypothyroidism, unspecified: Secondary | ICD-10-CM | POA: Diagnosis not present

## 2023-10-25 DIAGNOSIS — D61818 Other pancytopenia: Secondary | ICD-10-CM | POA: Diagnosis not present

## 2023-10-25 DIAGNOSIS — E119 Type 2 diabetes mellitus without complications: Secondary | ICD-10-CM | POA: Diagnosis not present

## 2023-10-27 MED ORDER — AMLODIPINE BESYLATE 5 MG PO TABS: 5.0000 mg | ORAL_TABLET | Freq: Every day | ORAL | 2 refills | Status: AC

## 2023-11-01 DIAGNOSIS — N1832 Chronic kidney disease, stage 3b: Secondary | ICD-10-CM | POA: Diagnosis not present

## 2023-11-01 DIAGNOSIS — E119 Type 2 diabetes mellitus without complications: Secondary | ICD-10-CM | POA: Diagnosis not present

## 2023-11-01 DIAGNOSIS — I10 Essential (primary) hypertension: Secondary | ICD-10-CM | POA: Diagnosis not present

## 2023-11-01 DIAGNOSIS — D61818 Other pancytopenia: Secondary | ICD-10-CM | POA: Diagnosis not present

## 2023-11-01 DIAGNOSIS — I6521 Occlusion and stenosis of right carotid artery: Secondary | ICD-10-CM | POA: Diagnosis not present

## 2023-11-01 DIAGNOSIS — N189 Chronic kidney disease, unspecified: Secondary | ICD-10-CM | POA: Diagnosis not present

## 2023-11-01 DIAGNOSIS — E039 Hypothyroidism, unspecified: Secondary | ICD-10-CM | POA: Diagnosis not present

## 2023-11-01 DIAGNOSIS — I2510 Atherosclerotic heart disease of native coronary artery without angina pectoris: Secondary | ICD-10-CM | POA: Diagnosis not present

## 2023-11-01 DIAGNOSIS — I700 Atherosclerosis of aorta: Secondary | ICD-10-CM | POA: Diagnosis not present

## 2023-11-01 LAB — LAB REPORT - SCANNED
A1c: 5.7
EGFR (Non-African Amer.): 40
TSH: 1.87 (ref 0.41–5.90)

## 2023-11-08 ENCOUNTER — Encounter (INDEPENDENT_AMBULATORY_CARE_PROVIDER_SITE_OTHER): Admitting: Ophthalmology

## 2023-11-08 DIAGNOSIS — H35033 Hypertensive retinopathy, bilateral: Secondary | ICD-10-CM

## 2023-11-08 DIAGNOSIS — I10 Essential (primary) hypertension: Secondary | ICD-10-CM

## 2023-11-08 DIAGNOSIS — H353231 Exudative age-related macular degeneration, bilateral, with active choroidal neovascularization: Secondary | ICD-10-CM

## 2023-11-08 DIAGNOSIS — H43813 Vitreous degeneration, bilateral: Secondary | ICD-10-CM | POA: Diagnosis not present

## 2023-11-08 DIAGNOSIS — H20011 Primary iridocyclitis, right eye: Secondary | ICD-10-CM

## 2023-11-29 ENCOUNTER — Other Ambulatory Visit: Payer: Self-pay | Admitting: Cardiology

## 2023-11-29 ENCOUNTER — Encounter (INDEPENDENT_AMBULATORY_CARE_PROVIDER_SITE_OTHER): Admitting: Ophthalmology

## 2023-11-29 DIAGNOSIS — H43813 Vitreous degeneration, bilateral: Secondary | ICD-10-CM

## 2023-11-29 DIAGNOSIS — I10 Essential (primary) hypertension: Secondary | ICD-10-CM

## 2023-11-29 DIAGNOSIS — H353231 Exudative age-related macular degeneration, bilateral, with active choroidal neovascularization: Secondary | ICD-10-CM

## 2023-11-29 DIAGNOSIS — H35033 Hypertensive retinopathy, bilateral: Secondary | ICD-10-CM

## 2023-12-27 ENCOUNTER — Encounter (INDEPENDENT_AMBULATORY_CARE_PROVIDER_SITE_OTHER): Admitting: Ophthalmology

## 2023-12-27 DIAGNOSIS — H43813 Vitreous degeneration, bilateral: Secondary | ICD-10-CM

## 2023-12-27 DIAGNOSIS — H35033 Hypertensive retinopathy, bilateral: Secondary | ICD-10-CM

## 2023-12-27 DIAGNOSIS — I10 Essential (primary) hypertension: Secondary | ICD-10-CM | POA: Diagnosis not present

## 2023-12-27 DIAGNOSIS — H353231 Exudative age-related macular degeneration, bilateral, with active choroidal neovascularization: Secondary | ICD-10-CM | POA: Diagnosis not present

## 2024-01-10 ENCOUNTER — Ambulatory Visit: Admitting: Podiatry

## 2024-01-16 ENCOUNTER — Ambulatory Visit: Admitting: Podiatry

## 2024-01-16 ENCOUNTER — Encounter: Payer: Self-pay | Admitting: Podiatry

## 2024-01-16 DIAGNOSIS — E1142 Type 2 diabetes mellitus with diabetic polyneuropathy: Secondary | ICD-10-CM | POA: Diagnosis not present

## 2024-01-16 DIAGNOSIS — M79675 Pain in left toe(s): Secondary | ICD-10-CM

## 2024-01-16 DIAGNOSIS — M79674 Pain in right toe(s): Secondary | ICD-10-CM

## 2024-01-16 DIAGNOSIS — B351 Tinea unguium: Secondary | ICD-10-CM

## 2024-01-16 NOTE — Progress Notes (Signed)
  Subjective:  Patient ID: Patricia Moran, female    DOB: 16-Jul-1929,  MRN: [id number]  Chief Complaint  Patient presents with   [facility]    RFC, no callous.  Not diabetic and takes ASA    89 y.o. female presents with the above complaint. History confirmed with patient. Patient presenting with pain related to dystrophic thickened elongated nails. Patient is unable to trim own nails related to nail dystrophy and/or mobility issues. Patient does have a history of T2DM.  She is unsure of her last A1c.  Objective:  Physical Exam: warm, good capillary refill nail exam normal nails without lesions, onycholysis, and dystrophic nails DP pulses palpable, PT pulses palpable, and protective sensation intact Left Foot:  Pain with palpation of nails due to elongation and dystrophic growth.  Right Foot: Pain with palpation of nails due to elongation and dystrophic growth.   Assessment:   1. Pain due to onychomycosis of toenails of both feet   2. DM type 2 with diabetic peripheral neuropathy (HCC)         Plan:  Patient was evaluated and treated and all questions answered.  #Onychomycosis with pain  -Nails palliatively debrided as below. -Educated on self-care  Procedure: Nail Debridement Rationale: Pain Type of Debridement: manual, sharp debridement. Instrumentation: Nail nipper, rotary burr. Number of Nails: 10  Patient educated on diabetes. Discussed proper diabetic foot care and discussed risks and complications of disease. Educated patient in depth on reasons to return to the office immediately should he/she discover anything concerning or new on the feet. All questions answered. Discussed proper shoes as well.     Return in about 3 months (around 04/16/2024) for Diabetic Foot Care.         Ethan Saddler, DPM [facility] / [facility]

## 2024-01-24 ENCOUNTER — Encounter (INDEPENDENT_AMBULATORY_CARE_PROVIDER_SITE_OTHER): Admitting: Ophthalmology

## 2024-01-24 DIAGNOSIS — H353231 Exudative age-related macular degeneration, bilateral, with active choroidal neovascularization: Secondary | ICD-10-CM | POA: Diagnosis not present

## 2024-01-24 DIAGNOSIS — H43813 Vitreous degeneration, bilateral: Secondary | ICD-10-CM

## 2024-01-24 DIAGNOSIS — H35033 Hypertensive retinopathy, bilateral: Secondary | ICD-10-CM

## 2024-01-24 DIAGNOSIS — I10 Essential (primary) hypertension: Secondary | ICD-10-CM

## 2024-01-31 DIAGNOSIS — Z85820 Personal history of malignant melanoma of skin: Secondary | ICD-10-CM | POA: Diagnosis not present

## 2024-01-31 DIAGNOSIS — L821 Other seborrheic keratosis: Secondary | ICD-10-CM | POA: Diagnosis not present

## 2024-01-31 DIAGNOSIS — L578 Other skin changes due to chronic exposure to nonionizing radiation: Secondary | ICD-10-CM | POA: Diagnosis not present

## 2024-02-05 ENCOUNTER — Encounter (HOSPITAL_BASED_OUTPATIENT_CLINIC_OR_DEPARTMENT_OTHER): Payer: Self-pay | Admitting: Emergency Medicine

## 2024-02-05 ENCOUNTER — Other Ambulatory Visit: Payer: Self-pay

## 2024-02-05 ENCOUNTER — Emergency Department (HOSPITAL_BASED_OUTPATIENT_CLINIC_OR_DEPARTMENT_OTHER)

## 2024-02-05 ENCOUNTER — Emergency Department (HOSPITAL_BASED_OUTPATIENT_CLINIC_OR_DEPARTMENT_OTHER)
Admission: EM | Admit: 2024-02-05 | Discharge: 2024-02-05 | Disposition: A | Discharge status: Home / Self Care | Attending: Emergency Medicine | Admitting: Emergency Medicine

## 2024-02-05 DIAGNOSIS — R42 Dizziness and giddiness: Secondary | ICD-10-CM | POA: Diagnosis not present

## 2024-02-05 DIAGNOSIS — Z79899 Other long term (current) drug therapy: Secondary | ICD-10-CM | POA: Diagnosis not present

## 2024-02-05 DIAGNOSIS — Z7982 Long term (current) use of aspirin: Secondary | ICD-10-CM | POA: Diagnosis not present

## 2024-02-05 DIAGNOSIS — R5383 Other fatigue: Secondary | ICD-10-CM | POA: Insufficient documentation

## 2024-02-05 DIAGNOSIS — I2510 Atherosclerotic heart disease of native coronary artery without angina pectoris: Secondary | ICD-10-CM | POA: Insufficient documentation

## 2024-02-05 DIAGNOSIS — R739 Hyperglycemia, unspecified: Secondary | ICD-10-CM | POA: Diagnosis not present

## 2024-02-05 DIAGNOSIS — I10 Essential (primary) hypertension: Secondary | ICD-10-CM | POA: Insufficient documentation

## 2024-02-05 DIAGNOSIS — I160 Hypertensive urgency: Secondary | ICD-10-CM | POA: Diagnosis not present

## 2024-02-05 LAB — URINALYSIS, ROUTINE W REFLEX MICROSCOPIC
Bilirubin Urine: NEGATIVE
Glucose, UA: NEGATIVE mg/dL
Hgb urine dipstick: NEGATIVE
Ketones, ur: NEGATIVE mg/dL
Leukocytes,Ua: NEGATIVE
Nitrite: NEGATIVE
Protein, ur: NEGATIVE mg/dL
Specific Gravity, Urine: 1.02 (ref 1.005–1.030)
pH: 5.5 (ref 5.0–8.0)

## 2024-02-05 LAB — COMPREHENSIVE METABOLIC PANEL WITH GFR
ALT: 29 U/L (ref 0–44)
AST: 40 U/L (ref 15–41)
Albumin: 4.8 g/dL (ref 3.5–5.0)
Alkaline Phosphatase: 79 U/L (ref 38–126)
Anion gap: 12 (ref 5–15)
BUN: 35 mg/dL — ABNORMAL HIGH (ref 8–23)
CO2: 21 mmol/L — ABNORMAL LOW (ref 22–32)
Calcium: 9.7 mg/dL (ref 8.9–10.3)
Chloride: 106 mmol/L (ref 98–111)
Creatinine, Ser: 1.17 mg/dL — ABNORMAL HIGH (ref 0.44–1.00)
GFR, Estimated: 44 mL/min — ABNORMAL LOW (ref >60)
Glucose, Bld: 117 mg/dL — ABNORMAL HIGH (ref 70–99)
Potassium: 4.8 mmol/L (ref 3.5–5.1)
Sodium: 140 mmol/L (ref 135–145)
Total Bilirubin: 0.3 mg/dL (ref 0.0–1.2)
Total Protein: 7.4 g/dL (ref 6.5–8.1)

## 2024-02-05 LAB — CBC WITH DIFFERENTIAL/PLATELET
Abs Immature Granulocytes: 0.02 K/uL (ref 0.00–0.07)
Basophils Absolute: 0 K/uL (ref 0.0–0.1)
Basophils Relative: 1 %
Eosinophils Absolute: 0.1 K/uL (ref 0.0–0.5)
Eosinophils Relative: 2 %
HCT: 33 % — ABNORMAL LOW (ref 36.0–46.0)
Hemoglobin: 10.4 g/dL — ABNORMAL LOW (ref 12.0–15.0)
Immature Granulocytes: 1 %
Lymphocytes Relative: 34 %
Lymphs Abs: 1.1 K/uL (ref 0.7–4.0)
MCH: 29 pg (ref 26.0–34.0)
MCHC: 31.5 g/dL (ref 30.0–36.0)
MCV: 91.9 fL (ref 80.0–100.0)
Monocytes Absolute: 0.4 K/uL (ref 0.1–1.0)
Monocytes Relative: 11 %
Neutro Abs: 1.8 K/uL (ref 1.7–7.7)
Neutrophils Relative %: 51 %
Platelets: 133 K/uL — ABNORMAL LOW (ref 150–400)
RBC: 3.59 MIL/uL — ABNORMAL LOW (ref 3.87–5.11)
RDW: 14.4 % (ref 11.5–15.5)
WBC: 3.4 K/uL — ABNORMAL LOW (ref 4.0–10.5)
nRBC: 0 % (ref 0.0–0.2)

## 2024-02-05 LAB — RESP PANEL BY RT-PCR (RSV, FLU A&B, COVID)  RVPGX2
Influenza A by PCR: NEGATIVE
Influenza B by PCR: NEGATIVE
Resp Syncytial Virus by PCR: NEGATIVE
SARS Coronavirus 2 by RT PCR: NEGATIVE

## 2024-02-05 LAB — TROPONIN T, HIGH SENSITIVITY: Troponin T High Sensitivity: 23 ng/L — ABNORMAL HIGH (ref 0–19)

## 2024-02-05 LAB — CBG MONITORING, ED: Glucose-Capillary: 110 mg/dL — ABNORMAL HIGH (ref 70–99)

## 2024-02-05 MED ORDER — SODIUM CHLORIDE 0.9 % IV BOLUS
1000.0000 mL | Freq: Once | INTRAVENOUS | Status: AC
  Administered 2024-02-05: 1000 mL via INTRAVENOUS

## 2024-02-05 NOTE — ED Notes (Signed)
 Pt alert and oriented X 4 at the time of discharge. RR even and unlabored. No acute distress noted. Pt verbalized understanding of discharge instructions as discussed. Pt ambulatory to lobby at time of discharge.

## 2024-02-05 NOTE — ED Notes (Signed)
 ED Provider at bedside.

## 2024-02-05 NOTE — ED Triage Notes (Signed)
 Pt with dizziness, fatigue, elevated blood sugar since yesterday; denies pain

## 2024-02-05 NOTE — ED Provider Notes (Signed)
 [place] [facility] Provider Note   CSN: [id number] Arrival date & time: 02/05/24  1329     Patient presents with: Dizziness   Patricia Moran is a 89 y.o. female w/ hx of CAD, aortic stenosis, HTN, HLD, presenting to the ED with complaint of dizziness, fatigue since yesterday. Her daughter reports patient's had some fatigue with exertion for weeks likely, but still lives alone and can get around the house with a walker.  Today the patient felt too tired to go to church, which was unusual for her. The patent denies to me dizziness, chest pain, SOB, or pain anywhere. She states she feels fine, just tired.  NO recent chills, fevers.  Cardiolgoy eval from 09/09/23 Dr Pietro in office noting aortic stenosis was stable but May require TAVR in the future. Echo 10/20/23 noting severe AS, EF 60-65%   HPI     Prior to Admission medications   Medication Sig Start Date End Date Taking? Authorizing Provider  acetaZOLAMIDE ER (DIAMOX) 500 MG capsule Take 500 mg by mouth every 12 (twelve) hours. 11/09/20   [provider]  allopurinol  (ZYLOPRIM ) 100 MG tablet Take 50 mg by mouth daily.     [provider]  amLODipine  (NORVASC ) 5 MG tablet Take 1 tablet (5 mg total) by mouth daily. 10/27/23   Pietro Redell RAMAN, MD  aspirin  81 MG tablet Take 81 mg by mouth daily.      [provider]  Bevacizumab  (AVASTIN  IV) Inject into the vein. Injection in left eye. Every 4 weeks.    [provider]  bisoprolol  (ZEBETA ) 5 MG tablet Take 2.5 mg by mouth 2 (two) times daily.    [provider]  Blood Glucose Monitoring Suppl (ONE TOUCH ULTRA 2) w/Device KIT USE AS DIRECTED BY PHYSICIAN FOR GLUCOSE MONITORING    [provider]  brimonidine (ALPHAGAN) 0.2 % ophthalmic solution 1 drop into affected eye    [provider]  Calcium Carbonate (CALTRATE 600 PO) Take 100 mg by mouth at bedtime.     [provider]   Dextran 70-Hypromellose (ARTIFICIAL TEARS) 0.1-0.3 % SOLN Ophthalmic as needed    [provider]  diazepam  (VALIUM ) 5 MG tablet Take 5 mg by mouth as needed (as needed for eye injection).     [provider]  dorzolamide (TRUSOPT) 2 % ophthalmic solution 1 drop into affected eye    [provider]  levothyroxine (SYNTHROID) 25 MCG tablet Take 25 mcg by mouth every morning.    [provider]  lisinopril  (ZESTRIL ) 20 MG tablet TAKE 1 TABLET BY MOUTH EVERY DAY 06/09/23   Pietro Redell RAMAN, MD  moxifloxacin (VIGAMOX) 0.5 % ophthalmic solution Place 1 drop into both eyes as needed. Takes with injection in the eye    [provider]  Multiple Vitamins-Minerals (OCUVITE PRESERVISION) TABS Take 1 tablet by mouth 2 (two) times a day.     [provider]  Pediatric Multiple Vitamins (FLINSTONES GUMMIES OMEGA-3 DHA) CHEW Chew by mouth.    [provider]  Pediatric Multivitamins-Iron (FLINTSTONES PLUS IRON PO) Flintstones Plus Iron    [provider]  pioglitazone  (ACTOS ) 15 MG tablet Take 2 tablets (30 mg total) by mouth daily. SEE COMMENT 07/01/11   Dunn, Dayna N, PA-C  simvastatin  (ZOCOR ) 40 MG tablet take 1 tablet by mouth in the evening once a day    [provider]    Allergies: Cephalexin, Gatifloxacin, Hydrocodone, Nitroglycerin , Nitroglycerin   er, Ofloxacin, Sulfonamide derivatives, and Terbinafine    Review of Systems  Updated Vital Signs BP (!) 140/52   Pulse 68   Temp 97.8 F (36.6 C)   Resp 17   Ht 5' 1.5 (1.562 m)   Wt 59.8 kg   SpO2 97%   BMI 24.50 kg/m   Physical Exam Constitutional:      General: She is not in acute distress. HENT:     Head: Normocephalic and atraumatic.  Eyes:     Conjunctiva/sclera: Conjunctivae normal.     Pupils: Pupils are equal, round, and reactive to light.  Cardiovascular:     Rate and Rhythm: Normal rate and regular rhythm.     Heart sounds: Murmur heard.   Pulmonary:     Effort: Pulmonary effort is normal. No respiratory distress.  Abdominal:     General: There is no distension.     Tenderness: There is no abdominal tenderness.  Skin:    General: Skin is warm and dry.  Neurological:     General: No focal deficit present.     Mental Status: She is alert. Mental status is at baseline.  Psychiatric:        Mood and Affect: Mood normal.        Behavior: Behavior normal.     (all labs ordered are listed, but only abnormal results are displayed) Labs Reviewed  COMPREHENSIVE METABOLIC PANEL WITH GFR - Abnormal; Notable for the following components:      Result Value   CO2 21 (*)    Glucose, Bld 117 (*)    BUN 35 (*)    Creatinine, Ser 1.17 (*)    GFR, Estimated 44 (*)    All other components within normal limits  CBC WITH DIFFERENTIAL/PLATELET - Abnormal; Notable for the following components:   WBC 3.4 (*)    RBC 3.59 (*)    Hemoglobin 10.4 (*)    HCT 33.0 (*)    Platelets 133 (*)    All other components within normal limits  URINALYSIS, ROUTINE W REFLEX MICROSCOPIC - Abnormal; Notable for the following components:   APPearance HAZY (*)    All other components within normal limits  CBG MONITORING, ED - Abnormal; Notable for the following components:   Glucose-Capillary 110 (*)    All other components within normal limits  TROPONIN T, HIGH SENSITIVITY - Abnormal; Notable for the following components:   Troponin T High Sensitivity 23 (*)    All other components within normal limits  RESP PANEL BY RT-PCR (RSV, FLU A&B, COVID)  RVPGX2  TROPONIN T, HIGH SENSITIVITY    EKG: EKG Interpretation Date/Time:  Sunday February 05 2024 13:57:53 EDT Ventricular Rate:  63 PR Interval:  177 QRS Duration:  144 QT Interval:  436 QTC Calculation: 447 R Axis:   96  Text Interpretation: Sinus rhythm with PACs RBBB and LPFB Similar to prior tracings although PACs appear new Confirmed by Darra Chew [phone]) on 02/05/2024 2:35:44  PM  Radiology: [facility] Chest 2 View Result Date: 02/05/2024 CLINICAL DATA:  Fatigue. EXAM: CHEST - 2 VIEW COMPARISON:  Chest radiograph dated 11/09/2018. FINDINGS: No focal consolidation, pleural effusion or pneumothorax. Stable cardiac silhouette. No acute osseous pathology. Degenerative changes of the spine. IMPRESSION: No active cardiopulmonary disease. Electronically Signed   By: Vanetta Chou M.D.   On: 02/05/2024 14:18     Procedures   Medications Ordered in the ED  sodium chloride  0.9 % bolus 1,000 mL (0 mLs Intravenous Stopped 02/05/24 1738)  Clinical Course as of 02/05/24 1742  Sun Feb 05, 2024  1615 Ortho VS positive, BP 125-> 90 with standing.  Ordered 1 liter IVF [MT]  1736 Patient is feeling much better after her liter of fluids and was able to ambulate steadily.  Blood pressure appears to have improved.  I do suspect that this was related to some likely dehydration, but I encouraged her and her family to still follow-up with a cardiologist given her known severe aortic stenosis.  They verbalized agreement.  She is stable for discharge [MT]    Clinical Course User Index [MT] Zailynn Brandel, Donnice PARAS, MD                                 Medical Decision Making Amount and/or Complexity of Data Reviewed Labs: ordered. Radiology: ordered.   This patient presents to the ED with concern for near syncope. This involves an extensive number of treatment options, and is a complaint that carries with it a high risk of complications and morbidity.  The differential diagnosis includes arrhythmia versus dehydration versus anemia versus atypical ACS versus other  Symptomatic aortic stenosis is also possibility.  It is difficult to discern whether she has truly had gradual worsening of her dyspnea as she has not very active at baseline, although she is able to get around the house.  She has not had syncope, or evidence of heart failure, and I do not think that she needs to be hospitalized for  this issue at this time.  I would recommend that if she is cleared for discharge at this time that she follow-up with her cardiologist, and she and her daughter are both in agreement with that.  Co-morbidities that complicate the patient evaluation: Advanced age, history of cardiac disease, aortic stenosis  Additional history obtained from patient's family at the bedside  External records from outside source obtained and reviewed including most recent echocardiogram and cardiology records  I ordered and personally interpreted labs.  The pertinent results include: No emergent findings.  Labs are at baseline levels.  Minor elevation of BUN.  UA without evidence of infection  I ordered imaging studies including x-ray of the chest I independently visualized and interpreted imaging which showed no emergent findings I agree with the radiologist interpretation  Per my interpretation the patient's ECG shows no acute ischemic findings  I ordered medication including IV fluid bolus for positive orthostatic vital signs  I have reviewed the patients home medicines and have made adjustments as needed  Test Considered: Lower suspicion for acute PE, sepsis, intra-abdominal infection or emergency  After the interventions noted above, I reevaluated the patient and found that they have: improved  Patient reportedly does not drink much water at home and I did encourage her to drink more fluids, given her positive orthostatic vital signs.  She and her family were in agreement with this.  Dispostion:  After consideration of the diagnostic results and the patients response to treatment, I feel that the patent would benefit from close outpatient follow-up.      Final diagnoses:  Dizziness    ED Discharge Orders     None          Cottie Donnice PARAS, MD 02/05/24 1742

## 2024-02-07 ENCOUNTER — Ambulatory Visit: Attending: Physician Assistant | Admitting: Physician Assistant

## 2024-02-07 VITALS — BP 138/54 | HR 65 | Ht 61.0 in | Wt 133.0 lb

## 2024-02-07 DIAGNOSIS — I2510 Atherosclerotic heart disease of native coronary artery without angina pectoris: Secondary | ICD-10-CM | POA: Diagnosis not present

## 2024-02-07 DIAGNOSIS — R5383 Other fatigue: Secondary | ICD-10-CM | POA: Diagnosis not present

## 2024-02-07 DIAGNOSIS — E785 Hyperlipidemia, unspecified: Secondary | ICD-10-CM

## 2024-02-07 DIAGNOSIS — I351 Nonrheumatic aortic (valve) insufficiency: Secondary | ICD-10-CM

## 2024-02-07 DIAGNOSIS — I10 Essential (primary) hypertension: Secondary | ICD-10-CM

## 2024-02-07 NOTE — Progress Notes (Unsigned)
 Cardiology Office Note   Date:  02/07/2024  ID:  Patricia Moran, DOB 16-Feb-1930, MRN [id number] PCP: Royden Ronal Czar, FNP  [facility] Providers Cardiologist:  Redell Shallow, MD { Click to update primary MD,subspecialty MD or APP then REFRESH:1}    History of Present Illness Patricia Moran is a 89 y.o. female with past medical history of CAD, aortic stenosis, hypertension, hyperlipidemia and mild carotid artery disease.  Patient has been seen in the past for chest pain and near syncope.  Heart monitor performed in November 2011 for syncope showed sinus rhythm with PACs.  Cardiac catheterization performed on 06/30/2011 showed 40 to 50% diffuse LAD lesion, less than 10% luminal irregularities in the left circumflex artery, 50 to 60% mid RCA lesion, EF 55 to 65%.  Medical therapy was recommended.  Carotid Doppler in January 2016 showed no significant disease.  Echocardiogram in April 2024 showed normal EF, grade 2 DD, mild MR, moderate aortic insufficiency and probable moderate aortic stenosis.  Patient was last seen by Dr. Shallow in May 2025 at which time she denied any increased dyspnea or syncope.  She did have vague pain in the left chest and under her left breast that is relieved with nitroglycerin .  Symptom lasted about 15 minutes.  Subsequent echocardiogram obtained 11/16/2023 showed EF 60 to 65%, no regional wall motion abnormality, grade 1 DD, RVSP 35.5 mmHg, severe LAE, moderate MR, moderate to severe AI.  40-month follow-up was recommended.  More recently, patient presented to the emergency room 2 days ago with dizziness and fatigue.  Family mention she has been fatigued with exertion for weeks.  Viral panel was negative for COVID and RSV.  Renal function and electrolytes stable.  Urinalysis showed hazy appearance but in negative nitrite or leukocyte.  Hemoglobin stable at 10.4, white blood cell count borderline low at 3.4.  Troponin 23.  Chest x-ray showed no acute finding.  Patient was  given a liter of fluid.  Patient presents today for follow-up accompanied by daughter.  She lives by herself.  She is 27 years old.  Her daughter manage her medication.  She feels like she has slight increase in the fatigue, however this is not dramatically changed.  She was found to have be dehydrated when she went to the emergency room and was given IV fluid.  Family has encouraged her to eat more and drink more fluid.  She is currently eating 2 meals per day.  She suspect she is mostly dizzy when she changed body position however cannot be sure.  She does not do a whole lot of activity at home.  She previously had chest pain that is relieved by aspirin , she still keep aspirin  on hand, however has not needed to take aspirin  for over a month.  Although troponin is borderline elevated at 23, however this can occur in the setting of dehydration.  Given lack of chest discomfort and her advanced age, I do not think she need ischemic workup at this time.  There is no clear indication that her symptom is truly related to underlying valve issue.  She still has crisp S1 and S2 on physical exam.  I decided to hold off on referring her for TAVR workup.  She has follow-up with Dr. Shallow next month for reassessment of her symptom.  ROS: ***  Studies Reviewed      *** Risk Assessment/Calculations {Does this patient have ATRIAL FIBRILLATION?[phone]}         Physical Exam VS:  BP (!) 138/54   Pulse 65   Ht 5' 1 (1.549 m)   Wt 133 lb (60.3 kg)   SpO2 96%   BMI 25.13 kg/m        Wt Readings from Last 3 Encounters:  02/07/24 133 lb (60.3 kg)  02/05/24 131 lb 12.8 oz (59.8 kg)  09/09/23 131 lb (59.4 kg)    GEN: Well nourished, well developed in no acute distress NECK: No JVD; No carotid bruits CARDIAC: ***RRR, no murmurs, rubs, gallops RESPIRATORY:  Clear to auscultation without rales, wheezing or rhonchi  ABDOMEN: Soft, non-tender, non-distended EXTREMITIES:  No edema; No deformity    ASSESSMENT AND PLAN ***    {Are you ordering a CV Procedure (e.g. stress test, cath, DCCV, TEE, etc)?   Press F2        :[id number]}  Dispo: ***  Signed, Scot Ford, PA

## 2024-02-07 NOTE — Patient Instructions (Signed)
 Medication Instructions:    Your physician recommends that you continue on your current medications as directed. Please refer to the Current Medication list given to you today.   *If you need a refill on your cardiac medications before your next appointment, please call your pharmacy*    Lab Work:  NONE ORDERED  TODAY    If you have labs (blood work) drawn today and your tests are completely normal, you will receive your results only by: MyChart Message (if you have MyChart) OR A paper copy in the mail If you have any lab test that is abnormal or we need to change your treatment, we will call you to review the results.    Testing/Procedures: NONE ORDERED  TODAY    Follow-Up: At [facility], you and your health needs are our priority.  As part of our continuing mission to provide you with exceptional heart care, our providers are all part of one team.  This team includes your primary Cardiologist (physician) and Advanced Practice Providers or APPs (Physician Assistants and Nurse Practitioners) who all work together to provide you with the care you need, when you need it.   Your next appointment:  AS SCHEDULE    We recommend signing up for the patient portal called MyChart.  Sign up information is provided on this After Visit Summary.  MyChart is used to connect with patients for Virtual Visits (Telemedicine).  Patients are able to view lab/test results, encounter notes, upcoming appointments, etc.  Non-urgent messages can be sent to your provider as well.   To learn more about what you can do with MyChart, go to [email or web address].   Other Instructions

## 2024-02-24 DIAGNOSIS — Z961 Presence of intraocular lens: Secondary | ICD-10-CM | POA: Diagnosis not present

## 2024-02-24 DIAGNOSIS — H401131 Primary open-angle glaucoma, bilateral, mild stage: Secondary | ICD-10-CM | POA: Diagnosis not present

## 2024-02-26 ENCOUNTER — Other Ambulatory Visit: Payer: Self-pay | Admitting: Cardiology

## 2024-02-28 ENCOUNTER — Encounter (INDEPENDENT_AMBULATORY_CARE_PROVIDER_SITE_OTHER): Admitting: Ophthalmology

## 2024-02-28 DIAGNOSIS — H353231 Exudative age-related macular degeneration, bilateral, with active choroidal neovascularization: Secondary | ICD-10-CM | POA: Diagnosis not present

## 2024-02-28 DIAGNOSIS — I10 Essential (primary) hypertension: Secondary | ICD-10-CM

## 2024-02-28 DIAGNOSIS — H35033 Hypertensive retinopathy, bilateral: Secondary | ICD-10-CM | POA: Diagnosis not present

## 2024-02-28 DIAGNOSIS — H43813 Vitreous degeneration, bilateral: Secondary | ICD-10-CM

## 2024-03-01 NOTE — Progress Notes (Signed)
 HPI: FU CAD and AS; seen in the past for chest pain and near syncope. CardioNet monitor was performed in November of 2011 related to syncope and revealed sinus with pacs. LHC 06/30/11: LAD diffuse 40-50%, circumflex irregular with less than 10% stenosis, mid RCA 50-60%, EF 55-65%. Medical therapy was recommended. Carotid dopplers January 2016 showed no significant stenosis. Echocardiogram July 2025 showed normal LV function, grade 1 diastolic dysfunction, severe left atrial enlargement, moderate mitral regurgitation, moderate to severe aortic stenosis with mean gradient 20 mmHg, dimensionless index 0.24 and aortic valve area calculated at 0.71 cm, trace aortic insufficiency.  Since she was last seen, she has dyspnea with more vigorous activities.  No orthopnea, PND, pedal edema, chest pain or syncope.  Her symptoms are unchanged compared to previous.  Current Outpatient Medications  Medication Sig Dispense Refill   acetaZOLAMIDE ER (DIAMOX) 500 MG capsule Take 500 mg by mouth every 12 (twelve) hours.     allopurinol  (ZYLOPRIM ) 100 MG tablet Take 50 mg by mouth daily.      amLODipine  (NORVASC ) 5 MG tablet Take 1 tablet (5 mg total) by mouth daily. 90 tablet 2   aspirin  81 MG tablet Take 81 mg by mouth daily.       Bevacizumab  (AVASTIN  IV) Inject into the vein. Injection in left eye. Every 4 weeks.     bisoprolol  (ZEBETA ) 5 MG tablet Take 2.5 mg by mouth 2 (two) times daily.     Blood Glucose Monitoring Suppl (ONE TOUCH ULTRA 2) w/Device KIT USE AS DIRECTED BY PHYSICIAN FOR GLUCOSE MONITORING     brimonidine (ALPHAGAN) 0.2 % ophthalmic solution 1 drop into affected eye     Calcium Carbonate (CALTRATE 600 PO) Take 100 mg by mouth at bedtime.      Dextran 70-Hypromellose (ARTIFICIAL TEARS) 0.1-0.3 % SOLN Ophthalmic as needed     diazepam  (VALIUM ) 5 MG tablet Take 5 mg by mouth as needed (as needed for eye injection).      dorzolamide (TRUSOPT) 2 % ophthalmic solution 1 drop into affected eye      levothyroxine (SYNTHROID) 25 MCG tablet Take 25 mcg by mouth every morning.     lisinopril  (ZESTRIL ) 20 MG tablet TAKE 1 TABLET BY MOUTH EVERY DAY 90 tablet 2   moxifloxacin (VIGAMOX) 0.5 % ophthalmic solution Place 1 drop into both eyes as needed. Takes with injection in the eye     Multiple Vitamins-Minerals (OCUVITE PRESERVISION) TABS Take 1 tablet by mouth 2 (two) times a day.      Pediatric Multiple Vitamins (FLINSTONES GUMMIES OMEGA-3 DHA) CHEW Chew by mouth.     Pediatric Multivitamins-Iron (FLINTSTONES PLUS IRON PO) Flintstones Plus Iron     pioglitazone  (ACTOS ) 15 MG tablet Take 2 tablets (30 mg total) by mouth daily. SEE COMMENT     simvastatin  (ZOCOR ) 40 MG tablet take 1 tablet by mouth in the evening once a day     No current facility-administered medications for this visit.     Past Medical History:  Diagnosis Date   Anemia    Felt secondary renal insuffiency, seen by Dr. Townsend - Workup in the past has included normal iron, folate, B12, serum and urine protein electrophoresis as well as an ANA. Peripheral blood smear had been normal   Carotid arterial disease    Last dopplers 12/2010 - RICA 40-59%, LICA 0-39% for recheck in 1 year   Coronary artery disease    Moderate nonobstructive disease by cath 06/2011 (ruled out for  MI/PE at that time)   Dyslipidemia    Esophageal reflux    Gout    Hypertensive retinopathy    Macular degeneration (senile) of retina, unspecified    Osteoporosis    Other and unspecified hyperlipidemia    Pancytopenia 04/19/2008   Intermittent mild leukopenia and thrombocytopenia felt to be most likely due to immune dysregulation per Dr. Townsend   Type II or unspecified type diabetes mellitus without mention of complication, not stated as uncontrolled    Unspecified essential hypertension     Past Surgical History:  Procedure Laterality Date   abdominal cyst     ABDOMINAL HYSTERECTOMY     cyst removal   APPENDECTOMY     age 28   CATARACT  EXTRACTION     EYE SURGERY     LEFT HEART CATHETERIZATION WITH CORONARY ANGIOGRAM N/A 06/30/2011   Procedure: LEFT HEART CATHETERIZATION WITH CORONARY ANGIOGRAM;  Surgeon: Peter M Jordan, MD;  Location: [facility] CATH LAB;  Service: Cardiovascular;  Laterality: N/A;    Social History   Socioeconomic History   Marital status: Widowed    Spouse name: Not on file   Number of children: Not on file   Years of education: Not on file   Highest education level: Not on file  Occupational History   Not on file  Tobacco Use   Smoking status: Never   Smokeless tobacco: Never  Vaping Use   Vaping status: Never Used  Substance and Sexual Activity   Alcohol  use: No   Drug use: No   Sexual activity: Never    Birth control/protection: Post-menopausal  Other Topics Concern   Not on file  Social History Narrative   Lives in [place] and lives alone, 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    Social Drivers of Corporate Investment Banker Strain: Not on file  Food Insecurity: Not on file  Transportation Needs: Not on file  Physical Activity: Not on file  Stress: Not on file  Social Connections: Not on file  Intimate Partner Violence: Not on file    Family History  Problem Relation Age of Onset   Heart attack Father        in his 32's.   Coronary artery disease Other        younger siblings   Cancer Maternal Aunt        GYN    ROS: no fevers or chills, productive cough, hemoptysis, dysphasia, odynophagia, melena, hematochezia, dysuria, hematuria, rash, seizure activity, orthopnea, PND, pedal edema, claudication. Remaining systems are negative.  Physical Exam: Well-developed well-nourished in no acute distress.  Skin is warm and dry.  HEENT is normal.  Neck is supple.  Chest is clear to auscultation with normal expansion.  Cardiovascular exam is regular rate and rhythm.  3/6 systolic murmur left sternal border. Abdominal exam nontender or distended. No masses palpated. Extremities show no  edema. neuro grossly intact  EKG Interpretation Date/Time:  Monday March 12 2024 11:38:14 EST Ventricular Rate:  60 PR Interval:  168 QRS Duration:  126 QT Interval:  450 QTC Calculation: 450 R Axis:   103  Text Interpretation: Sinus rhythm with Premature atrial complexes Right bundle branch block Confirmed by Pietro Rogue ([id number]) on 03/12/2024 11:39:05 AM    A/P  1 aortic stenosis-moderate to severe on most recent echocardiogram.  Patient has dyspnea with more prolonged activities but not routine activities and denies chest pain or syncope.  Her symptoms are unchanged compared to last office visit.  We discussed  the progressive nature of aortic stenosis and that she may require TAVR in the future.  At this point she feels as though she would like to pursue that in the future if necessary though this may change as she ages.  Plan repeat echocardiogram July 2026 or sooner if she develops symptoms.  She will follow-up with me in 6 months.    2 coronary artery disease-she denies chest pain.  Plan to continue medical therapy with aspirin  and statin.  3 hypertension-blood pressure controlled.  Continue present medications.  4 hyperlipidemia-continue statin.  5 carotid artery disease-minimal on most recent Dopplers.  Redell Shallow, MD

## 2024-03-12 ENCOUNTER — Encounter: Payer: Self-pay | Admitting: Cardiology

## 2024-03-12 ENCOUNTER — Ambulatory Visit: Attending: Cardiology | Admitting: Cardiology

## 2024-03-12 VITALS — BP 140/40 | HR 62 | Ht 61.0 in | Wt 131.6 lb

## 2024-03-12 DIAGNOSIS — I10 Essential (primary) hypertension: Secondary | ICD-10-CM | POA: Diagnosis not present

## 2024-03-12 DIAGNOSIS — E785 Hyperlipidemia, unspecified: Secondary | ICD-10-CM

## 2024-03-12 DIAGNOSIS — I2510 Atherosclerotic heart disease of native coronary artery without angina pectoris: Secondary | ICD-10-CM | POA: Diagnosis not present

## 2024-03-12 DIAGNOSIS — I350 Nonrheumatic aortic (valve) stenosis: Secondary | ICD-10-CM | POA: Diagnosis not present

## 2024-03-12 NOTE — Patient Instructions (Signed)

## 2024-03-15 DIAGNOSIS — M25551 Pain in right hip: Secondary | ICD-10-CM | POA: Diagnosis not present

## 2024-03-15 DIAGNOSIS — I10 Essential (primary) hypertension: Secondary | ICD-10-CM | POA: Diagnosis not present

## 2024-03-15 DIAGNOSIS — M25572 Pain in left ankle and joints of left foot: Secondary | ICD-10-CM | POA: Diagnosis not present

## 2024-03-15 DIAGNOSIS — W19XXXA Unspecified fall, initial encounter: Secondary | ICD-10-CM | POA: Diagnosis not present

## 2024-04-03 ENCOUNTER — Encounter (INDEPENDENT_AMBULATORY_CARE_PROVIDER_SITE_OTHER): Admitting: Ophthalmology

## 2024-04-03 DIAGNOSIS — H353231 Exudative age-related macular degeneration, bilateral, with active choroidal neovascularization: Secondary | ICD-10-CM | POA: Diagnosis not present

## 2024-04-03 DIAGNOSIS — I10 Essential (primary) hypertension: Secondary | ICD-10-CM | POA: Diagnosis not present

## 2024-04-03 DIAGNOSIS — H35033 Hypertensive retinopathy, bilateral: Secondary | ICD-10-CM | POA: Diagnosis not present

## 2024-04-03 DIAGNOSIS — H43813 Vitreous degeneration, bilateral: Secondary | ICD-10-CM | POA: Diagnosis not present

## 2024-04-17 ENCOUNTER — Encounter: Payer: Self-pay | Admitting: Podiatry

## 2024-04-17 ENCOUNTER — Ambulatory Visit (INDEPENDENT_AMBULATORY_CARE_PROVIDER_SITE_OTHER): Admitting: Podiatry

## 2024-04-17 DIAGNOSIS — M79675 Pain in left toe(s): Secondary | ICD-10-CM

## 2024-04-17 DIAGNOSIS — B351 Tinea unguium: Secondary | ICD-10-CM

## 2024-04-17 DIAGNOSIS — E1142 Type 2 diabetes mellitus with diabetic polyneuropathy: Secondary | ICD-10-CM

## 2024-04-17 DIAGNOSIS — M79674 Pain in right toe(s): Secondary | ICD-10-CM

## 2024-04-17 NOTE — Progress Notes (Signed)
"  °  Subjective:  Patient ID: Patricia Moran, female    DOB: [birth year],  MRN: [id number]  Chief Complaint  Patient presents with   [facility]    [facility] no callus A1c was 5.7 in July, goes every 6 months ASA    89 y.o. female presents with the above complaint. History confirmed with patient. Patient presenting with pain related to dystrophic thickened elongated nails. Patient is unable to trim own nails related to nail dystrophy and/or mobility issues. Patient does have a history of T2DM. Accompanied by daughter today. Last A1c 5.7 in July.  Objective:  Physical Exam: warm, good capillary refill, pedal skin atrophic Onychomycosis of toenails x 10 with nail dystrophy greater than 3 mm thickening, yellow discoloration, increased elongation. DP pulses palpable, PT pulses palpable, and protective sensation intact Left Foot:  Pain with palpation of nails due to elongation and dystrophic growth.  Right Foot: Pain with palpation of nails due to elongation and dystrophic growth.  Mild buildup of dried dead skin 3rd and 4th interspaces bilaterally, no signs of infection or maceration   Assessment:   1. Pain due to onychomycosis of toenails of both feet   2. DM type 2 with diabetic peripheral neuropathy (HCC)         Plan:  Patient was evaluated and treated and all questions answered.  #Onychomycosis with pain  -Nails palliatively debrided as below. -Educated on self-care  Procedure: Nail Debridement Rationale: Pain Type of Debridement: manual, sharp debridement. Instrumentation: Nail nipper, rotary burr. Number of Nails: 10   Patient educated on diabetes. Discussed proper diabetic foot care with home foot checks and discussed risks and complications of disease. Educated patient in depth on reasons to return to the office immediately should he/she discover anything concerning or new on the feet. All questions answered. Discussed proper shoes as well.      Return in about 3 months (around  07/16/2024) for Diabetic Foot Care.         Ethan Saddler, DPM [facility]                    "

## 2024-05-02 LAB — LAB REPORT - SCANNED: EGFR: 31

## 2024-05-07 ENCOUNTER — Encounter (INDEPENDENT_AMBULATORY_CARE_PROVIDER_SITE_OTHER): Admitting: Ophthalmology

## 2024-05-07 DIAGNOSIS — I10 Essential (primary) hypertension: Secondary | ICD-10-CM | POA: Diagnosis not present

## 2024-05-07 DIAGNOSIS — H35033 Hypertensive retinopathy, bilateral: Secondary | ICD-10-CM | POA: Diagnosis not present

## 2024-05-07 DIAGNOSIS — H43813 Vitreous degeneration, bilateral: Secondary | ICD-10-CM | POA: Diagnosis not present

## 2024-05-07 DIAGNOSIS — H353231 Exudative age-related macular degeneration, bilateral, with active choroidal neovascularization: Secondary | ICD-10-CM | POA: Diagnosis not present

## 2024-06-11 ENCOUNTER — Encounter (INDEPENDENT_AMBULATORY_CARE_PROVIDER_SITE_OTHER): Admitting: Ophthalmology

## 2024-07-17 ENCOUNTER — Ambulatory Visit: Admitting: Podiatry

## 2024-09-13 ENCOUNTER — Ambulatory Visit: Admitting: Cardiology
